# Patient Record
Sex: Female | Born: 1950 | Race: Black or African American | Hispanic: No | Smoking: Former smoker
Health system: Southern US, Community
[De-identification: ages and names within clinical notes are randomized; demographics above are authoritative.]

## PROBLEM LIST (undated history)

## (undated) DIAGNOSIS — D649 Anemia, unspecified: Secondary | ICD-10-CM

## (undated) DIAGNOSIS — E213 Hyperparathyroidism, unspecified: Secondary | ICD-10-CM

## (undated) DIAGNOSIS — E669 Obesity, unspecified: Secondary | ICD-10-CM

## (undated) DIAGNOSIS — F32A Depression, unspecified: Secondary | ICD-10-CM

## (undated) DIAGNOSIS — I739 Peripheral vascular disease, unspecified: Secondary | ICD-10-CM

## (undated) DIAGNOSIS — E785 Hyperlipidemia, unspecified: Secondary | ICD-10-CM

## (undated) DIAGNOSIS — F419 Anxiety disorder, unspecified: Secondary | ICD-10-CM

## (undated) DIAGNOSIS — I10 Essential (primary) hypertension: Secondary | ICD-10-CM

## (undated) DIAGNOSIS — I639 Cerebral infarction, unspecified: Secondary | ICD-10-CM

## (undated) DIAGNOSIS — R41841 Cognitive communication deficit: Secondary | ICD-10-CM

## (undated) DIAGNOSIS — M6281 Muscle weakness (generalized): Secondary | ICD-10-CM

## (undated) DIAGNOSIS — G8190 Hemiplegia, unspecified affecting unspecified side: Secondary | ICD-10-CM

## (undated) DIAGNOSIS — E119 Type 2 diabetes mellitus without complications: Secondary | ICD-10-CM

## (undated) DIAGNOSIS — I5032 Chronic diastolic (congestive) heart failure: Secondary | ICD-10-CM

## (undated) DIAGNOSIS — N189 Chronic kidney disease, unspecified: Secondary | ICD-10-CM

## (undated) HISTORY — PX: HERNIA REPAIR: SHX51

---

## 2000-06-06 ENCOUNTER — Ambulatory Visit: Admission: RE | Admit: 2000-06-06 | Discharge: 2000-06-06 | Payer: Self-pay | Admitting: Family Medicine

## 2007-12-05 ENCOUNTER — Ambulatory Visit: Payer: Self-pay | Admitting: Pulmonary Disease

## 2007-12-05 ENCOUNTER — Inpatient Hospital Stay (HOSPITAL_COMMUNITY): Admission: AD | Admit: 2007-12-05 | Discharge: 2007-12-13 | Payer: Self-pay | Admitting: Internal Medicine

## 2007-12-05 ENCOUNTER — Encounter: Payer: Self-pay | Admitting: Emergency Medicine

## 2007-12-09 ENCOUNTER — Encounter (INDEPENDENT_AMBULATORY_CARE_PROVIDER_SITE_OTHER): Payer: Self-pay | Admitting: *Deleted

## 2007-12-11 ENCOUNTER — Ambulatory Visit: Payer: Self-pay | Admitting: Physical Medicine & Rehabilitation

## 2007-12-13 ENCOUNTER — Inpatient Hospital Stay (HOSPITAL_COMMUNITY)
Admission: RE | Admit: 2007-12-13 | Discharge: 2007-12-26 | Payer: Self-pay | Admitting: Physical Medicine & Rehabilitation

## 2007-12-13 ENCOUNTER — Ambulatory Visit: Payer: Self-pay | Admitting: Physical Medicine & Rehabilitation

## 2008-01-18 ENCOUNTER — Encounter
Admission: RE | Admit: 2008-01-18 | Discharge: 2008-01-18 | Payer: Self-pay | Admitting: Physical Medicine & Rehabilitation

## 2011-05-10 NOTE — Discharge Summary (Signed)
Angela Hays, Angela Hays NO.:  [id number]   MEDICAL RECORD NO.:  [id number]          PATIENT TYPE:  INP   LOCATION:  3032                         FACILITY:  [facility]   PHYSICIAN:  Angela Noel, MD      DATE OF BIRTH:  1951-11-30   DATE OF ADMISSION:  12/05/2007  DATE OF DISCHARGE:                               DISCHARGE SUMMARY   DATE OF CRITICAL CARE SIGN OFF:  December 09, 2007.   DISCHARGE DIAGNOSIS:  1. All hypertensive emergency  2. Ischemic stroke.  3. Diabetes type 2.  4. Debility   CONSULTATIONS:  Neurosurgery with Angela Hays, M.D. and Angela Saas, MD with neurology   LABORATORY DATA:  Date of December 09, 2007 homocystine level 12.50,  hemoglobin at 14.4, hematocrit 44, white blood cell count 9.5, platelet  78, cholesterol 181, LDL 125, sodium 140, potassium 2.8, chloride 102,  CO2 28, glucose 217, BUN 31, creatinine 1.10.  Date of December 07, 2007, TSH 1.99.  Date of December 05, 2007, urine drug screen negative  for any nonprescription or prescription narcotics or benzodiazepines.   RADIOLOGY:  MRI/MRA of brain demonstrating extensive and widespread  chronic small vessel insults throughout the brain.  These were seen  within the pons, thalami, basal ganglia, and hemispheric white matter  diffusely.  There is old stroke in the deep insular cortex on the left  with calcification, volume loss, no evidence of hemorrhage.  MRA  demonstrates widespread intracranial arteriosclerotic irregularity of  the medium sized vessel, most notably the anterior cerebral circulation.   BRIEF HISTORY:  This is a 60 year old female who presented to [facility]  on December 05, 2007 with chief complaint of right greater than left leg  weakness, gait ataxia, and loss of balance.  She first noted these  symptoms on December 02, 2007.  She denied headache, nausea, slurred  speech, or upper extremity strength deficit at that time.  She was  evaluated in [place]  and found to be extremely hypertensive with a  blood pressure of 248/148.  She was initially sent to [facility] for evaluation where a head CT done there questioned possible  left subarachnoid hemorrhage.  She denied drug use and the UDS was  negative.  She was not taking her prescriptive antihypertensives  secondary to financial reasons.  She was transferred to [facility] for neurosurgical evaluation.   PAST MEDICAL HISTORY:  1. Poorly-controlled hypertension.  2. Smoker one pack per day.  3. Chronic right knee pain.  4. Diabetes with question of neuropathy.   HOSPITAL COURSE BY DISCHARGE DIAGNOSIS:  Problem #1:  HYPERTENSIVE URGENCY/EMERGENCY.  [name] was admitted to  the neuro critical care unit, given concerns for acute CVA.  Treatment  consisted initially of nicardipine drip.  Eventually she was  transitioned to oral management.  Upon time of critical care sign off  her antihypertensive regimen consists of Norvasc daily, Catapres b.i.d.,  hydrochlorothiazide daily, and Lopressor b.i.d.Marland Kitchen  Upon time of discharge  her blood pressure is relatively well controlled.  Problem #2:  ISCHEMIC CVA.  (see MRI report above.) This was initially  thought to be intracranial hemorrhage by noncontrasted CT scan.  Neurology and neurosurgery were both consulted.  Followup MRI  demonstrated what was thought to be hemorrhage by CT scan was more  likely calcification.  The stroke team was involved in evaluation  following initial consultation.  She is now on Plavix for significant  arteriosclerosis, changes in the cerebral arteriovascular system.  She  will require home OT/PT versus inpatient rehab.   Problem #2:  DIABETES/HYPERGLYCEMIA.  Plan for this is to continue  sliding scale insulin.   DISPOSITION:  [name] is now being transferred to the [facility]  Internal Medicine Service.   MEDICATIONS UPON TIME OF DISCHARGE CONSIST OF:  1. Norvasc 10 mg p.o. daily.  2.  Catapres 0.1 mg p.o. daily.  3. Plavix 75 mg p.o. daily.  4. Hydrochlorothiazide 25 mg p.o. daily.  5. Sliding scale insulin.  6. Lantus 15 units nightly, 10 units in the morning.  7. Glucophage 500 mg p.o. b.i.d.  8. Lopressor 100 mg p.o. b.i.d.  9. Protonix 40 mg p.o. daily.  10.Supplemental potassium 20 mEq p.o. daily.  11.Hydrochlorothiazide p.r.n.      Angela Dom, NP      Angela Noel, MD  Electronically Signed    PB/MEDQ  D:  12/12/2007  T:  12/12/2007  Job:  [id number]

## 2011-05-10 NOTE — Discharge Summary (Signed)
NAMEAZRA, PANTEL NO.:  [id number]   MEDICAL RECORD NO.:  [id number]          PATIENT TYPE:  IPS   LOCATION:  4036                         FACILITY:  [facility]   PHYSICIAN:  Lauraine Rinne, P.A.  DATE OF BIRTH:  06-Oct-1951   DATE OF ADMISSION:  12/13/2007  DATE OF DISCHARGE:  12/26/2007                               DISCHARGE SUMMARY   DISCHARGE DIAGNOSES:  1. Right cerebrovascular accident.  2. Hypertension.  3. Insulin-dependent diabetes mellitus.  4. Hyperlipidemia.  5. Tobacco abuse.  6. Subcutaneous Lovenox for deep vein thrombosis prophylaxis.   HISTORY:  This is a 60 year old African-American female with history of  hypertension, insulin-dependent diabetes mellitus and with questionable  medical compliance who was admitted on 12/10 with ataxic gait.  Blood  pressure 99991111, diastolic Q000111Q.  MRI showed acute infarction, posterior  limb of internal capsule, not felt to be hemorrhagic.  MRA with  atherosclerotic changes.  Carotid Dopplers negative for internal carotid  artery stenosis.  Echocardiogram with ejection fraction of 60% without  thrombi.  Neurology consulted Dr. Estella Husk and placed on Plavix therapy.  Blood pressure with increased variables, on multiple antihypertensive  medications.  Subcutaneous Lovenox was added for deep vein thrombosis  prophylaxis.  Monitoring of blood sugars with hemoglobin A1c of 10.9 on  admission.   PAST MEDICAL HISTORY:  See discharge diagnoses.   ALLERGIES:  NONE.   SOCIAL HISTORY:  Lives with son, works in housekeeping at [employer].  Son  works day shifts.  Daughter can assist when son is at work.  They live  in a 1-level home.   MEDICATIONS:  Prior to admission were:  1. Insulin.  2. Hydrochlorothiazide.  3. Lopressor.  4. Protonix.  5. Hydralazine.   Doses were not made available and questionable compliance prior to  hospital admission.   REHABILITATION HOSPITAL COURSE:  The patient was admitted to inpatient  rehab services with therapies initiated on a 3-hour daily basis  consisting of physical therapy, occupational therapy, speech therapy and  rehabilitation nursing.  The following issues were addressed during the  patient's rehabilitation stay:  Pertaining to the patient's right  cerebrovascular accident, she remained on Plavix therapy.  Subcutaneous  Lovenox had been initiated for deep vein thrombosis prophylaxis.  She  was minimal assist to supervision for car transfers, minimal assist for  ambulation with a rolling walker, minimal assist bathing and dressing.  She was continent of bowel and bladder.  Full family teaching was  completed.  She will be discharged to home with ongoing home therapies.  Blood pressures remained an ongoing concern while on the rehabilitation  unit with variables of systolic as high as [id number].  Hydrochlorothiazide,  Norvasc, clonidine, lisinopril and Toprol had been added to her regimen.  It was stressed the need to be compliant with these medications as her  home compliance was questionable prior to hospital admission.  She was  to follow up with Dr. Alyson Ingles for her medical management in regards to her  hypertension as well as insulin-dependent diabetes mellitus with that  appointment made by case management on December 31, 2007.  Blood sugars  again monitored.  She is now on Lantus insulin well as Glucophage.  Blood sugars are showing better control at 106, 92 and 127.  It was  noted she had a hemoglobin A1c of 10.9 on admission.  She was placed on  Zocor for hyperlipidemia, cholesterol of 181.  Latest labs showed a  hemoglobin of 13.5, hematocrit 39, platelet count of 206,000.  Chemistries unremarkable.  She was discharged to home.   DISCHARGE MEDICATIONS:  Included:  1. Protonix 40 mg p.o. daily.  2. Hydrochlorothiazide 25 mg p.o. daily.  3. Zocor 20 mg p.o. daily.  4. Norvasc 10 mg p.o. daily.  5. Plavix 75 mg p.o. daily.  6. Lantus insulin 15 units at  bedtime.  7. Potassium chloride 20 mEq daily.  8. Glucophage 850 mg twice daily.  9. Clonidine 0.3 mg patch changed every 7 days.  10.Lisinopril 10 mg daily.  11.Toprol XL 100 mg daily.   DIET:  Diabetic diet.   SPECIAL INSTRUCTIONS:  The patient should follow up Dr. Alger Simons  at the outpatient rehab center as advised and Dr. Alyson Ingles for medical  management at [phone], appointment on Monday, December 31, 2007, at 11:45  a.m.      Lauraine Rinne, P.A.     DA/MEDQ  D:  12/25/2007  T:  12/25/2007  Job:  [id number]   cc:   Meredith Staggers, M.D.  Hulan Saas, MD  Audree Camel. Alyson Ingles, M.D.

## 2011-05-10 NOTE — H&P (Signed)
Angela Hays, Angela Hays NO.:  [id number]   MEDICAL RECORD NO.:  [id number]          PATIENT TYPE:  IPS   LOCATION:  4036                         FACILITY:  [facility]   PHYSICIAN:  Jarvis Morgan, M.D.   DATE OF BIRTH:  [birth year]   DATE OF ADMISSION:  12/13/2007  DATE OF DISCHARGE:                              HISTORY & PHYSICAL   PRIMARY CARE PHYSICIAN:  None.   NEUROLOGIST:  Dr. Estella Husk   HISTORY OF THE PRESENT ILLNESS:  Angela Hays is a 60 year old African  American female with history of hypertension and insulin-dependent  diabetes mellitus.  She was admitted December 05, 2007, with ataxic gait  and blood pressure elevated at 205/150.  MRI study of the brain showed  an acute infarct in the posterior limb of the internal capsule with  severe extension to the corona radiata not felt to be hemorrhagic.  MRA  study showed atherosclerotic changes only.  Carotid Dopplers were  negative for internal carotid artery stenosis.  Echocardiogram showed  ejection fraction of 60% without thrombi.  Dr. Estella Husk from neurology  recommended Plavix daily.  Blood pressure has been monitored on Norvasc,  clonidine and hydrochlorothiazide.  Blood sugars have been up and down  with hemoglobin A1c of 10.9 measured.  She remains on Lantus insulin at  present.   The patient was evaluated by the rehabilitation physicians and felt to  be an appropriate candidate for inpatient rehabilitation.   REVIEW OF SYSTEMS:  Positive for reflux.   PAST MEDICAL HISTORY:  1. Hypertension.  2. Insulin-dependent diabetes mellitus.   FAMILY HISTORY:  Positive for diabetes mellitus.   SOCIAL HISTORY:  The patient lives with her son.  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.  Her son works days.  Her daughter can  assist when the son is at work.  They live in a one-level home with one  step to enter.  The patient reports a remote history of alcohol usage  and positive tobacco usage at  present.   FUNCTIONAL HISTORY PRIOR TO ADMISSION:  Independent.   ALLERGIES:  No known drug allergies.   MEDICATIONS PRIOR ADMISSION:  Doses are incomplete as is compliance but  it appears that she was on insulin, hydrochlorothiazide, Lopressor,  Protonix and hydralazine.   LABORATORY:  Recent hemoglobin was 14.2 with hematocrit of 42.3;  platelet count of 185,000; white count of 11.0.  Recent sodium was 141,  potassium 3.4, chloride 104, bicarbonate 29, BUN 17, and creatinine 0.7,  with total cholesterol 181.  Recent CBGs have been 259, 186 and 145.   PHYSICAL EXAMINATION:  Reasonably well-appearing elderly adult female  lying in bed with obvious left-sided weakness.  Blood pressure is 154/75  with a pulse of 51, respiratory rate 18 and temperature 98.6.  HEENT:  Normocephalic, nontraumatic.  CARDIOVASCULAR:  Regular rate and rhythm.  S1, S2 without murmurs.  ABDOMEN:  Soft, nontender, with positive bowel sounds.  LUNGS:  Clear to auscultation bilaterally.  NEUROLOGICAL:  Alert and oriented x3.  Cranial nerves II-XII are intact.  Right upper and right lower extremity  strength were 5/5.  Bulk and tone  were normal.  Reflexes 2+ and symmetrical.  Left upper extremity  strength was 3/5 to 3+/5 with slightly decreased sensation.  Left lower  extremity strength was 3/5 to 3+/5 with decreased sensation.  Bulk and  tone were normal in the left upper and left lower extremity.   IMPRESSION:  1. Status post right hemisphere stroke with left-sided weakness.  2. Insulin-dependent diabetes mellitus.  3. Hypertension.   At the present time the patient has deficits in ADLs, transfers and  ambulation related to the above-noted right hemisphere stroke with left  hemiparesis.   PLAN:  1. Admit to the rehabilitation area for daily therapies to include      physical therapy for range of motion, strengthening, bed mobility,      transfers, pre-gait training, gait training, and equipment       evaluation.  2. Occupational therapy for range of motion, strengthening, ADLs,      cognitive/perceptual training, splinting and equipment evaluation.  3. Rehab nursing for skin care, wound care, and bowel and bladder      training as necessary.  4. Speech therapy for oral motor exercise, communication aids, and      evaluation of swallow as necessary.  5. Case management to assess home environment, assist with discharge      planning and arrange for appropriate followup care.  6. Social work to assess family and social support and assist in      discharge planning.  7. Continue high-carbohydrate-modified diet.  8. Check admission labs including CBC and CMET in a.m., December 14, 2007.  9. Continue PT, OT and speech therapies.  10.Incentive spirometry q.2h. while awake.  11.CBG a.c. and h.s.  12.Protonix 40 mg p.o. daily.  13.Hydrochlorothiazide 25 mg p.o. daily.  14.Zocor 20 mg p.o. daily.  15.Lantus insulin 15 units subcu q.h.s.  16.Norvasc 10 mg daily.  17.Glucophage 500 mg p.o. b.i.d.  18.Plavix 75 mg p.o. daily.  19.Clonidine 0.1 mg p.o. b.i.d., holding for heart rate less than 55.  20.Potassium chloride 20 mEq p.o. daily.  21.Case management to assist finding primary care physician.  22.Old EKG to chart.  23.Routine turning to prevent skin breakdown.  24.Dulcolax suppository one per rectum daily p.r.n.  25.Toprol-XL 75 mg p.o. daily, holding for heart rate less than 55.   PROGNOSIS:  Good.   ESTIMATED LENGTH OF STAY:  8-15 days   GOALS:  Modified independent ADLs, transfers and ambulation.           ______________________________  Jarvis Morgan, M.D.     DC/MEDQ  D:  12/13/2007  T:  12/14/2007  Job:  [id number]

## 2011-05-10 NOTE — Consult Note (Signed)
NAMEJENAI, Hays NO.:  [id number]   MEDICAL RECORD NO.:  [id number]          PATIENT TYPE:  INP   LOCATION:  3032                         FACILITY:  [facility]   PHYSICIAN:  Shaune Pascal. Champey, M.D.DATE OF BIRTH:  24-Nov-1951   DATE OF CONSULTATION:  DATE OF DISCHARGE:                                 CONSULTATION   REQUESTING PHYSICIAN:  Kary Kos, M.D.   REASON FOR CONSULTATION:  Stroke.   HISTORY OF PRESENT ILLNESS:  Angela Hays is a 60 year old African American  female with a past medical history of hypertension, diabetes who  initially presented with difficulty walking and ataxia, was found to  have elevated blood pressure 205/150.  CT scan showed a hyper density  and subarachnoid hemorrhage in the left head region.  She was placed on  a Cardene drip yesterday.  When trying to move the patient they noticed  that her left side was more weak.  MRI confirmed a right hemispheric  stroke in the internal capsule area.  I was called to evaluate the  patient.  The patient was lying comfortably in bed, feeling well, stated  when she initially came in, she felt like her left leg was weak,  however, it progressed over the last 1-2 days to involve her left upper  extremity as well.  She denies any other symptoms of vision changes,  speech changes, headaches, dizziness, vertigo, or loss of consciousness.   PAST MEDICAL HISTORY:  Positive for:  1. Hypertension.  2. Diabetes.   CURRENT MEDICATIONS:  Insulin, hydrochlorothiazide, Lopressor, Protonix,  hydralazine p.r.n.   ALLERGIES:  No known drug allergies.   FAMILY HISTORY:  Positive for diabetes.   SOCIAL HISTORY:  The patient smokes.  Quit alcohol around 15 years ago.   REVIEW OF SYSTEMS:  Positive as per HPI.  Review of systems negative as  per HPI and greater than 6 other organ systems.   PHYSICAL EXAMINATION:  VITAL SIGNS:  Temperature is 98.1, blood pressure  is 150/47, pulse 69, respirations 16, O2 sat is  98%.  HEENT:  Normocephalic atraumatic.  Extraocular muscles are intact.  Pupils are equal, round, and reactive to light.  NECK:  Supple.  No carotid bruits.  HEART:  Regular.  LUNGS:  Clear.  ABDOMEN:  Soft.  EXTREMITIES:  Show good pulses.  NEUROLOGIC:  The patient is awake, alert, and oriented.  Language is  fluent.  Cranial nerves II-XII are grossly intact.  Motor examination:  The patient has zero to 1 out of 5 strength in the left upper and lower  extremities and good strength on the right.  The patient has decreased  tone on the left.  Sensory examination is within normal limits to light  touch. No neglect is noted.  Reflexes are trace throughout.  Toes are  neutral bilaterally.  Cerebellar function is difficult to assess on the  left secondary to dense hemiplegia.  Gait was not assessed secondary to  safety.   LABORATORY:  TSH is 1.99.  WBC is 13.4, hemoglobin 16.4, hematocrit is  47.8, platelets 201.  Sodium is 138, potassium  is 3.3, chloride is 106,  CO2 is 25, BUN 10, creatinine 0.58, glucose 278, calcium is 9.  MRI MRA  of the brain showed right internal capsule acute infarct, bilateral  small inferior basal ganglion infarcts, diffuse atherosclerosis  irregularity.   IMPRESSION:  A 60 year old with hypertension, diabetes, and multiple  acute infarcts.  The patient is not a candidate for intravenous t-PA  symptoms greater than 3 hours out and questionable subarachnoid  hemorrhage on CT scan.   Recommend getting a 2D echocardiogram to consider a TEE, check carotid  Dopplers, check fasting and homocystine levels, get PT OT consult,  continue current treatment.   PLAN:  We will follow the patient as consultants.      Shaune Pascal. Estella Husk, M.D.  Electronically Signed     DRC/MEDQ  D:  12/08/2007  T:  12/09/2007  Job:  [id number]

## 2011-05-10 NOTE — Discharge Summary (Signed)
Angela Hays, Angela Hays NO.:  [id number]   MEDICAL RECORD NO.:  [id number]          PATIENT TYPE:  INP   LOCATION:  3032                         FACILITY:  [facility]   PHYSICIAN:  Rexene Alberts, M.D.    DATE OF BIRTH:  03/09/1951   DATE OF ADMISSION:  12/05/2007  DATE OF DISCHARGE:  12/13/2007                               DISCHARGE SUMMARY   ADDENDUM:  PLEASE SEE THE DISCHARGE SUMMARY DICTATED BY DR. Elsworth Soho ( VIA  MR. BABCOCK).   This is an addendum covering hospital days from December 10, 2007,  through December 13, 2007.   CONSULTATIONS:  Dr. Theda Sers, rehabilitation physician.   HOSPITAL COURSE:  1. 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      The patient remains stable on Plavix 75 mg daily.  Both the      physical and the occupational therapists evaluated the patient and      felt that she was an appropriate candidate for rehabilitation at      [facility].  Therefore, Dr. Theda Sers was consulted.  He      agreed with the therapists' assessment.  As of today, she has been      approved for transfer to the rehabilitation unit.  2. MALIGNANT HYPERTENSION.  The patient's blood pressure is much      improved although it is not yet optimal.  Her systolic blood      pressures have been ranging in the 150s over the past several days.      At this point, she is being treated with Norvasc, clonidine,      hydrochlorothiazide, and metoprolol.  3. SINUS BRADYCARDIA.  Over the past 24 to 48 hours, the patient's      heart rate has been ranging from 48 beats per minute to 60 beats      per minute.  More than likely, the bradycardia is secondary to      clonidine and metoprolol.  Yesterday, the metoprolol was      discontinued and she was switched to Toprol XL hoping that the      change would decrease the risk of symptomatic bradycardia.  The      patient has no complaints of dizziness, chest pain or palpitations      at this time.  Her  heart rate will need to be monitored closely and      medication adjustments will need to be made accordingly.  4. TYPE 2 DIABETES MELLITUS.  The patient's capillary blood glucose      was initially uncontrolled.  Yesterday, Lantus was increased to      b.i.d. dosing.  She is also being maintained on Glucophage 500 mg      b.i.d. and sliding-scale NovoLog.  This morning, her capillary      blood glucose is 81.  The a.m. Lantus will be discontinued;      however, the q.h.s. Lantus will continue as well as b.i.d. dosing      of metformin.  I doubt that the patient will need sliding scale  NovoLog at home; however, if still applicable during her stay at      the rehab unit, it would be reasonable to continue sliding-scale      NovoLog for capillary blood glucose levels greater than 150.  5. HYPERLIPIDEMIA.  The patient was maintained on Zocor 20 mg daily      during the hospitalization.  6. HYPOKALEMIA.  The patient's serum potassium has been ranging from      3.3 to 3.4 during the hospitalization.  She has been receiving      potassium chloride daily; however, her serum potassium as of today      is still 3.4.  The dose of the potassium chloride will be increased      to 20 mEq t.i.d. for 1 day and then back to b.i.d. dosing.  Her      serum potassium will need to be followed closely while she is in      rehab.   DISCHARGE MEDICATIONS:  1. Norvasc 10 mg daily.  2. Clonidine 0.1 mg b.i.d.  3. Plavix 75 mg daily.  4. Hydrochlorothiazide 25 mg daily.  5. Potassium chloride 20 mEq t.i.d. for 1 day and then back down to 20      mEq b.i.d.  6. Lantus 20 units subcu q.h.s.  7. Sliding scale NovoLog q.a.c. and q.h.s. (while in rehab).  8. Glucophage 500 mg b.i.d.  9. Toprol XL 75 mg b.i.d.  10.Protonix 40 mg daily.  11.Zocor 20 mg daily.   DISCHARGE DISPOSITION:  The patient is in improved and stable condition.  She will be transferred/discharged to the rehabilitation unit at [facility] today.      Rexene Alberts, M.D.  Electronically Signed     DF/MEDQ  D:  12/13/2007  T:  12/13/2007  Job:  [id number]

## 2011-09-30 LAB — COMPREHENSIVE METABOLIC PANEL
Alkaline Phosphatase: 52
Calcium: 9.4
GFR calc Af Amer: >60
GFR calc non Af Amer: >60
Glucose, Bld: 201 — ABNORMAL HIGH
Sodium: 141

## 2011-09-30 LAB — BASIC METABOLIC PANEL
BUN: 16
BUN: 17
CO2: 29
Calcium: 9.7
Creatinine, Ser: 0.7
Creatinine, Ser: 0.72
GFR calc Af Amer: >60
GFR calc Af Amer: >60
GFR calc Af Amer: >60
GFR calc non Af Amer: >60
GFR calc non Af Amer: >60
Potassium: 2.7 — CL
Potassium: 3.4 — ABNORMAL LOW
Sodium: 138
Sodium: 142

## 2011-09-30 LAB — CBC
HCT: 39.9
HCT: 40.1
Hemoglobin: 13.5
Hemoglobin: 14.2
MCHC: 33.6
MCHC: 33.8
MCV: 89.4
MCV: 89.9
Platelets: 185
Platelets: 206
RBC: 4.68
WBC: 10.3
WBC: 10.5

## 2011-09-30 LAB — DIFFERENTIAL
Basophils Absolute: 0
Eosinophils Absolute: 0.2
Lymphs Abs: 2
Monocytes Absolute: 0.7
Neutrophils Relative %: 72

## 2011-10-03 LAB — URINALYSIS, ROUTINE W REFLEX MICROSCOPIC
Glucose, UA: >1000 — AB
Ketones, ur: 40 — AB
Nitrite: NEGATIVE
Protein, ur: 100 — AB
Specific Gravity, Urine: 1.029
Urobilinogen, UA: 1
pH: 6

## 2011-10-03 LAB — TROPONIN I: Troponin I: 0.03

## 2011-10-03 LAB — CBC
HCT: 44
HCT: 47 — ABNORMAL HIGH
Hemoglobin: 14.7
Hemoglobin: 15.9 — ABNORMAL HIGH
Hemoglobin: 16.4 — ABNORMAL HIGH
Hemoglobin: 16.4 — ABNORMAL HIGH
MCHC: 33.7
MCHC: 34.9
MCV: 88.7
MCV: 88.7
MCV: 90.3
Platelets: 198
Platelets: 216
Platelets: 216
RBC: 4.87
RBC: 5.3 — ABNORMAL HIGH
RBC: 5.3 — ABNORMAL HIGH
RBC: 5.39 — ABNORMAL HIGH
RDW: 13.4
RDW: 13.8
WBC: 12.5 — ABNORMAL HIGH
WBC: 12.7 — ABNORMAL HIGH
WBC: 13.4 — ABNORMAL HIGH
WBC: 9.5

## 2011-10-03 LAB — COMPREHENSIVE METABOLIC PANEL
ALT: 11
AST: 12
Albumin: 3.9
Alkaline Phosphatase: 70
BUN: 13
BUN: 15
CO2: 27
CO2: 27
Chloride: 104
Chloride: 107
Creatinine, Ser: 0.69
Glucose, Bld: 332 — ABNORMAL HIGH
Potassium: 3.4 — ABNORMAL LOW
Sodium: 146 — ABNORMAL HIGH
Total Bilirubin: 1.6 — ABNORMAL HIGH
Total Protein: 6.9

## 2011-10-03 LAB — BASIC METABOLIC PANEL
BUN: 10
BUN: 5 — ABNORMAL LOW
CO2: 25
CO2: 31
Calcium: 9.9
Chloride: 102
Creatinine, Ser: 0.58
Creatinine, Ser: 0.66
GFR calc Af Amer: >60
GFR calc Af Amer: >60
GFR calc Af Amer: >60
Glucose, Bld: 278 — ABNORMAL HIGH
Glucose, Bld: 279 — ABNORMAL HIGH
Potassium: 2.8 — ABNORMAL LOW
Potassium: 3.5
Sodium: 139

## 2011-10-03 LAB — APTT: aPTT: 25

## 2011-10-03 LAB — DIFFERENTIAL
Basophils Absolute: 0
Basophils Relative: 0
Eosinophils Absolute: 0 — ABNORMAL LOW
Eosinophils Relative: 0
Lymphocytes Relative: 9 — ABNORMAL LOW
Lymphs Abs: 1.1
Monocytes Absolute: 0.3
Monocytes Relative: 2 — ABNORMAL LOW
Neutro Abs: 11.1 — ABNORMAL HIGH
Neutrophils Relative %: 89 — ABNORMAL HIGH

## 2011-10-03 LAB — LIPID PANEL
HDL: 40
Total CHOL/HDL Ratio: 4.5
Triglycerides: 80
VLDL: 16

## 2011-10-03 LAB — URINE MICROSCOPIC-ADD ON

## 2011-10-03 LAB — COMPREHENSIVE METABOLIC PANEL WITH GFR
AST: 14
Albumin: 3.9
Alkaline Phosphatase: 76
Calcium: 10.1
GFR calc Af Amer: >60
GFR calc non Af Amer: >60
Total Bilirubin: 1.1
Total Protein: 6.8

## 2011-10-03 LAB — URINE DRUGS OF ABUSE SCREEN W ALC, ROUTINE (REF LAB)
Amphetamine Screen, Ur: NEGATIVE
Cocaine Metabolites: NEGATIVE
Propoxyphene: NEGATIVE

## 2011-10-03 LAB — SAMPLE TO BLOOD BANK

## 2011-10-03 LAB — PROTIME-INR
INR: 1
Prothrombin Time: 13

## 2011-10-03 LAB — TSH: TSH: 1.99

## 2016-01-05 ENCOUNTER — Emergency Department (HOSPITAL_COMMUNITY): Payer: Medicare Other

## 2016-01-05 ENCOUNTER — Inpatient Hospital Stay (HOSPITAL_COMMUNITY)
Admission: EM | Admit: 2016-01-05 | Discharge: 2016-01-07 | DRG: 872 | Disposition: A | Discharge status: Home Health | Payer: Medicare Other | Attending: Internal Medicine | Admitting: Internal Medicine

## 2016-01-05 ENCOUNTER — Encounter (HOSPITAL_COMMUNITY): Payer: Self-pay

## 2016-01-05 DIAGNOSIS — M7989 Other specified soft tissue disorders: Secondary | ICD-10-CM | POA: Diagnosis present

## 2016-01-05 DIAGNOSIS — L03115 Cellulitis of right lower limb: Secondary | ICD-10-CM | POA: Diagnosis present

## 2016-01-05 DIAGNOSIS — R600 Localized edema: Secondary | ICD-10-CM | POA: Diagnosis present

## 2016-01-05 DIAGNOSIS — N179 Acute kidney failure, unspecified: Secondary | ICD-10-CM | POA: Diagnosis present

## 2016-01-05 DIAGNOSIS — Z79899 Other long term (current) drug therapy: Secondary | ICD-10-CM | POA: Diagnosis not present

## 2016-01-05 DIAGNOSIS — E876 Hypokalemia: Secondary | ICD-10-CM | POA: Diagnosis present

## 2016-01-05 DIAGNOSIS — I8310 Varicose veins of unspecified lower extremity with inflammation: Secondary | ICD-10-CM

## 2016-01-05 DIAGNOSIS — L97821 Non-pressure chronic ulcer of other part of left lower leg limited to breakdown of skin: Secondary | ICD-10-CM | POA: Diagnosis present

## 2016-01-05 DIAGNOSIS — Z794 Long term (current) use of insulin: Secondary | ICD-10-CM | POA: Diagnosis not present

## 2016-01-05 DIAGNOSIS — A419 Sepsis, unspecified organism: Secondary | ICD-10-CM | POA: Diagnosis not present

## 2016-01-05 DIAGNOSIS — L03116 Cellulitis of left lower limb: Secondary | ICD-10-CM | POA: Diagnosis present

## 2016-01-05 DIAGNOSIS — A599 Trichomoniasis, unspecified: Secondary | ICD-10-CM | POA: Diagnosis present

## 2016-01-05 DIAGNOSIS — Z7902 Long term (current) use of antithrombotics/antiplatelets: Secondary | ICD-10-CM

## 2016-01-05 DIAGNOSIS — E785 Hyperlipidemia, unspecified: Secondary | ICD-10-CM | POA: Diagnosis present

## 2016-01-05 DIAGNOSIS — L089 Local infection of the skin and subcutaneous tissue, unspecified: Secondary | ICD-10-CM

## 2016-01-05 DIAGNOSIS — I872 Venous insufficiency (chronic) (peripheral): Secondary | ICD-10-CM | POA: Diagnosis present

## 2016-01-05 DIAGNOSIS — E1151 Type 2 diabetes mellitus with diabetic peripheral angiopathy without gangrene: Secondary | ICD-10-CM | POA: Diagnosis present

## 2016-01-05 DIAGNOSIS — I160 Hypertensive urgency: Secondary | ICD-10-CM | POA: Diagnosis present

## 2016-01-05 DIAGNOSIS — N390 Urinary tract infection, site not specified: Secondary | ICD-10-CM | POA: Diagnosis present

## 2016-01-05 DIAGNOSIS — Z8249 Family history of ischemic heart disease and other diseases of the circulatory system: Secondary | ICD-10-CM | POA: Diagnosis not present

## 2016-01-05 DIAGNOSIS — R7989 Other specified abnormal findings of blood chemistry: Secondary | ICD-10-CM

## 2016-01-05 DIAGNOSIS — E870 Hyperosmolality and hypernatremia: Secondary | ICD-10-CM | POA: Diagnosis present

## 2016-01-05 DIAGNOSIS — I10 Essential (primary) hypertension: Secondary | ICD-10-CM

## 2016-01-05 DIAGNOSIS — L97811 Non-pressure chronic ulcer of other part of right lower leg limited to breakdown of skin: Secondary | ICD-10-CM | POA: Diagnosis present

## 2016-01-05 DIAGNOSIS — Z8673 Personal history of transient ischemic attack (TIA), and cerebral infarction without residual deficits: Secondary | ICD-10-CM

## 2016-01-05 DIAGNOSIS — L8990 Pressure ulcer of unspecified site, unspecified stage: Secondary | ICD-10-CM

## 2016-01-05 DIAGNOSIS — L98499 Non-pressure chronic ulcer of skin of other sites with unspecified severity: Secondary | ICD-10-CM

## 2016-01-05 HISTORY — DX: Essential (primary) hypertension: I10

## 2016-01-05 HISTORY — DX: Cerebral infarction, unspecified: I63.9

## 2016-01-05 HISTORY — DX: Type 2 diabetes mellitus without complications: E11.9

## 2016-01-05 LAB — URINALYSIS, ROUTINE W REFLEX MICROSCOPIC
BILIRUBIN URINE: NEGATIVE
GLUCOSE, UA: NEGATIVE mg/dL
HGB URINE DIPSTICK: NEGATIVE
Ketones, ur: NEGATIVE mg/dL
Nitrite: NEGATIVE
PROTEIN: NEGATIVE mg/dL
SPECIFIC GRAVITY, URINE: 1.012 (ref 1.005–1.030)
pH: 6 (ref 5.0–8.0)

## 2016-01-05 LAB — COMPREHENSIVE METABOLIC PANEL
ALT: 24 U/L (ref 14–54)
AST: 30 U/L (ref 15–41)
Albumin: 3.8 g/dL (ref 3.5–5.0)
Alkaline Phosphatase: 51 U/L (ref 38–126)
Anion gap: 17 — ABNORMAL HIGH (ref 5–15)
BUN: 52 mg/dL — ABNORMAL HIGH (ref 6–20)
CHLORIDE: 101 mmol/L (ref 101–111)
CO2: 29 mmol/L (ref 22–32)
CREATININE: 1.43 mg/dL — AB (ref 0.44–1.00)
Calcium: 10.2 mg/dL (ref 8.9–10.3)
GFR, EST AFRICAN AMERICAN: 43 mL/min — AB (ref >60)
GFR, EST NON AFRICAN AMERICAN: 37 mL/min — AB (ref >60)
Glucose, Bld: 112 mg/dL — ABNORMAL HIGH (ref 65–99)
Potassium: 3.6 mmol/L (ref 3.5–5.1)
SODIUM: 147 mmol/L — AB (ref 135–145)
TOTAL PROTEIN: 7.6 g/dL (ref 6.5–8.1)
Total Bilirubin: 0.9 mg/dL (ref 0.3–1.2)

## 2016-01-05 LAB — URINE MICROSCOPIC-ADD ON

## 2016-01-05 LAB — I-STAT CG4 LACTIC ACID, ED
LACTIC ACID, VENOUS: 2.37 mmol/L — AB (ref 0.5–2.0)
Lactic Acid, Venous: 0.99 mmol/L (ref 0.5–2.0)

## 2016-01-05 LAB — GLUCOSE, CAPILLARY: GLUCOSE-CAPILLARY: 181 mg/dL — AB (ref 65–99)

## 2016-01-05 LAB — PROCALCITONIN: Procalcitonin: <0.1 ng/mL

## 2016-01-05 LAB — CBC
HCT: 42.9 % (ref 36.0–46.0)
Hemoglobin: 13.5 g/dL (ref 12.0–15.0)
MCH: 29.1 pg (ref 26.0–34.0)
MCHC: 31.5 g/dL (ref 30.0–36.0)
MCV: 92.5 fL (ref 78.0–100.0)
PLATELETS: 345 10*3/uL (ref 150–400)
RBC: 4.64 MIL/uL (ref 3.87–5.11)
RDW: 14.3 % (ref 11.5–15.5)
WBC: 10.8 10*3/uL — AB (ref 4.0–10.5)

## 2016-01-05 LAB — BRAIN NATRIURETIC PEPTIDE: B NATRIURETIC PEPTIDE 5: 53.7 pg/mL (ref 0.0–100.0)

## 2016-01-05 LAB — PROTIME-INR
INR: 1.15 (ref 0.00–1.49)
Prothrombin Time: 14.9 seconds (ref 11.6–15.2)

## 2016-01-05 LAB — APTT: aPTT: 29 seconds (ref 24–37)

## 2016-01-05 LAB — TSH: TSH: 1.098 u[IU]/mL (ref 0.350–4.500)

## 2016-01-05 LAB — LACTIC ACID, PLASMA
LACTIC ACID, VENOUS: 1 mmol/L (ref 0.5–2.0)
Lactic Acid, Venous: 1.4 mmol/L (ref 0.5–2.0)

## 2016-01-05 MED ORDER — FUROSEMIDE 40 MG PO TABS
80.0000 mg | ORAL_TABLET | Freq: Every day | ORAL | Status: DC
  Administered 2016-01-05 – 2016-01-07 (×2): 80 mg via ORAL
  Filled 2016-01-05 (×2): qty 2

## 2016-01-05 MED ORDER — ENOXAPARIN SODIUM 40 MG/0.4ML ~~LOC~~ SOLN
40.0000 mg | SUBCUTANEOUS | Status: DC
  Administered 2016-01-05 – 2016-01-06 (×2): 40 mg via SUBCUTANEOUS
  Filled 2016-01-05 (×2): qty 0.4

## 2016-01-05 MED ORDER — INSULIN GLARGINE 100 UNIT/ML ~~LOC~~ SOLN
15.0000 [IU] | Freq: Every evening | SUBCUTANEOUS | Status: DC
  Administered 2016-01-05 – 2016-01-06 (×2): 15 [IU] via SUBCUTANEOUS
  Filled 2016-01-05 (×2): qty 0.15

## 2016-01-05 MED ORDER — CLONIDINE HCL 0.1 MG PO TABS
0.2000 mg | ORAL_TABLET | Freq: Three times a day (TID) | ORAL | Status: DC
  Administered 2016-01-05 – 2016-01-07 (×5): 0.2 mg via ORAL
  Filled 2016-01-05 (×5): qty 2

## 2016-01-05 MED ORDER — HYDROCHLOROTHIAZIDE 25 MG PO TABS
25.0000 mg | ORAL_TABLET | Freq: Every day | ORAL | Status: DC
  Administered 2016-01-05 – 2016-01-07 (×2): 25 mg via ORAL
  Filled 2016-01-05 (×2): qty 1

## 2016-01-05 MED ORDER — HYDROCODONE-ACETAMINOPHEN 5-325 MG PO TABS: 1.0000 | ORAL_TABLET | ORAL | Status: DC | PRN

## 2016-01-05 MED ORDER — PIPERACILLIN-TAZOBACTAM 3.375 G IVPB 30 MIN
3.3750 g | Freq: Once | INTRAVENOUS | Status: DC
Start: 2016-01-05 — End: 2016-01-05

## 2016-01-05 MED ORDER — VANCOMYCIN HCL IN DEXTROSE 1-5 GM/200ML-% IV SOLN
1000.0000 mg | Freq: Once | INTRAVENOUS | Status: DC
Start: 2016-01-05 — End: 2016-01-05

## 2016-01-05 MED ORDER — VANCOMYCIN HCL IN DEXTROSE 750-5 MG/150ML-% IV SOLN
750.0000 mg | Freq: Two times a day (BID) | INTRAVENOUS | Status: DC
  Administered 2016-01-06 – 2016-01-07 (×3): 750 mg via INTRAVENOUS
  Filled 2016-01-05 (×4): qty 150

## 2016-01-05 MED ORDER — FUROSEMIDE 40 MG PO TABS: 80.0000 mg | ORAL_TABLET | Freq: Every day | ORAL | Status: DC

## 2016-01-05 MED ORDER — SODIUM CHLORIDE 0.9 % IJ SOLN
3.0000 mL | Freq: Two times a day (BID) | INTRAMUSCULAR | Status: DC
  Administered 2016-01-06: 3 mL via INTRAVENOUS

## 2016-01-05 MED ORDER — PIPERACILLIN-TAZOBACTAM 3.375 G IVPB
3.3750 g | Freq: Three times a day (TID) | INTRAVENOUS | Status: DC
  Administered 2016-01-05 – 2016-01-07 (×5): 3.375 g via INTRAVENOUS
  Filled 2016-01-05 (×5): qty 50

## 2016-01-05 MED ORDER — CLOPIDOGREL BISULFATE 75 MG PO TABS
75.0000 mg | ORAL_TABLET | Freq: Every day | ORAL | Status: DC
  Administered 2016-01-05 – 2016-01-07 (×3): 75 mg via ORAL
  Filled 2016-01-05 (×3): qty 1

## 2016-01-05 MED ORDER — HYDROCERIN EX CREA
TOPICAL_CREAM | Freq: Every day | CUTANEOUS | Status: DC
  Administered 2016-01-05 – 2016-01-06 (×2): via TOPICAL
  Filled 2016-01-05: qty 113

## 2016-01-05 MED ORDER — ONDANSETRON HCL 4 MG/2ML IJ SOLN: 4.0000 mg | Freq: Four times a day (QID) | INTRAMUSCULAR | Status: DC | PRN

## 2016-01-05 MED ORDER — SODIUM CHLORIDE 0.9 % IV BOLUS (SEPSIS)
1000.0000 mL | Freq: Once | INTRAVENOUS | Status: AC
  Administered 2016-01-05: 1000 mL via INTRAVENOUS

## 2016-01-05 MED ORDER — PIPERACILLIN-TAZOBACTAM 3.375 G IVPB 30 MIN
3.3750 g | Freq: Once | INTRAVENOUS | Status: AC
  Administered 2016-01-05: 3.375 g via INTRAVENOUS
  Filled 2016-01-05: qty 50

## 2016-01-05 MED ORDER — POTASSIUM CHLORIDE CRYS ER 20 MEQ PO TBCR
20.0000 meq | EXTENDED_RELEASE_TABLET | Freq: Every day | ORAL | Status: DC
  Administered 2016-01-05 – 2016-01-07 (×2): 20 meq via ORAL
  Filled 2016-01-05 (×2): qty 1

## 2016-01-05 MED ORDER — HYDRALAZINE HCL 10 MG PO TABS
10.0000 mg | ORAL_TABLET | Freq: Three times a day (TID) | ORAL | Status: DC
  Administered 2016-01-05 – 2016-01-07 (×5): 10 mg via ORAL
  Filled 2016-01-05 (×5): qty 1

## 2016-01-05 MED ORDER — SODIUM CHLORIDE 0.9 % IV SOLN
INTRAVENOUS | Status: DC
  Administered 2016-01-05: 19:00:00 via INTRAVENOUS

## 2016-01-05 MED ORDER — LABETALOL HCL 100 MG PO TABS
200.0000 mg | ORAL_TABLET | Freq: Two times a day (BID) | ORAL | Status: DC
  Administered 2016-01-05 – 2016-01-07 (×4): 200 mg via ORAL
  Filled 2016-01-05 (×4): qty 2

## 2016-01-05 MED ORDER — INSULIN ASPART 100 UNIT/ML ~~LOC~~ SOLN
0.0000 [IU] | Freq: Three times a day (TID) | SUBCUTANEOUS | Status: DC
  Administered 2016-01-06: 5 [IU] via SUBCUTANEOUS
  Administered 2016-01-06 (×2): 2 [IU] via SUBCUTANEOUS

## 2016-01-05 MED ORDER — SODIUM CHLORIDE 0.9 % IV SOLN
INTRAVENOUS | Status: DC
  Administered 2016-01-05: 10:00:00 via INTRAVENOUS

## 2016-01-05 MED ORDER — SIMVASTATIN 20 MG PO TABS
20.0000 mg | ORAL_TABLET | Freq: Every day | ORAL | Status: DC
  Administered 2016-01-05 – 2016-01-07 (×2): 20 mg via ORAL
  Filled 2016-01-05 (×2): qty 1

## 2016-01-05 MED ORDER — VANCOMYCIN HCL IN DEXTROSE 1-5 GM/200ML-% IV SOLN
1000.0000 mg | Freq: Once | INTRAVENOUS | Status: AC
  Administered 2016-01-05: 1000 mg via INTRAVENOUS
  Filled 2016-01-05: qty 200

## 2016-01-05 MED ORDER — ONDANSETRON HCL 4 MG PO TABS: 4.0000 mg | ORAL_TABLET | Freq: Four times a day (QID) | ORAL | Status: DC | PRN

## 2016-01-05 MED ORDER — DOXAZOSIN MESYLATE 4 MG PO TABS
8.0000 mg | ORAL_TABLET | Freq: Every day | ORAL | Status: DC
  Administered 2016-01-05 – 2016-01-06 (×2): 8 mg via ORAL
  Filled 2016-01-05 (×2): qty 2

## 2016-01-05 MED ORDER — NUTRA-SUPPORT BONE PO CAPS: 1.0000 | ORAL_CAPSULE | Freq: Every day | ORAL | Status: DC

## 2016-01-05 MED ORDER — HYDRALAZINE HCL 20 MG/ML IJ SOLN
5.0000 mg | INTRAMUSCULAR | Status: DC | PRN
  Administered 2016-01-05: 5 mg via INTRAVENOUS
  Filled 2016-01-05: qty 1

## 2016-01-05 NOTE — ED Notes (Signed)
Upon assessment, numerous ulcerations noted to BLE.  Pt reports that she can ambulate w/ assistance.  Sts she normally can ambulate w/o difficulty.  Denies n/v/d.  Pt reports that she manages her diabetes well.

## 2016-01-05 NOTE — Progress Notes (Signed)
PHARMACIST - PHYSICIAN ORDER COMMUNICATION  CONCERNING: P&T Medication Policy on Herbal Medications  DESCRIPTION:  This patient's order for:  Nutra-Support Bone Caps  has been noted.  This product(s) is classified as an "herbal" or natural product. Due to a lack of definitive safety studies or FDA approval, nonstandard manufacturing practices, plus the potential risk of unknown drug-drug interactions while on inpatient medications, the Pharmacy and Therapeutics Committee does not permit the use of "herbal" or natural products of this type within [facility]   ACTION TAKEN: The pharmacy department is unable to verify this order at this time and your patient has been informed of this safety policy. Please reevaluate patient's clinical condition at discharge and address if the herbal or natural product(s) should be resumed at that time.  Romeo Rabon, PharmD, pager [phone]. 01/05/2016,6:28 PM.

## 2016-01-05 NOTE — ED Notes (Signed)
All belongings (nightgowns x 2 and crossword book) taken to Inpatient room.

## 2016-01-05 NOTE — Consult Note (Signed)
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  Now with progressive swelling, erythema, warmth and weakness in the LEs. Wound type:Venous insufficiency with ulceration and suspected infection i.e., cellulitis. Suspected lymphedema on left LE. Pressure Ulcer POA: No Measurement:Left LE with two areas of involvement: Posterior measures 10cm x 11cm with 0.2cm depth in most places.  Thin layer of macerated grey tissue obscures depth in some areas. Anterior wound measures 10cm x 13cm x 0.2cm with similar presentation to posterior wound.  Surrounding tissue is red, warm.   Right LE with more chronic wounds: 3 areas of wounds: lateral measures 8cm x 2cm and is an elevated area of dried serum (scab). Anterior wound measures 10cm x 4cm x 0.2cm and is macerated with denuded areas.  Medial wound measures 8cm x 3cm and presents with 80% thin, grey macerated tissue and 20% pink, moist wound bed. No evidence of erythema, warmth or induration on the RLE.  Wound bed: some denuded moist tissues with pink base and other elevated, crusted areas with dried serum (scabs). Drainage (amount, consistency, odor) serous exudate with musty odor from open areas on LEs Periwound:dry, flaking tissue. Erythema surrounding left LE ulcers. Dressing procedure/placement/frequency: Daily cleansing with pH balanced, no rinse cleanser to be followed by gently patting tissue dry.  Eucerin moisturizing cream to be applied following cleansing, then open areas topped with xeroform gauze.  These will be covered with ABD (absorbant) dressings and secured with Kerlix gauze wraps from toes to knees, topped with 6-inch ACE bandages wrapped in the same manner. Legs are to be elevated and heel floated off the bed surface while in bed via the use of pressure redistribution heel boots.  I suggest a PT evaluation for reports of decreasing LE strength sufficient enough to make her a fall risk and adaptive equipment  assessment. [place] nursing team will not follow, but will remain available to this patient, the nursing and medical teams.  Please re-consult if needed. Thanks, Maudie Flakes, MSN, RN, [place], Arther Abbott  Pager# [phone]

## 2016-01-05 NOTE — Progress Notes (Signed)
Pt very pleasant states her pcp is robert reade EPIC updated

## 2016-01-05 NOTE — ED Notes (Signed)
Eucerin cream applied moderately to bilateral lower legs and wrapped w/ gauze.

## 2016-01-05 NOTE — ED Notes (Signed)
Bed: [id number] Expected date:  Expected time:  Means of arrival:  Comments: Ems- 65 yo F, leaking fluid from legs

## 2016-01-05 NOTE — ED Notes (Signed)
Per Lattie Haw NS, Pt's room is still not ready.  4th Flr Charge will call w/ update.

## 2016-01-05 NOTE — H&P (Addendum)
[facility] History and Physical  Angela Hays [id number] DOB: 1951/07/29 DOA: 01/05/2016  Referring physician: ED physician, Dr. Ashok Cordia  PCP: Pt does not know the name  Chief Complaint: LE swelling  HPI:  Pt is 65 yo female with HTN, DM, HLD, presented to [facility] ED with main concern of progressively worsening LE swelling and development of ulcers due to fluid. Please note that pt is rather poor historian and no family present at bedside. Pt says her son takes care of her and he would know how to answer the questions. Pt denies fevers, chills, no chest pain or shortness of breath, no abd or urinary concerns. Pt reports she thinks that she saw some brownish fluid coming from the wounds and she does not receive any routine wound care.   In ED, pt noted to be hemodynamically stable, VS notable for HR up to 120, BP up to 197/102. Blood work notable for WBC 10 and Na 147. Pt started on fluids and TRH asked to admit for further evaluation.   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 - Left LE with two areas Posterior 10cm x 11cm with 0.2c, anterior wound 10cm x 13cm x 0.2cm with similar presentation  - Right LE with 3 areas lateral  8cm x 2cm, ant. Wound 10cm x 4cm x 0.2cm, medial  8cm x 3cm a - Erythema surrounding these wounds  - WOC recommends daily cleansing with pH balanced, no rinse cleanser to be followed by gently patting tissue dry. Eucerin moisturizing cream to be applied following cleansing, then open areas topped with xeroform gauze.  - also legs are to be elevated and heel floated off the bed surface while in bed via the use of pressure redistribution heel boots - appreciate assistance   Active Problems:   Sepsis ([place]) due to cellulitis in LE's - broad spectrum ABX - lactic acid stabilized post IVF  - narrow down as clinically indicated      Hypertensive urgency - continue home medical regimen - added Hydralazine scheduled and as needed for better control     DM (diabetes mellitus), type 2 with peripheral vascular complications ([place]) - continue home medical regimen and add SSI     Acute kidney injury  - given fluids in ED but due to significant edema, will hold IVF for now - repeat BMP in AM    Hypernatremia - given IVF in ED, repeat BMP In AM  Lovenox SQ for DVT prophylaxis   Radiological Exams on Admission: Dg Chest Port 1 View 01/05/2016   No active disease.  Code Status: Full Family Communication: Pt at bedside Disposition Plan: Admit for further evaluation    [name] [name] [id number]  Review of Systems:  Constitutional:  Negative for diaphoresis.  HENT: Negative for hearing loss, ear pain, nosebleeds, congestion, sore throat, neck pain, tinnitus and ear discharge.   Eyes: Negative for blurred vision, double vision, photophobia, pain, discharge and redness.  Respiratory: Negative for cough, wheezing and stridor.   Cardiovascular: Negative for chest pain, palpitations, orthopnea Gastrointestinal: Negative for nausea, vomiting and abdominal pain.  Genitourinary: Negative for dysuria, urgency, frequency, hematuria and flank pain.  Musculoskeletal: Negative for myalgias, back pain, joint pain and falls.  Skin: Negative for itching and rash.  Neurological: Negative for dizziness and weakness. Negative for tingling, tremors, sensory change, speech change Endo/Heme/Allergies: Negative for environmental allergies and polydipsia.  Does not bruise/bleed easily.  Psychiatric/Behavioral: Negative for suicidal ideas. The patient is not nervous/anxious.      Past Medical History  Diagnosis Date  . Hypertension   . Stroke ([place])   . Diabetes mellitus without complication ([place])      Social History:  reports that she has never smoked. She does not have any smokeless tobacco history on file. She reports that she does  not drink alcohol or use illicit drugs.  Allergies no known allergies  Family history of HTN   Medication Sig  amLODipine (NORVASC) 10 MG tablet Take 5 mg by mouth daily with breakfast.  cloNIDine (CATAPRES) 0.2 MG tablet Take 0.2 mg by mouth 3 (three) times daily.  clopidogrel (PLAVIX) 75 MG tablet Take 75 mg by mouth daily.  doxazosin (CARDURA) 8 MG tablet Take 8 mg by mouth at bedtime.  furosemide (LASIX) 40 MG tablet Take 80 mg by mouth daily with breakfast.  hydrochlorothiazide (HYDRODIURIL) 25 MG tablet Take 25 mg by mouth daily with breakfast.  insulin glargine (LANTUS) 100 UNIT/ML injection Inject 15 Units into the skin every evening.  labetalol (NORMODYNE) 200 MG tablet Take 200 mg by mouth 2 (two) times daily.  metFORMIN (GLUCOPHAGE) 850 MG tablet Take 850 mg by mouth 2 (two) times daily with a meal.  Multiple Minerals-Vitamins (NUTRA-SUPPORT BONE) CAPS Take 1 tablet by mouth daily.  potassium chloride SA (K-DUR,KLOR-CON) 20 MEQ tablet Take 20 mEq by mouth daily with breakfast.  simvastatin (ZOCOR) 20 MG tablet Take 20 mg by mouth daily with breakfast.  valsartan (DIOVAN) 320 MG tablet Take 320 mg by mouth daily with breakfast.    Physical Exam: Filed Vitals:   01/05/16 0932 01/05/16 1011 01/05/16 1100 01/05/16 1200  BP: 197/102 181/93 171/80 197/87  Pulse: 108 105 105 112  Temp: 98.1 F (36.7 C)     TempSrc: Oral     Resp: 16 21 24 25   SpO2: 95% 99% 96% 99%    Physical Exam  Constitutional: Appears well-developed and well-nourished. No distress.  HENT: Normocephalic. External right and left ear normal. Oropharynx is clear and moist.  Eyes: Conjunctivae and EOM are normal. PERRLA, no scleral icterus.  Neck: Normal ROM. Neck supple. No JVD. No tracheal deviation. No thyromegaly.  CVS: RRR, S1/S2 +, no murmurs, no gallops, no carotid bruit.  Pulmonary: Effort and breath sounds normal, no stridor, rhonchi, wheezes, rales.  Abdominal: Soft. BS +,  no distension,  tenderness, rebound or guarding.  Musculoskeletal: Normal range of motion. No edema and no tenderness.  Lymphadenopathy: No lymphadenopathy noted, cervical, inguinal. Neuro: Alert. Normal reflexes, muscle tone coordination. No cranial nerve deficit. Skin: Skin is warm and dry. No rash noted. Not diaphoretic. No erythema. No pallor.  Psychiatric: Normal mood and affect. Behavior, judgment, thought content normal.   Labs on Admission:  Basic Metabolic Panel:  Recent Labs Lab 01/05/16 1029  NA 147*  K 3.6  CL 101  CO2 29  GLUCOSE 112*  BUN 52*  CREATININE 1.43*  CALCIUM 10.2   Liver Function Tests:  Recent Labs Lab 01/05/16 1029  AST 30  ALT 24  ALKPHOS 51  BILITOT 0.9  PROT 7.6  ALBUMIN 3.8  CBC:  Recent Labs Lab 01/05/16 1029  WBC 10.8*  HGB 13.5  HCT 42.9  MCV 92.5  PLT 345    EKG: pending   If 7PM-7AM, please contact night-coverage [email or web address] Password [facility] 01/05/2016, 12:31 PM

## 2016-01-05 NOTE — ED Notes (Signed)
Abnormal result given to Dr. Ashok Cordia

## 2016-01-05 NOTE — Progress Notes (Signed)
ANTIBIOTIC CONSULT NOTE - INITIAL  Pharmacy Consult for vancomycin and zosyn Indication: cellulitis  Allergies no known allergies  Patient Measurements: Height: 5\' 7"  (170.2 cm) Weight: 173 lb (78.472 kg) IBW/kg (Calculated) : 61.6   Vital Signs: Temp: 98.1 F (36.7 C) (01/10 0932) Temp Source: Oral (01/10 0932) BP: 197/87 mmHg (01/10 1200) Pulse Rate: 112 (01/10 1200) Intake/Output from previous day:   Intake/Output from this shift:    Labs:  Recent Labs  01/05/16 1029  WBC 10.8*  HGB 13.5  PLT 345  CREATININE 1.43*   Estimated Creatinine Clearance: 41.8 mL/min (by C-G formula based on Cr of 1.43). No results for input(s): VANCOTROUGH, VANCOPEAK, VANCORANDOM, GENTTROUGH, GENTPEAK, GENTRANDOM, TOBRATROUGH, TOBRAPEAK, TOBRARND, AMIKACINPEAK, AMIKACINTROU, AMIKACIN in the last 72 hours.   Microbiology: No results found for this or any previous visit (from the past 720 hour(s)).  Medical History: Past Medical History  Diagnosis Date  . Hypertension   . Stroke ([place])   . Diabetes mellitus without complication [facility])    Assessment: 65 y.o. female with hx htn, presents with worsening swelling of bilateral lower legs and feet, and worsening appearance of chronic leg wounds/ulcers. Patient is poor/limited historian. Pt unsure how long legs swollen and/or how long looking worse. Denies any specific wound care. States lives w son. Pt has been noted in past week to have brownish fluid weeping from legs, and has been non ambulatory due to swelling. No fever or chills. No orthopnea or pnd. No chest pain or discomfort.  Patient received zosyn 3.375mg  IV x 1 and vanc 1gm IV x 1 in the ED   Goal of Therapy:  Will use higher goal of Vancomycin trough level 15-20  Plan:  Zosyn 3.375g IV Q8H infused over 4hrs. Vancomycin 750mg  IV q12h Follow up renal function, cultures, clinical course vanc trough at steady state as needed  Dolly Rias RPh 01/05/2016, 1:59 PM Pager  [phone]

## 2016-01-05 NOTE — ED Notes (Addendum)
Pt's son: [name]- [phone] or [phone].

## 2016-01-05 NOTE — Progress Notes (Signed)
Spoke with ED unit secretary Lattie Haw, [place] care nurse paged and voice message left

## 2016-01-05 NOTE — ED Notes (Signed)
Per EMS, Pt, from home, c/o BLE swelling and weeping x 2 weeks.  Denies pain.  3+ pitting edema noted.  Brown fluid noted to weeping from legs.  EMS reports that skin is coming off also.  Pt is typically ambulatory.  A & Ox4.

## 2016-01-05 NOTE — ED Provider Notes (Signed)
CSN: [id number]     Arrival date & time 01/05/16  [id number] History   First MD Initiated Contact with Patient 01/05/16 [phone]     Chief Complaint  Patient presents with  . Leg Swelling  . Wound Check     (Consider location/radiation/quality/duration/timing/severity/associated sxs/prior Treatment) Patient is a 65 y.o. female presenting with wound check. The history is provided by the patient and the EMS personnel.  Wound Check Pertinent negatives include no chest pain, no abdominal pain, no headaches and no shortness of breath.  Patient w hx htn, presents w worsening swelling of bilateral lower legs and feet, and worsening appearance of chronic leg wounds/ulcers.  Patient is poor/limited historian.  Pt unsure how long legs swollen and/or how long looking worse. Denies any specific wound care. States lives w son.  Pt has been noted in past week to have brownish fluid weeping from legs, and has been non ambulatory due to swelling.  No fever or chills. No orthopnea or pnd. No chest pain or discomfort.      Past Medical History  Diagnosis Date  . Hypertension   . Stroke ([place])   . Diabetes mellitus without complication ([place])    History reviewed. No pertinent past surgical history. History reviewed. No pertinent family history. Social History  Substance Use Topics  . Smoking status: Never Smoker   . Smokeless tobacco: None  . Alcohol Use: No   OB History    No data available     Review of Systems  Constitutional: Negative for fever and chills.  HENT: Negative for sore throat.   Eyes: Negative for redness.  Respiratory: Negative for shortness of breath.   Cardiovascular: Positive for leg swelling. Negative for chest pain.  Gastrointestinal: Negative for vomiting, abdominal pain and diarrhea.  Genitourinary: Negative for flank pain.  Musculoskeletal: Negative for back pain and neck pain.  Skin: Negative for rash.  Neurological: Negative for headaches.  Hematological: Does not  bruise/bleed easily.  Psychiatric/Behavioral: Negative for confusion.      Allergies  Review of patient's allergies indicates no known allergies.  Home Medications   Prior to Admission medications   Not on File   BP 197/102 mmHg  Pulse 108  Temp(Src) 98.1 F (36.7 C) (Oral)  Resp 16  SpO2 95% Physical Exam  Constitutional: She appears well-developed and well-nourished. No distress.  HENT:  Mouth/Throat: Oropharynx is clear and moist.  Eyes: Conjunctivae are normal. No scleral icterus.  Neck: Neck supple. No tracheal deviation present.  Cardiovascular: Regular rhythm, normal heart sounds and intact distal pulses.   Mildly tachycardic.   Pulmonary/Chest: Effort normal and breath sounds normal. No respiratory distress.  Abdominal: Soft. Normal appearance. She exhibits no distension. There is no tenderness.  Genitourinary:  No cva tenderness  Musculoskeletal: She exhibits edema.  3+ bilateral foot/ankle/lowere leg edema to knees. Pt with superficial ulcerations to lower legs esp left. Foul smelling, yellowish/brown discharge from wounds. Surrounding cellulitis on left.   Neurological: She is alert.  Skin: Skin is warm and dry. No rash noted. She is not diaphoretic.  Psychiatric: She has a normal mood and affect.  Nursing note and vitals reviewed.   ED Course  Procedures (including critical care time) Labs Review  Results for orders placed or performed during the hospital encounter of 01/05/16  CBC  Result Value Ref Range   WBC 10.8 (H) 4.0 - 10.5 K/uL   RBC 4.64 3.87 - 5.11 MIL/uL   Hemoglobin 13.5 12.0 - 15.0 g/dL   HCT  42.9 36.0 - 46.0 %   MCV 92.5 78.0 - 100.0 fL   MCH 29.1 26.0 - 34.0 pg   MCHC 31.5 30.0 - 36.0 g/dL   RDW 14.3 11.5 - 15.5 %   Platelets 345 150 - 400 K/uL  Comprehensive metabolic panel  Result Value Ref Range   Sodium 147 (H) 135 - 145 mmol/L   Potassium 3.6 3.5 - 5.1 mmol/L   Chloride 101 101 - 111 mmol/L   CO2 29 22 - 32 mmol/L    Glucose, Bld 112 (H) 65 - 99 mg/dL   BUN 52 (H) 6 - 20 mg/dL   Creatinine, Ser 1.43 (H) 0.44 - 1.00 mg/dL   Calcium 10.2 8.9 - 10.3 mg/dL   Total Protein 7.6 6.5 - 8.1 g/dL   Albumin 3.8 3.5 - 5.0 g/dL   AST 30 15 - 41 U/L   ALT 24 14 - 54 U/L   Alkaline Phosphatase 51 38 - 126 U/L   Total Bilirubin 0.9 0.3 - 1.2 mg/dL   GFR calc non Af Amer 37 (L) >60 mL/min   GFR calc Af Amer 43 (L) >60 mL/min   Anion gap 17 (H) 5 - 15  Brain natriuretic peptide  Result Value Ref Range   B Natriuretic Peptide 53.7 0.0 - 100.0 pg/mL  I-Stat CG4 Lactic Acid, ED  Result Value Ref Range   Lactic Acid, Venous 2.37 (HH) 0.5 - 2.0 mmol/L   Comment NOTIFIED PHYSICIAN    Dg Chest Port 1 View  01/05/2016  CLINICAL DATA:  Bilateral lower extremity swelling, weeping. EXAM: PORTABLE CHEST 1 VIEW COMPARISON:  12/05/2007 FINDINGS: The heart size and mediastinal contours are within normal limits. Both lungs are clear. The visualized skeletal structures are unremarkable. IMPRESSION: No active disease. Electronically Signed   By: Rolm Baptise M.D.   On: 01/05/2016 10:09      I have personally reviewed and evaluated these images and lab results as part of my medical decision-making.   EKG Interpretation   Date/Time:  Tuesday January 05 2016 10:09:52 EST Ventricular Rate:  104 PR Interval:  172 QRS Duration: 83 QT Interval:  340 QTC Calculation: 447 R Axis:   -10 Text Interpretation:  Sinus tachycardia Borderline repolarization  abnormality No significant change since last tracing Confirmed by Keiasha Diep   MD, Lennette Bihari ([id number]) on 01/05/2016 10:18:30 AM      MDM   Iv ns.   Labs.  Reviewed nursing notes and prior charts for additional history.      Lactate mildly elevated.  Iv ns bolus.  ?AKI, creatinine elevated above prior, however prior was 5 yrs ago. ?due to dehydration/volume depletion, vs cri, vs infection/sepsis.   Cultures added to workup.  Iv abx.   Med service contacted for  admission.     Lajean Saver, MD 01/05/16 [phone]

## 2016-01-06 ENCOUNTER — Inpatient Hospital Stay (HOSPITAL_COMMUNITY): Payer: Medicare Other

## 2016-01-06 DIAGNOSIS — E1151 Type 2 diabetes mellitus with diabetic peripheral angiopathy without gangrene: Secondary | ICD-10-CM

## 2016-01-06 DIAGNOSIS — M7989 Other specified soft tissue disorders: Secondary | ICD-10-CM

## 2016-01-06 DIAGNOSIS — A419 Sepsis, unspecified organism: Principal | ICD-10-CM

## 2016-01-06 DIAGNOSIS — I160 Hypertensive urgency: Secondary | ICD-10-CM

## 2016-01-06 LAB — URINE CULTURE: Culture: 3000

## 2016-01-06 LAB — BASIC METABOLIC PANEL
ANION GAP: 12 (ref 5–15)
BUN: 43 mg/dL — AB (ref 6–20)
CHLORIDE: 100 mmol/L — AB (ref 101–111)
CO2: 32 mmol/L (ref 22–32)
Calcium: 9.4 mg/dL (ref 8.9–10.3)
Creatinine, Ser: 1.13 mg/dL — ABNORMAL HIGH (ref 0.44–1.00)
GFR calc Af Amer: 57 mL/min — ABNORMAL LOW (ref >60)
GFR, EST NON AFRICAN AMERICAN: 50 mL/min — AB (ref >60)
GLUCOSE: 111 mg/dL — AB (ref 65–99)
POTASSIUM: 2.6 mmol/L — AB (ref 3.5–5.1)
Sodium: 144 mmol/L (ref 135–145)

## 2016-01-06 LAB — GLUCOSE, CAPILLARY
GLUCOSE-CAPILLARY: 174 mg/dL — AB (ref 65–99)
GLUCOSE-CAPILLARY: 179 mg/dL — AB (ref 65–99)
GLUCOSE-CAPILLARY: 255 mg/dL — AB (ref 65–99)
Glucose-Capillary: 107 mg/dL — ABNORMAL HIGH (ref 65–99)

## 2016-01-06 LAB — CBC
HEMATOCRIT: 36.7 % (ref 36.0–46.0)
HEMOGLOBIN: 11.9 g/dL — AB (ref 12.0–15.0)
MCH: 29.7 pg (ref 26.0–34.0)
MCHC: 32.4 g/dL (ref 30.0–36.0)
MCV: 91.5 fL (ref 78.0–100.0)
Platelets: 324 10*3/uL (ref 150–400)
RBC: 4.01 MIL/uL (ref 3.87–5.11)
RDW: 14.2 % (ref 11.5–15.5)
WBC: 11.1 10*3/uL — ABNORMAL HIGH (ref 4.0–10.5)

## 2016-01-06 MED ORDER — POTASSIUM CHLORIDE CRYS ER 20 MEQ PO TBCR
40.0000 meq | EXTENDED_RELEASE_TABLET | Freq: Once | ORAL | Status: AC
  Administered 2016-01-06: 40 meq via ORAL
  Filled 2016-01-06: qty 2

## 2016-01-06 MED ORDER — POTASSIUM CHLORIDE 20 MEQ/15ML (10%) PO SOLN
40.0000 meq | Freq: Once | ORAL | Status: AC
  Administered 2016-01-06: 40 meq via ORAL
  Filled 2016-01-06: qty 30

## 2016-01-06 MED ORDER — METRONIDAZOLE 500 MG PO TABS
2000.0000 mg | ORAL_TABLET | Freq: Once | ORAL | Status: AC
  Administered 2016-01-06: 2000 mg via ORAL
  Filled 2016-01-06: qty 4

## 2016-01-06 NOTE — Progress Notes (Signed)
CRITICAL VALUE ALERT  Critical value received:  Potassium 2.6  Date of notification:  01/05/2015  Time of notification:  0640  Critical value read back:Yes.    Nurse who received alert:  L. Vergel de Larkin Ina RN  MD notified (1st page):  Lamar Blinks NP  Time of first page:  [phone]  MD notified (2nd page):  Time of second page:  Responding MD:  Lamar Blinks NP  Time MD responded:  [phone]

## 2016-01-06 NOTE — Progress Notes (Signed)
PROGRESS NOTE  Angela Hays [id number] DOB: 1951/10/13 DOA: 01/05/2016 PCP: Vena Austria, MD  HPI: 65 year old female with history of HTN, DM2, CVA, and HLD, who presented with chief complaint of worsening BLE edema and ulcer development.  Subjective / 24 H Interval events The patient is up in the chair, doing well, she notes mild pain at the ulcer sites but denies chest pain or dyspnea.   Assessment/Plan: Principal Problem:   Leg swelling Active Problems:   Sepsis (HCC)   Pressure ulcer   DM (diabetes mellitus), type 2 with peripheral vascular complications (HCC)   Hypertensive urgency   Bilateral Lower Extremity edema with venous insufficiency - L>R since CVA in 2008 with progressive swelling, erythema, warmth and weakness in the LEs. - Left LE with two areas Posterior 10cm x 11cm with 0.2c, anterior wound 10cm x 13cm x 0.2cm with similar presentation  - Right LE with 3 areas lateral 8cm x 2cm, ant. Wound 10cm x 4cm x 0.2cm, medial 8cm x 3cm a - very mild erythema surrounding these wounds but no significant drainage. - Continue daily cleansing with pH balanced, no rinse cleanser followed by gently patting tissue dry. Eucerin moisturizing cream after cleansing, then open areas topped with xeroform gauze per WOC recommendations. - Continue leg elevation up in chair and heel boots while in bed. - Continue PO Lasix and follow renal function.  Sepsis ([place]) due to cellulitis in LE's vs UTI - lactic acid stabilized post IVF, no leukocytosis or fevers - Blood cultures negative to date - Continue Zosyn and Vancomycin for now, consider transition to PO abx tomorrow  Trichomonas - Metronidazole 2g once  Hypokalemia - Received 176mEq total this morning, repeat labs tomorrow.  Hypertensive urgency  - Improved today, monitor - Continue Labetolol, Cardura, clonidine and scheduled + PRN hydralazine  - PO Lasix as above  DM (diabetes mellitus), type 2 with  peripheral vascular complications (HCC) - continue home medical regimen and add SSI   Acute kidney injury with Hypernatremia - Renal function improved and hypernatremia resolved after IVFs, due to significant edema no further fluids at this time - Monitor  History of CVA  - Continue Plavix and simvastatin   Diet: Regular Fluids: KVO DVT Prophylaxis: Prophylactic Lovenox  Code Status: Full Code Family Communication: None at bedside Disposition Plan: Home with Home Health PT Barriers to discharge: clinical improvement of wounds and edema, transition to PO abx, BP control   Consultants:  None  Procedures:  None   Antibiotics Vancomycin: 01/05/2016 >> Zosyn: 01/05/2016 >>   Studies  Dg Chest Port 1 View  01/05/2016  CLINICAL DATA:  Bilateral lower extremity swelling, weeping. EXAM: PORTABLE CHEST 1 VIEW COMPARISON:  12/05/2007 FINDINGS: The heart size and mediastinal contours are within normal limits. Both lungs are clear. The visualized skeletal structures are unremarkable. IMPRESSION: No active disease. Electronically Signed   By: Rolm Baptise M.D.   On: 01/05/2016 10:09    Objective  Filed Vitals:   01/05/16 2011 01/06/16 0500 01/06/16 0637 01/06/16 1255  BP: 175/66  153/53 130/62  Pulse: 111  97 74  Temp: 99.1 F (37.3 C)  99 F (37.2 C) 98.8 F (37.1 C)  TempSrc: Oral  Oral Oral  Resp: 18  18 25   Height:      Weight:  73.936 kg (163 lb) 72.576 kg (160 lb)   SpO2: 100%  96% 98%    Intake/Output Summary (Last 24 hours) at 01/06/16 1339 Last data filed at  01/06/16 1256  Gross per 24 hour  Intake 1278.25 ml  Output    625 ml  Net 653.25 ml   Filed Weights   01/05/16 1830 01/06/16 0500 01/06/16 0637  Weight: 73.483 kg (162 lb) 73.936 kg (163 lb) 72.576 kg (160 lb)   Exam:  GENERAL: NAD  HEENT: no scleral icterus  NECK: supple, 5cm soft painless mass over medial aspect of right clavicle  LUNGS: CTA biL, no crackles or wheezing  HEART: RRR without  MRG  ABDOMEN: soft, non tender, nondistended  EXTREMITIES: No clubbing / cyanosis. BLE edema L>R  NEUROLOGIC: A&Ox3  PSYCHIATRIC: normal mood and affect, cooperative  SKIN: RLE with lateral and medial wounds, LLE with anterior and posterior wounds, all sites with mild surrounding erythema and edema but no significant drainage.  Data Reviewed: Basic Metabolic Panel:  Recent Labs Lab 01/05/16 1029 01/06/16 0537  NA 147* 144  K 3.6 2.6*  CL 101 100*  CO2 29 32  GLUCOSE 112* 111*  BUN 52* 43*  CREATININE 1.43* 1.13*  CALCIUM 10.2 9.4   Liver Function Tests:  Recent Labs Lab 01/05/16 1029  AST 30  ALT 24  ALKPHOS 51  BILITOT 0.9  PROT 7.6  ALBUMIN 3.8   CBC:  Recent Labs Lab 01/05/16 1029 01/06/16 0537  WBC 10.8* 11.1*  HGB 13.5 11.9*  HCT 42.9 36.7  MCV 92.5 91.5  PLT 345 324   BNP (last 3 results)  Recent Labs  01/05/16 1031  BNP 53.7   CBG:  Recent Labs Lab 01/05/16 2016 01/06/16 0752 01/06/16 1145  GLUCAP 181* 174* 179*    Recent Results (from the past 240 hour(s))  Blood culture (routine x 2)     Status: None (Preliminary result)   Collection Time: 01/05/16 11:20 AM  Result Value Ref Range Status   Specimen Description BLOOD RIGHT HAND  Final   Special Requests BOTTLES DRAWN AEROBIC AND ANAEROBIC 5ML  Final   Culture   Final    NO GROWTH < 24 HOURS Performed at [facility]    Report Status PENDING  Incomplete  Urine culture     Status: None (Preliminary result)   Collection Time: 01/05/16  6:35 PM  Result Value Ref Range Status   Specimen Description URINE, CLEAN CATCH  Final   Special Requests NONE  Final   Culture   Final    TOO YOUNG TO READ Performed at [facility]    Report Status PENDING  Incomplete  Blood culture (routine x 2)     Status: None (Preliminary result)   Collection Time: 01/05/16  7:08 PM  Result Value Ref Range Status   Specimen Description BLOOD RIGHT ARM  Final   Special Requests  BOTTLES DRAWN AEROBIC AND ANAEROBIC 10 CC  Final   Culture   Final    NO GROWTH < 24 HOURS Performed at [facility]    Report Status PENDING  Incomplete     Scheduled Meds: . cloNIDine  0.2 mg Oral TID  . clopidogrel  75 mg Oral Daily  . doxazosin  8 mg Oral QHS  . enoxaparin (LOVENOX) injection  40 mg Subcutaneous Q24H  . furosemide  80 mg Oral Q breakfast  . hydrALAZINE  10 mg Oral 3 times per day  . hydrocerin   Topical Daily  . hydrochlorothiazide  25 mg Oral Q breakfast  . insulin aspart  0-9 Units Subcutaneous TID WC  . insulin glargine  15 Units Subcutaneous QPM  .  labetalol  200 mg Oral BID  . piperacillin-tazobactam (ZOSYN)  IV  3.375 g Intravenous Q8H  . potassium chloride SA  20 mEq Oral Q breakfast  . simvastatin  20 mg Oral Q breakfast  . sodium chloride  3 mL Intravenous Q12H  . vancomycin  750 mg Intravenous Q12H   Continuous Infusions: . sodium chloride 10 mL/hr at 01/06/16 0200   Marzetta Board, MD [facility] Pager [phone]. If 7 PM - 7 AM, please contact night-coverage at [email or web address], password [facility] 01/06/2016, 1:39 PM  LOS: 1 day

## 2016-01-06 NOTE — Evaluation (Signed)
Physical Therapy Evaluation Patient Details Name: Angela Hays MRN: [id number] DOB: Feb 17, 1951 Today's Date: 01/06/2016   History of Present Illness  65 yo female admitted with bil leg swelling, wounds, cellulitis. hx of CVA, DM, HTN, venous insufficiency.   Clinical Impression  On eval, pt required Min assist for mobility. Pt performed squat pivot from bed to recliner. Some difficulty due to weakness and bil LE pain. Per pt report, at baseline, she is modified independent with ADLs and transfers. Discussed d/c plan-pt states she will return home with son providing care. Pt is agreeable to HHPT.     Follow Up Recommendations Home health PT;Supervision/Assistance - 24 hour    Equipment Recommendations  None recommended by PT    Recommendations for Other Services       Precautions / Restrictions Precautions Precautions: Fall Restrictions Weight Bearing Restrictions: No      Mobility  Bed Mobility Overal bed mobility: Needs Assistance Bed Mobility: Supine to Sit     Supine to sit: Min assist     General bed mobility comments: Assist for LEs. Increased time.   Transfers Overall transfer level: Needs assistance   Transfers: Squat Pivot Transfers     Squat pivot transfers: Min assist     General transfer comment: Modified squat pivot-pt like chair to positoned in front of her. She uses both armrest to pull up on and shifts bottom into chair. Pt was unable to stand on today.  Ambulation/Gait                Stairs            Wheelchair Mobility    Modified Rankin (Stroke Patients Only)       Balance Overall balance assessment: Needs assistance Sitting-balance support: Bilateral upper extremity supported;Feet supported Sitting balance-Leahy Scale: Good                                       Pertinent Vitals/Pain Pain Assessment: 0-10 Pain Score: 10-Worst pain ever Pain Location: bil LEs Pain Descriptors / Indicators: Sore     Home Living Family/patient expects to be discharged to:: Private residence Living Arrangements: Children   Type of Home: House Home Access: Level entry     Home Layout: One level Home Equipment: Environmental consultant - 2 wheels;Cane - single point;Bedside commode;Wheelchair - manual      Prior Function     Gait / Transfers Assistance Needed: non ambulatory per pt. pt states she transfers into wheelchair from bed. uses sink to pull up on in bathroom. able to stand.           Hand Dominance        Extremity/Trunk Assessment   Upper Extremity Assessment: Generalized weakness           Lower Extremity Assessment: RLE deficits/detail;LLE deficits/detail RLE Deficits / Details: Pt unable to lift LE off bed.  LLE Deficits / Details: spasticity noted. Pt unable to lift LE off bed.   Cervical / Trunk Assessment: Kyphotic  Communication   Communication: No difficulties  Cognition Arousal/Alertness: Awake/alert Behavior During Therapy: WFL for tasks assessed/performed Overall Cognitive Status: Within Functional Limits for tasks assessed                      General Comments      Exercises        Assessment/Plan    PT Assessment Patient  needs continued PT services  PT Diagnosis Generalized weakness;Acute pain   PT Problem List Decreased strength;Decreased activity tolerance;Pain;Decreased mobility;Decreased skin integrity  PT Treatment Interventions DME instruction;Functional mobility training;Therapeutic activities;Patient/family education;Therapeutic exercise   PT Goals (Current goals can be found in the Care Plan section) Acute Rehab PT Goals Patient Stated Goal: home soon PT Goal Formulation: With patient Time For Goal Achievement: 01/20/16 Potential to Achieve Goals: Fair    Frequency Min 3X/week   Barriers to discharge        Co-evaluation               End of Session Equipment Utilized During Treatment: Gait belt Activity Tolerance: Patient  limited by pain Patient left: in chair;with call bell/phone within reach;with chair alarm set           Time: [id number] PT Time Calculation (min) (ACUTE ONLY): 16 min   Charges:   PT Evaluation $PT Eval Moderate Complexity: 1 Procedure     PT G Codes:        Weston Anna, MPT Pager: [phone]

## 2016-01-07 ENCOUNTER — Inpatient Hospital Stay (HOSPITAL_COMMUNITY): Payer: Medicare Other

## 2016-01-07 LAB — BASIC METABOLIC PANEL
Anion gap: 10 (ref 5–15)
BUN: 32 mg/dL — AB (ref 6–20)
CO2: 30 mmol/L (ref 22–32)
CREATININE: 1.21 mg/dL — AB (ref 0.44–1.00)
Calcium: 9 mg/dL (ref 8.9–10.3)
Chloride: 105 mmol/L (ref 101–111)
GFR calc Af Amer: 53 mL/min — ABNORMAL LOW (ref >60)
GFR, EST NON AFRICAN AMERICAN: 46 mL/min — AB (ref >60)
GLUCOSE: 119 mg/dL — AB (ref 65–99)
POTASSIUM: 3.3 mmol/L — AB (ref 3.5–5.1)
SODIUM: 145 mmol/L (ref 135–145)

## 2016-01-07 LAB — CBC
HEMATOCRIT: 36 % (ref 36.0–46.0)
Hemoglobin: 11.5 g/dL — ABNORMAL LOW (ref 12.0–15.0)
MCH: 29.5 pg (ref 26.0–34.0)
MCHC: 31.9 g/dL (ref 30.0–36.0)
MCV: 92.3 fL (ref 78.0–100.0)
PLATELETS: 283 10*3/uL (ref 150–400)
RBC: 3.9 MIL/uL (ref 3.87–5.11)
RDW: 14.6 % (ref 11.5–15.5)
WBC: 9.5 10*3/uL (ref 4.0–10.5)

## 2016-01-07 LAB — HEMOGLOBIN A1C
Hgb A1c MFr Bld: 6.8 % — ABNORMAL HIGH (ref 4.8–5.6)
Mean Plasma Glucose: 148 mg/dL

## 2016-01-07 LAB — GLUCOSE, CAPILLARY: Glucose-Capillary: 96 mg/dL (ref 65–99)

## 2016-01-07 MED ORDER — DOXYCYCLINE HYCLATE 100 MG PO TABS: 100.0000 mg | ORAL_TABLET | Freq: Two times a day (BID) | ORAL | Status: DC

## 2016-01-07 MED ORDER — POTASSIUM CHLORIDE CRYS ER 20 MEQ PO TBCR
40.0000 meq | EXTENDED_RELEASE_TABLET | Freq: Once | ORAL | Status: AC
  Administered 2016-01-07: 40 meq via ORAL
  Filled 2016-01-07: qty 2

## 2016-01-07 NOTE — Progress Notes (Signed)
Pt had no preference for [facility] agency.  Referral given to in house rep with [facility].

## 2016-01-07 NOTE — Discharge Instructions (Signed)
Follow with Vena Austria, MD in 5-7 days  Please get a complete blood count and chemistry panel checked by your Primary MD at your next visit, and again as instructed by your Primary MD. Please get your medications reviewed and adjusted by your Primary MD.  Please request your Primary MD to go over all Hospital Tests and Procedure/Radiological results at the follow up, please get all Hospital records sent to your Prim MD by signing hospital release before you go home.  If you had Pneumonia of Lung problems at the Hospital: Please get a 2 view Chest X ray done in 6-8 weeks after hospital discharge or sooner if instructed by your Primary MD.  If you have Congestive Heart Failure: Please call your Cardiologist or Primary MD anytime you have any of the following symptoms:  1) 3 pound weight gain in 24 hours or 5 pounds in 1 week  2) shortness of breath, with or without a dry hacking cough  3) swelling in the hands, feet or stomach  4) if you have to sleep on extra pillows at night in order to breathe  Follow cardiac low salt diet and 1.5 lit/day fluid restriction.  If you have diabetes Accuchecks 4 times/day, Once in AM empty stomach and then before each meal. Log in all results and show them to your primary doctor at your next visit. If any glucose reading is under 80 or above 300 call your primary MD immediately.  If you have Seizure/Convulsions/Epilepsy: Please do not drive, operate heavy machinery, participate in activities at heights or participate in high speed sports until you have seen by Primary MD or a Neurologist and advised to do so again.  If you had Gastrointestinal Bleeding: Please ask your Primary MD to check a complete blood count within one week of discharge or at your next visit. Your endoscopic/colonoscopic biopsies that are pending at the time of discharge, will also need to followed by your Primary MD.  Get Medicines reviewed and adjusted. Please take all  your medications with you for your next visit with your Primary MD  Please request your Primary MD to go over all hospital tests and procedure/radiological results at the follow up, please ask your Primary MD to get all Hospital records sent to his/her office.  If you experience worsening of your admission symptoms, develop shortness of breath, life threatening emergency, suicidal or homicidal thoughts you must seek medical attention immediately by calling 911 or calling your MD immediately  if symptoms less severe.  You must read complete instructions/literature along with all the possible adverse reactions/side effects for all the Medicines you take and that have been prescribed to you. Take any new Medicines after you have completely understood and accpet all the possible adverse reactions/side effects.   Do not drive or operate heavy machinery when taking Pain medications.   Do not take more than prescribed Pain, Sleep and Anxiety Medications  Special Instructions: If you have smoked or chewed Tobacco  in the last 2 yrs please stop smoking, stop any regular Alcohol  and or any Recreational drug use.  Wear Seat belts while driving.  Please note You were cared for by a hospitalist during your hospital stay. If you have any questions about your discharge medications or the care you received while you were in the hospital after you are discharged, you can call the unit and asked to speak with the hospitalist on call if the hospitalist that took care of you is not available. Once  you are discharged, your primary care physician will handle any further medical issues. Please note that NO REFILLS for any discharge medications will be authorized once you are discharged, as it is imperative that you return to your primary care physician (or establish a relationship with a primary care physician if you do not have one) for your aftercare needs so that they can reassess your need for medications and monitor  your lab values.  You can reach the hospitalist office at phone [phone] or fax [phone]   If you do not have a primary care physician, you can call [phone] for a physician referral.  Activity: As tolerated with Full fall precautions use walker/cane & assistance as needed  Diet: regular  Disposition Home

## 2016-01-07 NOTE — Discharge Summary (Signed)
Physician Discharge Summary  Angela Hays [id number] DOB: [birth year] DOA: 01/05/2016  PCP: Vena Austria, MD  Admit date: 01/05/2016 Discharge date: 01/07/2016  Time spent: > 30 minutes  Recommendations for Outpatient Follow-up:  1. Follow up with PCP in 2-3 weeks 2. Continue Doxycycline for 7 days  3. Home health PT/RN for wound care   Discharge Diagnoses:  Principal Problem:   Leg swelling Active Problems:   Sepsis ([place])   Pressure ulcer   DM (diabetes mellitus), type 2 with peripheral vascular complications [facility])   Hypertensive urgency  Discharge Condition: stable  Diet recommendation: heart healthy  Filed Weights   01/06/16 0500 01/06/16 0637 01/07/16 0517  Weight: 73.936 kg (163 lb) 72.576 kg (160 lb) 71.079 kg (156 lb 11.2 oz)    History of present illness:  See H&P, Labs, Consult and Test reports for all details in brief, patient is a 65 year old female with history of HTN, DM2, CVA, and HLD, who presented with chief complaint of worsening BLE edema and ulcer development.  Hospital Course:  Bilateral Lower Extremity edema with venous insufficiency - Left LE with two areas Posterior 10cm x 11cm with 0.2c, anterior wound 10cm x 13cm x 0.2cm with similar presentation, Right LE with 3 areas lateral 8cm x 2cm, ant. Wound 10cm x 4cm x 0.2cm, medial 8cm x 3cm. Patient with very mild erythema surrounding these wounds but no significant drainage, improved on antibiotics. She was maintained on broad spectrum antibiotics for 2 days, cultures have remained negative and her cellulitis improved. Her antibiotics were narrowed to Doxycycline and she is to complete 7 additional days. Continue daily cleansing with pH balanced, no rinse cleanser followed by gently patting tissue dry. Eucerin moisturizing cream after cleansing, then open areas topped with xeroform gauze per WOC recommendations.HHRN ordered on discharge Sepsis ([place]) due to cellulitis in LE's vs UTI - lactic  acid stabilized post IVF, no leukocytosis or fevers, Blood cultures negative to date Trichomonas - Metronidazole 2g once Hypokalemia - improved, resume home K Hypertensive urgency - Improved DM (diabetes mellitus), type 2 with peripheral vascular complications ([facility]) - continue home medical regimen Acute kidney injury with Hypernatremia - improved History of CVA - Continue Plavix and simvastatin  Procedures:  None    Consultations:  None   Discharge Exam: Filed Vitals:   01/06/16 0637 01/06/16 1255 01/06/16 2059 01/07/16 0517  BP: 153/53 130/62 117/67 131/57  Pulse: 97 74 83 81  Temp: 99 F (37.2 C) 98.8 F (37.1 C) 99.3 F (37.4 C) 98.3 F (36.8 C)  TempSrc: Oral Oral Oral Oral  Resp: 18 25 18 20   Height:      Weight: 72.576 kg (160 lb)   71.079 kg (156 lb 11.2 oz)  SpO2: 96% 98% 98% 94%    General: NAD Cardiovascular: RRR Respiratory: CTA biL  Discharge Instructions Activity:  As tolerated   Get Medicines reviewed and adjusted: Please take all your medications with you for your next visit with your Primary MD  Please request your Primary MD to go over all hospital tests and procedure/radiological results at the follow up, please ask your Primary MD to get all Hospital records sent to his/her office.  If you experience worsening of your admission symptoms, develop shortness of breath, life threatening emergency, suicidal or homicidal thoughts you must seek medical attention immediately by calling 911 or calling your MD immediately if symptoms less severe.  You must read complete instructions/literature along with all the possible adverse reactions/side effects  for all the Medicines you take and that have been prescribed to you. Take any new Medicines after you have completely understood and accpet all the possible adverse reactions/side effects.   Do not drive when taking Pain medications.   Do not take more than prescribed Pain, Sleep and Anxiety  Medications  Special Instructions: If you have smoked or chewed Tobacco in the last 2 yrs please stop smoking, stop any regular Alcohol and or any Recreational drug use.  Wear Seat belts while driving.  Please note  You were cared for by a hospitalist during your hospital stay. Once you are discharged, your primary care physician will handle any further medical issues. Please note that NO REFILLS for any discharge medications will be authorized once you are discharged, as it is imperative that you return to your primary care physician (or establish a relationship with a primary care physician if you do not have one) for your aftercare needs so that they can reassess your need for medications and monitor your lab values.    Medication List    TAKE these medications        amLODipine 10 MG tablet  Commonly known as:  NORVASC  Take 5 mg by mouth daily with breakfast.     cloNIDine 0.2 MG tablet  Commonly known as:  CATAPRES  Take 0.2 mg by mouth 3 (three) times daily.     clopidogrel 75 MG tablet  Commonly known as:  PLAVIX  Take 75 mg by mouth daily.     doxazosin 8 MG tablet  Commonly known as:  CARDURA  Take 8 mg by mouth at bedtime.     doxycycline 100 MG tablet  Commonly known as:  VIBRA-TABS  Take 1 tablet (100 mg total) by mouth 2 (two) times daily.     furosemide 40 MG tablet  Commonly known as:  LASIX  Take 80 mg by mouth daily with breakfast.     hydrochlorothiazide 25 MG tablet  Commonly known as:  HYDRODIURIL  Take 25 mg by mouth daily with breakfast.     insulin glargine 100 UNIT/ML injection  Commonly known as:  LANTUS  Inject 15 Units into the skin every evening.     labetalol 200 MG tablet  Commonly known as:  NORMODYNE  Take 200 mg by mouth 2 (two) times daily.     metFORMIN 850 MG tablet  Commonly known as:  GLUCOPHAGE  Take 850 mg by mouth 2 (two) times daily with a meal.     NUTRA-SUPPORT BONE Caps  Take 1 tablet by mouth daily.      potassium chloride SA 20 MEQ tablet  Commonly known as:  K-DUR,KLOR-CON  Take 20 mEq by mouth daily with breakfast.     simvastatin 20 MG tablet  Commonly known as:  ZOCOR  Take 20 mg by mouth daily with breakfast.     valsartan 320 MG tablet  Commonly known as:  DIOVAN  Take 320 mg by mouth daily with breakfast.           Follow-up Information    Follow up with Vena Austria, MD. Schedule an appointment as soon as possible for a visit in 1 week.   Specialty:  Family Medicine   Contact information:   [address] [place] [address] [phone]       The results of significant diagnostics from this hospitalization (including imaging, microbiology, ancillary and laboratory) are listed below for reference.    Significant  Diagnostic Studies: Dg Chest Port 1 View  01/05/2016  CLINICAL DATA:  Bilateral lower extremity swelling, weeping. EXAM: PORTABLE CHEST 1 VIEW COMPARISON:  12/05/2007 FINDINGS: The heart size and mediastinal contours are within normal limits. Both lungs are clear. The visualized skeletal structures are unremarkable. IMPRESSION: No active disease. Electronically Signed   By: Rolm Baptise M.D.   On: 01/05/2016 10:09    Microbiology: Recent Results (from the past 240 hour(s))  Blood culture (routine x 2)     Status: None (Preliminary result)   Collection Time: 01/05/16 11:20 AM  Result Value Ref Range Status   Specimen Description BLOOD RIGHT HAND  Final   Special Requests BOTTLES DRAWN AEROBIC AND ANAEROBIC 5ML  Final   Culture   Final    NO GROWTH 2 DAYS Performed at [facility]    Report Status PENDING  Incomplete  Urine culture     Status: None   Collection Time: 01/05/16  6:35 PM  Result Value Ref Range Status   Specimen Description URINE, CLEAN CATCH  Final   Special Requests NONE  Final   Culture   Final    3,000 COLONIES/mL INSIGNIFICANT GROWTH Performed at [facility]    Report Status 01/06/2016  FINAL  Final  Blood culture (routine x 2)     Status: None (Preliminary result)   Collection Time: 01/05/16  7:08 PM  Result Value Ref Range Status   Specimen Description BLOOD RIGHT ARM  Final   Special Requests BOTTLES DRAWN AEROBIC AND ANAEROBIC 10 CC  Final   Culture   Final    NO GROWTH 2 DAYS Performed at [facility]    Report Status PENDING  Incomplete     Labs: Basic Metabolic Panel:  Recent Labs Lab 01/05/16 1029 01/06/16 0537 01/07/16 0453  NA 147* 144 145  K 3.6 2.6* 3.3*  CL 101 100* 105  CO2 29 32 30  GLUCOSE 112* 111* 119*  BUN 52* 43* 32*  CREATININE 1.43* 1.13* 1.21*  CALCIUM 10.2 9.4 9.0   Liver Function Tests:  Recent Labs Lab 01/05/16 1029  AST 30  ALT 24  ALKPHOS 51  BILITOT 0.9  PROT 7.6  ALBUMIN 3.8   CBC:  Recent Labs Lab 01/05/16 1029 01/06/16 0537 01/07/16 0453  WBC 10.8* 11.1* 9.5  HGB 13.5 11.9* 11.5*  HCT 42.9 36.7 36.0  MCV 92.5 91.5 92.3  PLT 345 324 283   BNP: BNP (last 3 results)  Recent Labs  01/05/16 1031  BNP 53.7    CBG:  Recent Labs Lab 01/06/16 0752 01/06/16 1145 01/06/16 1621 01/06/16 2058 01/07/16 0731  GLUCAP 174* 179* 255* 107* 96       Signed:  Mendel Binsfeld  [facility] 01/07/2016, 2:22 PM

## 2016-01-07 NOTE — Clinical Documentation Improvement (Signed)
Internal Medicine  A cause and effect relationship may not be assumed and must be documented by a provider.  Please clarify the relationship, if any, between patient's Diabetes and Venous insufficiency ulcers.  Are the conditions:   Due to or associated with each other  Unrelated to each other  Other  Clinically Undetermined  Please exercise your independent, professional judgment when responding. A specific answer is not anticipated or expected.  Thank You,  Ezekiel Ina RN [place] [phone]

## 2016-01-10 LAB — CULTURE, BLOOD (ROUTINE X 2)
Culture: NO GROWTH
Culture: NO GROWTH

## 2016-05-06 ENCOUNTER — Inpatient Hospital Stay (HOSPITAL_COMMUNITY): Payer: Medicare Other

## 2016-05-06 ENCOUNTER — Encounter (HOSPITAL_COMMUNITY): Payer: Self-pay | Admitting: *Deleted

## 2016-05-06 ENCOUNTER — Emergency Department (HOSPITAL_COMMUNITY): Payer: Medicare Other

## 2016-05-06 ENCOUNTER — Inpatient Hospital Stay (HOSPITAL_COMMUNITY)
Admission: EM | Admit: 2016-05-06 | Discharge: 2016-05-15 | DRG: 871 | Disposition: A | Discharge status: Home Health | Payer: Medicare Other | Attending: Internal Medicine | Admitting: Internal Medicine

## 2016-05-06 DIAGNOSIS — B9689 Other specified bacterial agents as the cause of diseases classified elsewhere: Secondary | ICD-10-CM | POA: Diagnosis not present

## 2016-05-06 DIAGNOSIS — G9340 Encephalopathy, unspecified: Secondary | ICD-10-CM | POA: Diagnosis not present

## 2016-05-06 DIAGNOSIS — Z794 Long term (current) use of insulin: Secondary | ICD-10-CM

## 2016-05-06 DIAGNOSIS — E870 Hyperosmolality and hypernatremia: Secondary | ICD-10-CM | POA: Diagnosis present

## 2016-05-06 DIAGNOSIS — I10 Essential (primary) hypertension: Secondary | ICD-10-CM | POA: Diagnosis present

## 2016-05-06 DIAGNOSIS — Z95828 Presence of other vascular implants and grafts: Secondary | ICD-10-CM

## 2016-05-06 DIAGNOSIS — A419 Sepsis, unspecified organism: Secondary | ICD-10-CM

## 2016-05-06 DIAGNOSIS — L97919 Non-pressure chronic ulcer of unspecified part of right lower leg with unspecified severity: Secondary | ICD-10-CM | POA: Diagnosis present

## 2016-05-06 DIAGNOSIS — E1151 Type 2 diabetes mellitus with diabetic peripheral angiopathy without gangrene: Secondary | ICD-10-CM

## 2016-05-06 DIAGNOSIS — N179 Acute kidney failure, unspecified: Secondary | ICD-10-CM

## 2016-05-06 DIAGNOSIS — L97909 Non-pressure chronic ulcer of unspecified part of unspecified lower leg with unspecified severity: Secondary | ICD-10-CM | POA: Diagnosis present

## 2016-05-06 DIAGNOSIS — Z7902 Long term (current) use of antithrombotics/antiplatelets: Secondary | ICD-10-CM | POA: Diagnosis not present

## 2016-05-06 DIAGNOSIS — L03119 Cellulitis of unspecified part of limb: Secondary | ICD-10-CM

## 2016-05-06 DIAGNOSIS — D649 Anemia, unspecified: Secondary | ICD-10-CM | POA: Diagnosis not present

## 2016-05-06 DIAGNOSIS — E872 Acidosis: Secondary | ICD-10-CM | POA: Diagnosis present

## 2016-05-06 DIAGNOSIS — R410 Disorientation, unspecified: Secondary | ICD-10-CM | POA: Diagnosis not present

## 2016-05-06 DIAGNOSIS — A4150 Gram-negative sepsis, unspecified: Secondary | ICD-10-CM | POA: Diagnosis present

## 2016-05-06 DIAGNOSIS — R197 Diarrhea, unspecified: Secondary | ICD-10-CM | POA: Diagnosis not present

## 2016-05-06 DIAGNOSIS — R7881 Bacteremia: Secondary | ICD-10-CM | POA: Diagnosis not present

## 2016-05-06 DIAGNOSIS — R680 Hypothermia, not associated with low environmental temperature: Secondary | ICD-10-CM | POA: Diagnosis not present

## 2016-05-06 DIAGNOSIS — L97929 Non-pressure chronic ulcer of unspecified part of left lower leg with unspecified severity: Secondary | ICD-10-CM | POA: Diagnosis present

## 2016-05-06 DIAGNOSIS — E785 Hyperlipidemia, unspecified: Secondary | ICD-10-CM | POA: Diagnosis present

## 2016-05-06 DIAGNOSIS — Z7984 Long term (current) use of oral hypoglycemic drugs: Secondary | ICD-10-CM

## 2016-05-06 DIAGNOSIS — Z23 Encounter for immunization: Secondary | ICD-10-CM | POA: Diagnosis not present

## 2016-05-06 DIAGNOSIS — R6521 Severe sepsis with septic shock: Secondary | ICD-10-CM | POA: Diagnosis present

## 2016-05-06 DIAGNOSIS — E860 Dehydration: Secondary | ICD-10-CM | POA: Diagnosis present

## 2016-05-06 DIAGNOSIS — L02419 Cutaneous abscess of limb, unspecified: Secondary | ICD-10-CM | POA: Diagnosis present

## 2016-05-06 DIAGNOSIS — L8990 Pressure ulcer of unspecified site, unspecified stage: Secondary | ICD-10-CM

## 2016-05-06 DIAGNOSIS — E875 Hyperkalemia: Secondary | ICD-10-CM

## 2016-05-06 DIAGNOSIS — R5381 Other malaise: Secondary | ICD-10-CM | POA: Diagnosis present

## 2016-05-06 DIAGNOSIS — L03115 Cellulitis of right lower limb: Secondary | ICD-10-CM | POA: Diagnosis present

## 2016-05-06 DIAGNOSIS — E11622 Type 2 diabetes mellitus with other skin ulcer: Secondary | ICD-10-CM | POA: Diagnosis present

## 2016-05-06 DIAGNOSIS — Z8673 Personal history of transient ischemic attack (TIA), and cerebral infarction without residual deficits: Secondary | ICD-10-CM

## 2016-05-06 DIAGNOSIS — M7989 Other specified soft tissue disorders: Secondary | ICD-10-CM

## 2016-05-06 DIAGNOSIS — D72829 Elevated white blood cell count, unspecified: Secondary | ICD-10-CM

## 2016-05-06 DIAGNOSIS — E876 Hypokalemia: Secondary | ICD-10-CM | POA: Diagnosis not present

## 2016-05-06 DIAGNOSIS — L0390 Cellulitis, unspecified: Secondary | ICD-10-CM

## 2016-05-06 DIAGNOSIS — L03116 Cellulitis of left lower limb: Secondary | ICD-10-CM | POA: Diagnosis present

## 2016-05-06 DIAGNOSIS — I609 Nontraumatic subarachnoid hemorrhage, unspecified: Secondary | ICD-10-CM

## 2016-05-06 DIAGNOSIS — Z79899 Other long term (current) drug therapy: Secondary | ICD-10-CM | POA: Diagnosis not present

## 2016-05-06 DIAGNOSIS — J9600 Acute respiratory failure, unspecified whether with hypoxia or hypercapnia: Secondary | ICD-10-CM

## 2016-05-06 LAB — URINALYSIS, ROUTINE W REFLEX MICROSCOPIC
GLUCOSE, UA: NEGATIVE mg/dL
Hgb urine dipstick: NEGATIVE
KETONES UR: 15 mg/dL — AB
LEUKOCYTES UA: NEGATIVE
NITRITE: NEGATIVE
PROTEIN: NEGATIVE mg/dL
Specific Gravity, Urine: 1.027 (ref 1.005–1.030)
pH: 5 (ref 5.0–8.0)

## 2016-05-06 LAB — CBC WITH DIFFERENTIAL/PLATELET
BASOS ABS: 0 10*3/uL (ref 0.0–0.1)
BASOS PCT: 0 %
Basophils Absolute: 0 10*3/uL (ref 0.0–0.1)
Basophils Relative: 0 %
EOS ABS: 0 10*3/uL (ref 0.0–0.7)
EOS PCT: 0 %
Eosinophils Absolute: 0 10*3/uL (ref 0.0–0.7)
Eosinophils Relative: 0 %
HCT: 33.9 % — ABNORMAL LOW (ref 36.0–46.0)
HEMATOCRIT: 33.6 % — AB (ref 36.0–46.0)
HEMOGLOBIN: 10.8 g/dL — AB (ref 12.0–15.0)
Hemoglobin: 10.8 g/dL — ABNORMAL LOW (ref 12.0–15.0)
LYMPHS ABS: 0.6 10*3/uL — AB (ref 0.7–4.0)
LYMPHS PCT: 6 %
Lymphocytes Relative: 4 %
Lymphs Abs: 0.7 10*3/uL (ref 0.7–4.0)
MCH: 27.1 pg (ref 26.0–34.0)
MCH: 27.3 pg (ref 26.0–34.0)
MCHC: 31.9 g/dL (ref 30.0–36.0)
MCHC: 32.1 g/dL (ref 30.0–36.0)
MCV: 85 fL (ref 78.0–100.0)
MCV: 85.1 fL (ref 78.0–100.0)
MONOS PCT: 4 %
Monocytes Absolute: 0.5 10*3/uL (ref 0.1–1.0)
Monocytes Absolute: 0.5 10*3/uL (ref 0.1–1.0)
Monocytes Relative: 4 %
NEUTROS ABS: 10.8 10*3/uL — AB (ref 1.7–7.7)
NEUTROS ABS: 12.7 10*3/uL — AB (ref 1.7–7.7)
NEUTROS PCT: 92 %
Neutrophils Relative %: 90 %
PLATELETS: 283 10*3/uL (ref 150–400)
Platelets: 296 10*3/uL (ref 150–400)
RBC: 3.99 MIL/uL (ref 3.87–5.11)
RBC: 3.95 MIL/uL (ref 3.87–5.11)
RDW: 16.2 % — AB (ref 11.5–15.5)
RDW: 16.1 % — ABNORMAL HIGH (ref 11.5–15.5)
WBC: 11.9 10*3/uL — AB (ref 4.0–10.5)
WBC: 13.8 10*3/uL — AB (ref 4.0–10.5)

## 2016-05-06 LAB — COMPREHENSIVE METABOLIC PANEL
ALBUMIN: 2.2 g/dL — AB (ref 3.5–5.0)
ALK PHOS: 49 U/L (ref 38–126)
ALK PHOS: 49 U/L (ref 38–126)
ALT: 35 U/L (ref 14–54)
ALT: 36 U/L (ref 14–54)
ANION GAP: 20 — AB (ref 5–15)
AST: 36 U/L (ref 15–41)
AST: 41 U/L (ref 15–41)
Albumin: 2.5 g/dL — ABNORMAL LOW (ref 3.5–5.0)
Anion gap: 17 — ABNORMAL HIGH (ref 5–15)
BILIRUBIN TOTAL: 0.6 mg/dL (ref 0.3–1.2)
BILIRUBIN TOTAL: 0.9 mg/dL (ref 0.3–1.2)
BUN: 185 mg/dL — AB (ref 6–20)
BUN: 165 mg/dL — AB (ref 6–20)
CALCIUM: 8.1 mg/dL — AB (ref 8.9–10.3)
CALCIUM: 7.4 mg/dL — AB (ref 8.9–10.3)
CO2: 7 mmol/L — ABNORMAL LOW (ref 22–32)
CO2: 7 mmol/L — AB (ref 22–32)
CREATININE: 3.28 mg/dL — AB (ref 0.44–1.00)
Chloride: 113 mmol/L — ABNORMAL HIGH (ref 101–111)
Chloride: 116 mmol/L — ABNORMAL HIGH (ref 101–111)
Creatinine, Ser: 3.83 mg/dL — ABNORMAL HIGH (ref 0.44–1.00)
GFR calc Af Amer: 13 mL/min — ABNORMAL LOW (ref >60)
GFR calc Af Amer: 16 mL/min — ABNORMAL LOW (ref >60)
GFR calc non Af Amer: 14 mL/min — ABNORMAL LOW (ref >60)
GFR, EST NON AFRICAN AMERICAN: 11 mL/min — AB (ref >60)
Glucose, Bld: 55 mg/dL — ABNORMAL LOW (ref 65–99)
Glucose, Bld: 178 mg/dL — ABNORMAL HIGH (ref 65–99)
POTASSIUM: 6 mmol/L — AB (ref 3.5–5.1)
Potassium: 5.5 mmol/L — ABNORMAL HIGH (ref 3.5–5.1)
SODIUM: 140 mmol/L (ref 135–145)
Sodium: 140 mmol/L (ref 135–145)
TOTAL PROTEIN: 5.4 g/dL — AB (ref 6.5–8.1)
TOTAL PROTEIN: 4.9 g/dL — AB (ref 6.5–8.1)

## 2016-05-06 LAB — APTT: aPTT: 39 seconds — ABNORMAL HIGH (ref 24–37)

## 2016-05-06 LAB — LACTIC ACID, PLASMA: Lactic Acid, Venous: 0.8 mmol/L (ref 0.5–2.0)

## 2016-05-06 LAB — PROTIME-INR
INR: 1.42 (ref 0.00–1.49)
PROTHROMBIN TIME: 17.4 s — AB (ref 11.6–15.2)

## 2016-05-06 LAB — CK: CK TOTAL: 998 U/L — AB (ref 38–234)

## 2016-05-06 LAB — CBG MONITORING, ED
GLUCOSE-CAPILLARY: 135 mg/dL — AB (ref 65–99)
Glucose-Capillary: 37 mg/dL — CL (ref 65–99)
Glucose-Capillary: 135 mg/dL — ABNORMAL HIGH (ref 65–99)
Glucose-Capillary: 161 mg/dL — ABNORMAL HIGH (ref 65–99)

## 2016-05-06 LAB — TROPONIN I: TROPONIN I: 0.03 ng/mL (ref <0.031)

## 2016-05-06 LAB — I-STAT CG4 LACTIC ACID, ED
LACTIC ACID, VENOUS: 0.59 mmol/L (ref 0.5–2.0)
Lactic Acid, Venous: 0.77 mmol/L (ref 0.5–2.0)

## 2016-05-06 LAB — PHOSPHORUS: Phosphorus: 8.4 mg/dL — ABNORMAL HIGH (ref 2.5–4.6)

## 2016-05-06 LAB — MAGNESIUM: Magnesium: 2 mg/dL (ref 1.7–2.4)

## 2016-05-06 MED ORDER — HEPARIN SODIUM (PORCINE) 5000 UNIT/ML IJ SOLN
5000.0000 [IU] | Freq: Three times a day (TID) | INTRAMUSCULAR | Status: DC
  Administered 2016-05-06 – 2016-05-07 (×2): 5000 [IU] via SUBCUTANEOUS
  Filled 2016-05-06 (×3): qty 1

## 2016-05-06 MED ORDER — SODIUM CHLORIDE 0.9 % IV SOLN
1.0000 g | Freq: Once | INTRAVENOUS | Status: AC
  Administered 2016-05-06: 1 g via INTRAVENOUS
  Filled 2016-05-06: qty 10

## 2016-05-06 MED ORDER — PIPERACILLIN-TAZOBACTAM IN DEX 2-0.25 GM/50ML IV SOLN
2.2500 g | Freq: Three times a day (TID) | INTRAVENOUS | Status: DC
  Administered 2016-05-07 – 2016-05-08 (×4): 2.25 g via INTRAVENOUS
  Filled 2016-05-06 (×7): qty 50

## 2016-05-06 MED ORDER — SODIUM CHLORIDE 0.9 % IV BOLUS (SEPSIS)
1000.0000 mL | Freq: Once | INTRAVENOUS | Status: AC
  Administered 2016-05-06: 1000 mL via INTRAVENOUS

## 2016-05-06 MED ORDER — SODIUM CHLORIDE 0.9 % IV SOLN
250.0000 mL | INTRAVENOUS | Status: DC | PRN
  Administered 2016-05-08: 250 mL via INTRAVENOUS

## 2016-05-06 MED ORDER — NOREPINEPHRINE BITARTRATE 1 MG/ML IV SOLN
0.0000 ug/min | Freq: Once | INTRAVENOUS | Status: AC
  Administered 2016-05-06: 10 ug/min via INTRAVENOUS
  Filled 2016-05-06: qty 4

## 2016-05-06 MED ORDER — VANCOMYCIN HCL 10 G IV SOLR
1500.0000 mg | Freq: Once | INTRAVENOUS | Status: AC
  Administered 2016-05-06: 1500 mg via INTRAVENOUS
  Filled 2016-05-06: qty 1500

## 2016-05-06 MED ORDER — VANCOMYCIN HCL IN DEXTROSE 1-5 GM/200ML-% IV SOLN
1000.0000 mg | INTRAVENOUS | Status: DC
  Filled 2016-05-06: qty 200

## 2016-05-06 MED ORDER — PIPERACILLIN-TAZOBACTAM 3.375 G IVPB 30 MIN
3.3750 g | Freq: Once | INTRAVENOUS | Status: AC
  Administered 2016-05-06: 3.375 g via INTRAVENOUS
  Filled 2016-05-06: qty 50

## 2016-05-06 MED ORDER — INSULIN ASPART 100 UNIT/ML ~~LOC~~ SOLN
0.0000 [IU] | SUBCUTANEOUS | Status: DC
  Administered 2016-05-06: 3 [IU] via SUBCUTANEOUS
  Administered 2016-05-08 – 2016-05-09 (×4): 2 [IU] via SUBCUTANEOUS
  Filled 2016-05-06: qty 1

## 2016-05-06 MED ORDER — SODIUM CHLORIDE 0.9 % IV BOLUS (SEPSIS)
1200.0000 mL | Freq: Once | INTRAVENOUS | Status: AC
  Administered 2016-05-06: 1200 mL via INTRAVENOUS

## 2016-05-06 MED ORDER — DEXTROSE 50 % IV SOLN
1.0000 | Freq: Once | INTRAVENOUS | Status: AC
  Administered 2016-05-06: 50 mL via INTRAVENOUS
  Filled 2016-05-06: qty 50

## 2016-05-06 MED ORDER — SODIUM CHLORIDE 0.9 % IV SOLN
INTRAVENOUS | Status: DC
Start: 2016-05-06 — End: 2016-05-08
  Administered 2016-05-06: 23:00:00 via INTRAVENOUS
  Administered 2016-05-07: 100 mL/h via INTRAVENOUS

## 2016-05-06 MED ORDER — CLINDAMYCIN PHOSPHATE 900 MG/50ML IV SOLN
900.0000 mg | Freq: Three times a day (TID) | INTRAVENOUS | Status: DC
  Administered 2016-05-06 – 2016-05-09 (×8): 900 mg via INTRAVENOUS
  Filled 2016-05-06 (×10): qty 50

## 2016-05-06 NOTE — ED Notes (Signed)
Pt's temperature is 89.5. Nurse and MD notified.

## 2016-05-06 NOTE — H&P (Signed)
PULMONARY / CRITICAL CARE MEDICINE   Name: Angela Hays MRN: [id number] DOB: May 09, 1951    ADMISSION DATE:  05/06/2016 CONSULTATION DATE: 05/06/16  REFERRING MD: ED  CHIEF COMPLAINT:  Malaise, decreased appetite. Worsening pain and edema in LEs   HISTORY OF PRESENT ILLNESS:   Patient is a poor historian. History of present illness obtained from chart. No family at bedside.  65 year old with history of hypertension, diabetes, hyperlipidemia, rt CVA. Brought to the ED with malaise, decreased appetite, pain, edema in lower extremities.  Patient has chronic lower extremity venous ulcers and an admission in January 2017 for sepsis from lower extremity cellulitis.  In ED she was found to be tachycardic (110s), hypothermic (T 90.3). Hypotensive inspite of 30cc/kg fluid bolus. Also noted to be in acute kidney injury with K of 6, LA is normal. Noted to have a CT of the head with hyperdense focus over left frontal region ? SAH. She was given calcium, started on peripheral norepi and PCCM called for admission.   PAST MEDICAL HISTORY :  She  has a past medical history of Hypertension; Stroke ([place]); and Diabetes mellitus without complication ([place]).  PAST SURGICAL HISTORY: She  has no past surgical history on file.  No Known Allergies  No current facility-administered medications on file prior to encounter.   Current Outpatient Prescriptions on File Prior to Encounter  Medication Sig  . amLODipine (NORVASC) 10 MG tablet Take 5 mg by mouth daily with breakfast.  . cloNIDine (CATAPRES) 0.2 MG tablet Take 0.2 mg by mouth 3 (three) times daily.  . clopidogrel (PLAVIX) 75 MG tablet Take 75 mg by mouth daily.  Marland Kitchen doxazosin (CARDURA) 8 MG tablet Take 8 mg by mouth at bedtime.  Marland Kitchen doxycycline (VIBRA-TABS) 100 MG tablet Take 1 tablet (100 mg total) by mouth 2 (two) times daily.  . furosemide (LASIX) 40 MG tablet Take 80 mg by mouth daily with breakfast.  . hydrochlorothiazide (HYDRODIURIL) 25 MG tablet  Take 25 mg by mouth daily with breakfast.  . insulin glargine (LANTUS) 100 UNIT/ML injection Inject 15 Units into the skin every evening.  . labetalol (NORMODYNE) 200 MG tablet Take 200 mg by mouth 2 (two) times daily.  . metFORMIN (GLUCOPHAGE) 850 MG tablet Take 850 mg by mouth 2 (two) times daily with a meal.  . Multiple Minerals-Vitamins (NUTRA-SUPPORT BONE) CAPS Take 1 tablet by mouth daily.  . potassium chloride SA (K-DUR,KLOR-CON) 20 MEQ tablet Take 20 mEq by mouth daily with breakfast.  . simvastatin (ZOCOR) 20 MG tablet Take 20 mg by mouth daily with breakfast.  . valsartan (DIOVAN) 320 MG tablet Take 320 mg by mouth daily with breakfast.    FAMILY HISTORY:  Her has no family status information on file.   SOCIAL HISTORY: She  reports that she has never smoked. She does not have any smokeless tobacco history on file. She reports that she does not drink alcohol or use illicit drugs.  REVIEW OF SYSTEMS:   Unable to obtain as the patient is confused.  SUBJECTIVE:   VITAL SIGNS: BP 105/46 mmHg  Pulse 69  Temp(Src)   Resp 28  Wt 156 lb 8.4 oz (71 kg)  SpO2 100%  HEMODYNAMICS:    VENTILATOR SETTINGS:    INTAKE / OUTPUT: I/O last 3 completed shifts: In: -  Out: 250 [Urine:250]  PHYSICAL EXAMINATION: General:  Awake, confused. Neuro:  Contracture over arms. Moves all four extremities.  HEENT:  Swelling over the rt clavicle. ? Lipoma. Rt eye with  red conjunctivwe Cardiovascular:  Tachycardia, RRR, No MRG Lungs:  Clear, No wheeze or crackles Abdomen:  Soft, + BS Musculoskeletal:  Swelling, 2+ edema in bilateral lower extremities. Multiple large open wounds with purulent drainage and drainage, erythema. Skin: Warm and dry.   LABS:  BMET  Recent Labs Lab 05/06/16 1744  NA 140  K 6.0*  CL 113*  CO2 7*  BUN 185*  CREATININE 3.83*  GLUCOSE 55*    Electrolytes  Recent Labs Lab 05/06/16 1744  CALCIUM 8.1*    CBC  Recent Labs Lab 05/06/16 1744  WBC  11.9*  HGB 10.8*  HCT 33.9*  PLT 283    Coag's No results for input(s): APTT, INR in the last 168 hours.  Sepsis Markers  Recent Labs Lab 05/06/16 1743 05/06/16 2043  LATICACIDVEN 0.77 0.59    ABG No results for input(s): PHART, PCO2ART, PO2ART in the last 168 hours.  Liver Enzymes  Recent Labs Lab 05/06/16 1744  AST 36  ALT 35  ALKPHOS 49  BILITOT 0.6  ALBUMIN 2.5*    Cardiac Enzymes No results for input(s): TROPONINI, PROBNP in the last 168 hours.  Glucose  Recent Labs Lab 05/06/16 1859 05/06/16 1924 05/06/16 2038  GLUCAP 37* 135* 135*    Imaging Ct Head Wo Contrast  05/06/2016  CLINICAL DATA:  Increased slurred speech. Not eating. Stroke 2008 with bilateral deficits. EXAM: CT HEAD WITHOUT CONTRAST TECHNIQUE: Contiguous axial images were obtained from the base of the skull through the vertex without intravenous contrast. COMPARISON:  12/05/2007 FINDINGS: Ventricles and cisterns are within normal. There is mild chronic ischemic microvascular disease. Small old central periventricular and basal gangliar infarct. 1.5 cm hypodensity over the right cerebral hemisphere without mass effect and sub cm hypodensity over the left cerebral hemisphere likely old infarcts. No evidence of mass effect or shift of midline structures. No definite acute infarct. Calcification along the anterior interhemispheric fissure. There is an adjacent 3-4 mm hyperdense focus just left of midline over the left frontal region likely early calcification although cannot exclude a small focus of acute subarachnoid hemorrhage. Remaining bones and soft tissues are within normal. IMPRESSION: 3-4 mm hyperdense focus just left of midline over the frontal region which may be due to early calcification along the fissure, although cannot exclude a small focus of acute subarachnoid hemorrhage. Chronic ischemic microvascular disease and old small infarcts as described. These results were called by telephone at  the time of interpretation on 05/06/2016 at 6:32 pm to Dr. Tyrone Nine, who verbally acknowledged these results. Electronically Signed   By: Marin Olp M.D.   On: 05/06/2016 18:33   Dg Chest Portable 1 View  05/06/2016  CLINICAL DATA:  Sepsis, hypothermia, low blood pressure EXAM: PORTABLE CHEST 1 VIEW COMPARISON:  01/05/2016 FINDINGS: Cardiomediastinal silhouette is stable. The patient is rotated to the right. No infiltrate or pleural effusion. No pulmonary edema. Mild degenerative changes thoracic spine. IMPRESSION: No active disease. Electronically Signed   By: Lahoma Crocker M.D.   On: 05/06/2016 17:48     STUDIES:  CXR 5/12 > no active disease. CT head 5/12 >  3-4 mm hyperdense focus just left of midline over the frontal region which may be due to early calcification along the fissure, although cannot exclude a small focus of acute subarachnoid hemorrhage  CULTURES: Bcx 5/12 > Ucx 5/12 >  ANTIBIOTICS: Vanco 5/12 > Zosyn 5/12 > Clindamycin 5/12 >  SIGNIFICANT EVENTS:   LINES/TUBES:   DISCUSSION: 65 year old with past CVA, diabetes  mellitus. Admitted with septic shock, presumably from lower extremity cellulitis. Also noted to be in AKI with elevated potassium. Lactic acid is normal.  ASSESSMENT / PLAN:  PULMONARY A: Stable, able to protect the airway P:   Supplemental O2. Repeat CXR in AM  CARDIOVASCULAR A:  Tachycardia > from sepsis Septic shock on norepi Normal LA P:  Continue fluid resuscitation Trend lactic acid, troponins. Check BNP. CVC placement.  RENAL A:   AKI. Pre renal vs ATN Hyperkalemia. Given Ca. EKG with no hyperacute changes. AG metabolic acidosis.  P:   Monitor urine output and Cr with hydration. Recheck K  GASTROINTESTINAL A:   Stable P:   Keep NPO  HEMATOLOGIC A:   Leukocytosis from sepsis P:  Monitor  INFECTIOUS A:   Sepsis from LE wounds, cellulitis. Concern for nec fas P:   Continue vanco, zosyn Add clinidamycin Check  CK CT of LE  ENDOCRINE A:   DM  P:   Hold insulin Cover with SSI  NEUROLOGIC A:   Rt CVA ? SAH on CT P:   Continue to monitor neuro status. Consider repeat CT of head or MRI when more stable  FAMILY  - Updates: No family at bedside - Inter-disciplinary family meet or Palliative Care meeting due by:  5/19.  Critical care time- 35 mins.  Marshell Garfinkel MD [facility] Pager [phone] If no answer or after 3pm call: [phone] 05/06/2016, 8:58 PM

## 2016-05-06 NOTE — ED Notes (Signed)
Pt CBG, 37. Nurse was notified.

## 2016-05-06 NOTE — ED Notes (Signed)
Central line cart at bedside 

## 2016-05-06 NOTE — ED Provider Notes (Signed)
CSN: [id number]     Arrival date & time 05/06/16  1641 History   First MD Initiated Contact with Patient 05/06/16 1641     Chief Complaint  Patient presents with  . Aphasia     (Consider location/radiation/quality/duration/timing/severity/associated sxs/prior Treatment) Patient is a 65 y.o. female presenting with general illness. The history is provided by the patient, a relative and medical records.  Illness Location:  Altered mental status. Severity:  Moderate Onset quality:  Gradual Duration:  2 days Timing:  Constant Progression:  Worsening Chronicity:  New Context:  2 days of feeling bad. Decreased appetite. Slurred speech. Worsening edema and pain in bilateral lower extremities. Endorses chills. No other localizing infectious symptoms. No chest pain or shortness of breath. Associated symptoms: fatigue and rash   Associated symptoms: no abdominal pain, no chest pain, no cough, no diarrhea, no fever, no headaches, no nausea, no shortness of breath and no vomiting     Past Medical History  Diagnosis Date  . Hypertension   . Stroke ([place])   . Diabetes mellitus without complication ([place])    History reviewed. No pertinent past surgical history. History reviewed. No pertinent family history. Social History  Substance Use Topics  . Smoking status: Never Smoker   . Smokeless tobacco: None  . Alcohol Use: No   OB History    No data available     Review of Systems  Constitutional: Positive for activity change, appetite change and fatigue. Negative for fever and chills.  Respiratory: Negative for cough and shortness of breath.   Cardiovascular: Positive for leg swelling. Negative for chest pain.  Gastrointestinal: Negative for nausea, vomiting, abdominal pain and diarrhea.  Genitourinary: Negative for dysuria.  Musculoskeletal: Negative for back pain.  Skin: Positive for rash.  Neurological: Positive for speech difficulty. Negative for light-headedness and headaches.  All  other systems reviewed and are negative.     Allergies  Review of patient's allergies indicates no known allergies.  Home Medications   Prior to Admission medications   Medication Sig Start Date End Date Taking? Authorizing Provider  amLODipine (NORVASC) 10 MG tablet Take 5 mg by mouth daily with breakfast.    Historical Provider, MD  cloNIDine (CATAPRES) 0.2 MG tablet Take 0.2 mg by mouth 3 (three) times daily.    Historical Provider, MD  clopidogrel (PLAVIX) 75 MG tablet Take 75 mg by mouth daily.    Historical Provider, MD  doxazosin (CARDURA) 8 MG tablet Take 8 mg by mouth at bedtime.    Historical Provider, MD  doxycycline (VIBRA-TABS) 100 MG tablet Take 1 tablet (100 mg total) by mouth 2 (two) times daily. 01/07/16   Costin Karlyne Greenspan, MD  furosemide (LASIX) 40 MG tablet Take 80 mg by mouth daily with breakfast.    Historical Provider, MD  hydrochlorothiazide (HYDRODIURIL) 25 MG tablet Take 25 mg by mouth daily with breakfast.    Historical Provider, MD  insulin glargine (LANTUS) 100 UNIT/ML injection Inject 15 Units into the skin every evening.    Historical Provider, MD  labetalol (NORMODYNE) 200 MG tablet Take 200 mg by mouth 2 (two) times daily.    Historical Provider, MD  metFORMIN (GLUCOPHAGE) 850 MG tablet Take 850 mg by mouth 2 (two) times daily with a meal.    Historical Provider, MD  Multiple Minerals-Vitamins (NUTRA-SUPPORT BONE) CAPS Take 1 tablet by mouth daily.    Historical Provider, MD  potassium chloride SA (K-DUR,KLOR-CON) 20 MEQ tablet Take 20 mEq by mouth daily with breakfast.  Historical Provider, MD  simvastatin (ZOCOR) 20 MG tablet Take 20 mg by mouth daily with breakfast.    Historical Provider, MD  valsartan (DIOVAN) 320 MG tablet Take 320 mg by mouth daily with breakfast.    Historical Provider, MD   BP 103/45 mmHg  Pulse 113  Temp(Src)   Resp 15  SpO2 100% Physical Exam  Constitutional: She is oriented to person, place, and time. She appears  well-developed and well-nourished. No distress.  HENT:  Head: Normocephalic and atraumatic.  Mouth/Throat: Mucous membranes are dry.  White lesions on patient's tongue; able to scrape off  Eyes: EOM are normal. Pupils are equal, round, and reactive to light.  Neck: Normal range of motion.  Cardiovascular: Regular rhythm.  Tachycardia present.   Pulmonary/Chest: No tachypnea. No respiratory distress. She has no wheezes. She has no rhonchi.  Abdominal: Soft. Normal appearance. There is no tenderness. There is no rebound and no guarding.  Musculoskeletal:  Marked swelling and edema in bilateral lower extremities. Left greater than the right. Multiple large open sores with what appears to be purulent drainage from bilateral legs. Surrounding erythema. No induration. No fluctuance. Multiple large vesicles.  Neurological: She is alert and oriented to person, place, and time. GCS eye subscore is 4. GCS verbal subscore is 5. GCS motor subscore is 6.  Skin: Skin is warm and dry.    ED Course  .Central Line Date/Time: 05/06/2016 11:20 PM Performed by: Maryan Puls Authorized by: Maryan Puls Consent: Verbal consent obtained. Consent given by: patient Patient identity confirmed: verbally with patient and arm band Indications: vascular access Patient sedated: no Preparation: skin prepped with 2% chlorhexidine Skin prep agent dried: skin prep agent completely dried prior to procedure Sterile barriers: all five maximum sterile barriers used - cap, mask, sterile gown, sterile gloves, and large sterile sheet Hand hygiene: hand hygiene performed prior to central venous catheter insertion Location details: left internal jugular Patient position: Trendelenburg Catheter type: triple lumen Pre-procedure: landmarks identified Ultrasound guidance: yes Sterile ultrasound techniques: sterile gel and sterile probe covers were used Number of attempts: 1 Successful placement: yes Post-procedure: line  sutured and dressing applied Assessment: blood return through all ports,  no pneumothorax on x-ray,  placement verified by x-ray and free fluid flow Patient tolerance: Patient tolerated the procedure well with no immediate complications   (including critical care time) Labs Review Labs Reviewed  COMPREHENSIVE METABOLIC PANEL - Abnormal; Notable for the following:    Potassium 6.0 (*)    Chloride 113 (*)    CO2 7 (*)    Glucose, Bld 55 (*)    BUN 185 (*)    Creatinine, Ser 3.83 (*)    Calcium 8.1 (*)    Total Protein 5.4 (*)    Albumin 2.5 (*)    GFR calc non Af Amer 11 (*)    GFR calc Af Amer 13 (*)    Anion gap 20 (*)    All other components within normal limits  CBC WITH DIFFERENTIAL/PLATELET - Abnormal; Notable for the following:    WBC 11.9 (*)    Hemoglobin 10.8 (*)    HCT 33.9 (*)    RDW 16.2 (*)    Neutro Abs 10.8 (*)    All other components within normal limits  URINALYSIS, ROUTINE W REFLEX MICROSCOPIC (NOT AT [facility]) - Abnormal; Notable for the following:    APPearance HAZY (*)    Bilirubin Urine SMALL (*)    Ketones, ur 15 (*)    All  other components within normal limits  CBC WITH DIFFERENTIAL/PLATELET - Abnormal; Notable for the following:    WBC 13.8 (*)    Hemoglobin 10.8 (*)    HCT 33.6 (*)    RDW 16.1 (*)    Neutro Abs 12.7 (*)    Lymphs Abs 0.6 (*)    All other components within normal limits  PROTIME-INR - Abnormal; Notable for the following:    Prothrombin Time 17.4 (*)    All other components within normal limits  APTT - Abnormal; Notable for the following:    aPTT 39 (*)    All other components within normal limits  CBG MONITORING, ED - Abnormal; Notable for the following:    Glucose-Capillary 37 (*)    All other components within normal limits  CBG MONITORING, ED - Abnormal; Notable for the following:    Glucose-Capillary 135 (*)    All other components within normal limits  CBG MONITORING, ED - Abnormal; Notable for the following:     Glucose-Capillary 135 (*)    All other components within normal limits  CBG MONITORING, ED - Abnormal; Notable for the following:    Glucose-Capillary 161 (*)    All other components within normal limits  CULTURE, BLOOD (ROUTINE X 2)  CULTURE, BLOOD (ROUTINE X 2)  URINE CULTURE  CULTURE, RESPIRATORY (NON-EXPECTORATED)  CBC  COMPREHENSIVE METABOLIC PANEL  MAGNESIUM  PHOSPHORUS  TROPONIN I  TROPONIN I  TROPONIN I  LACTIC ACID, PLASMA  PROCALCITONIN  CORTISOL  CK  CBC  BASIC METABOLIC PANEL  MAGNESIUM  PHOSPHORUS  I-STAT CG4 LACTIC ACID, ED  I-STAT CG4 LACTIC ACID, ED  CBG MONITORING, ED    Imaging Review Ct Head Wo Contrast  05/06/2016  CLINICAL DATA:  Increased slurred speech. Not eating. Stroke 2008 with bilateral deficits. EXAM: CT HEAD WITHOUT CONTRAST TECHNIQUE: Contiguous axial images were obtained from the base of the skull through the vertex without intravenous contrast. COMPARISON:  12/05/2007 FINDINGS: Ventricles and cisterns are within normal. There is mild chronic ischemic microvascular disease. Small old central periventricular and basal gangliar infarct. 1.5 cm hypodensity over the right cerebral hemisphere without mass effect and sub cm hypodensity over the left cerebral hemisphere likely old infarcts. No evidence of mass effect or shift of midline structures. No definite acute infarct. Calcification along the anterior interhemispheric fissure. There is an adjacent 3-4 mm hyperdense focus just left of midline over the left frontal region likely early calcification although cannot exclude a small focus of acute subarachnoid hemorrhage. Remaining bones and soft tissues are within normal. IMPRESSION: 3-4 mm hyperdense focus just left of midline over the frontal region which may be due to early calcification along the fissure, although cannot exclude a small focus of acute subarachnoid hemorrhage. Chronic ischemic microvascular disease and old small infarcts as described.  These results were called by telephone at the time of interpretation on 05/06/2016 at 6:32 pm to Dr. Tyrone Nine, who verbally acknowledged these results. Electronically Signed   By: Marin Olp M.D.   On: 05/06/2016 18:33   Dg Chest Port 1 View  05/06/2016  CLINICAL DATA:  Central line placement EXAM: PORTABLE CHEST 1 VIEW COMPARISON:  05/06/2016 at 17:34 FINDINGS: There is a new left jugular central line with tip extending to the expected location of the cavoatrial junction. There is no pneumothorax. No other interval change. Visible lungs are clear. IMPRESSION: New left jugular central line.  No pneumothorax. Electronically Signed   By: Andreas Newport M.D.   On: 05/06/2016  23:38   Dg Chest Portable 1 View  05/06/2016  CLINICAL DATA:  Sepsis, hypothermia, low blood pressure EXAM: PORTABLE CHEST 1 VIEW COMPARISON:  01/05/2016 FINDINGS: Cardiomediastinal silhouette is stable. The patient is rotated to the right. No infiltrate or pleural effusion. No pulmonary edema. Mild degenerative changes thoracic spine. IMPRESSION: No active disease. Electronically Signed   By: Lahoma Crocker M.D.   On: 05/06/2016 17:48   I have personally reviewed and evaluated these images and lab results as part of my medical decision-making.   EKG Interpretation   Date/Time:  Friday May 06 2016 17:17:14 EDT Ventricular Rate:  58 PR Interval:  148 QRS Duration: 124 QT Interval:  502 QTC Calculation: 493 R Axis:   37 Text Interpretation:  Sinus rhythm Ventricular premature complex  Nonspecific intraventricular conduction delay Borderline ST elevation,  anterior leads Otherwise no significant change Confirmed by FLOYD MD,  Quillian Quince [id number]) on 05/06/2016 5:26:36 PM      MDM   Final diagnoses:  Status post PICC central line placement  Septic shock (HCC)  Cellulitis and abscess of leg  Pressure ulcer  Hyperkalemia  Leg swelling  Leukocytosis  AKI (acute kidney injury) [facility])    Patient is a 65 year old female  presenting with sepsis. Tachycardic and hypothermic to 42F. Most likely source is cutaneous. Starting the patient on vancomycin and Zosyn, same antibiotics that were given to the patient with a similar episode in 12/2015. Hypotensive here into the systolic [id number]. Given the patient 30 mL/kg IV fluids. May require pressors. Sending off sepsis labs including lactate and blood cultures. Getting a chest x-ray and CT head.  Potassium 6.0. No EKG changes. We'll give calcium but will not temporize. Significant elevation in creatinine from baseline. Leukocytosis. Anemia. Lactate normal. CT head shows question of subarachnoid hemorrhage; however the patient is not having any headache at this time. Patient too unstable for further intervention and evaluation at this time. Likely will require further evaluation as an inpatient. Chest x-ray with no evidence of pneumothorax or pneumonia.  19:02 Patient is now normotensive. On IVC evaluation there is minimal variation between inhalation and exhalation. Will likely not benefit from further volume. Will monitor for now, and will start pressors as needed should hypotension return.   20:30 Patient hypotensive again. Starting her on pressors. We'll admit to ICU.  22:30 At the request of ICU, I placed a central line as above. Follow-up chest x-ray showed correct placement.  Patient admitted in stable condition.  Maryan Puls, MD 05/06/16 [place], DO 05/06/16 2351

## 2016-05-06 NOTE — ED Notes (Signed)
Pt arrives from home via [facility]. Pt had a stroke in 2008 and has been wheelchair bound since rt left sided deficits. Son reported to [facility] the pt has been having increased slurred speech, anorexia, hasn't been taking her medications only her insulin and hasn't been eating. EMS noted bilateral leg wounds with purulent drainage and state the pt temperature read low.

## 2016-05-06 NOTE — Progress Notes (Signed)
Pharmacy Antibiotic Note  Angela Hays is a 65 y.o. female admitted on 05/06/2016 with cellulitis with concern for necrotizing fasciitis.  Pharmacy has been consulted for Vancomycin, Zosyn, and Clindamycin dosing.   WBC 11.9, hypothermic, LA 0.77, SCr 3.83 (BL ~ 1.1). CrCl ~ 15 mL/min   Plan: -Vancomycin 1500 mg IV once followed by Vancomycin 1 gm IV Q 48 hours -Zosyn 2.25 gm IV Q 8 hours -Clindamycin 900 mg IV Q 8 hours -Monitor CBC, renal fx, cultures, and clinical progress -VT at SS   Weight: 156 lb 8.4 oz (71 kg)  Temp (24hrs), Avg:90.3 F (32.4 C), Min:90.3 F (32.4 C), Max:90.3 F (32.4 C)   Recent Labs Lab 05/06/16 1743 05/06/16 1744 05/06/16 2043  WBC  --  11.9*  --   CREATININE  --  3.83*  --   LATICACIDVEN 0.77  --  0.59    Estimated Creatinine Clearance: 13.9 mL/min (by C-G formula based on Cr of 3.83).    No Known Allergies  Antimicrobials this admission: Vanc 5/12 >> Zosyn 5/12 >  Clinda 5/12 >>   Dose adjustments this admission: None   Microbiology results: 5/12 BCx2>> 5/12 UCx>>   Thank you for allowing pharmacy to be a part of this patient's care.  Albertina Parr, PharmD., BCPS Clinical Pharmacist Pager [phone]

## 2016-05-06 NOTE — ED Notes (Signed)
Central line being inserted at this time by Dr. Tyrone Nine and resident.

## 2016-05-06 NOTE — ED Notes (Signed)
Temp is 88.16, flowsheets will not allow to document rt temp being so low.

## 2016-05-07 ENCOUNTER — Inpatient Hospital Stay (HOSPITAL_COMMUNITY): Payer: Medicare Other

## 2016-05-07 DIAGNOSIS — N179 Acute kidney failure, unspecified: Secondary | ICD-10-CM

## 2016-05-07 DIAGNOSIS — R6521 Severe sepsis with septic shock: Secondary | ICD-10-CM

## 2016-05-07 DIAGNOSIS — L02419 Cutaneous abscess of limb, unspecified: Secondary | ICD-10-CM

## 2016-05-07 LAB — PHOSPHORUS: PHOSPHORUS: 7.1 mg/dL — AB (ref 2.5–4.6)

## 2016-05-07 LAB — CBC
HCT: 30.9 % — ABNORMAL LOW (ref 36.0–46.0)
HEMOGLOBIN: 10.4 g/dL — AB (ref 12.0–15.0)
MCH: 28.5 pg (ref 26.0–34.0)
MCHC: 33.7 g/dL (ref 30.0–36.0)
MCV: 84.7 fL (ref 78.0–100.0)
PLATELETS: 256 10*3/uL (ref 150–400)
RBC: 3.65 MIL/uL — ABNORMAL LOW (ref 3.87–5.11)
RDW: 15.9 % — ABNORMAL HIGH (ref 11.5–15.5)
WBC: 12.7 10*3/uL — ABNORMAL HIGH (ref 4.0–10.5)

## 2016-05-07 LAB — BASIC METABOLIC PANEL
Anion gap: 13 (ref 5–15)
BUN: 154 mg/dL — AB (ref 6–20)
CALCIUM: 6.9 mg/dL — AB (ref 8.9–10.3)
CHLORIDE: 121 mmol/L — AB (ref 101–111)
CO2: 8 mmol/L — ABNORMAL LOW (ref 22–32)
Creatinine, Ser: 2.82 mg/dL — ABNORMAL HIGH (ref 0.44–1.00)
GFR, EST AFRICAN AMERICAN: 19 mL/min — AB (ref >60)
GFR, EST NON AFRICAN AMERICAN: 16 mL/min — AB (ref >60)
Glucose, Bld: 92 mg/dL (ref 65–99)
Potassium: 4.9 mmol/L (ref 3.5–5.1)
Sodium: 142 mmol/L (ref 135–145)

## 2016-05-07 LAB — GLUCOSE, CAPILLARY
GLUCOSE-CAPILLARY: 106 mg/dL — AB (ref 65–99)
GLUCOSE-CAPILLARY: 98 mg/dL (ref 65–99)
Glucose-Capillary: 92 mg/dL (ref 65–99)
Glucose-Capillary: 67 mg/dL (ref 65–99)
Glucose-Capillary: 64 mg/dL — ABNORMAL LOW (ref 65–99)

## 2016-05-07 LAB — PROCALCITONIN: PROCALCITONIN: 0.16 ng/mL

## 2016-05-07 LAB — MAGNESIUM: MAGNESIUM: 1.7 mg/dL (ref 1.7–2.4)

## 2016-05-07 LAB — MRSA PCR SCREENING: MRSA by PCR: POSITIVE — AB

## 2016-05-07 LAB — TROPONIN I: TROPONIN I: 0.05 ng/mL — AB (ref <0.031)

## 2016-05-07 LAB — CORTISOL: Cortisol, Plasma: 25.1 ug/dL

## 2016-05-07 MED ORDER — NOREPINEPHRINE BITARTRATE 1 MG/ML IV SOLN
0.0000 ug/min | INTRAVENOUS | Status: DC
  Administered 2016-05-07: 15 ug/min via INTRAVENOUS
  Administered 2016-05-07: 30 ug/min via INTRAVENOUS
  Filled 2016-05-07 (×3): qty 16

## 2016-05-07 MED ORDER — SODIUM ACETATE 2 MEQ/ML IV SOLN
INTRAVENOUS | Status: DC
  Administered 2016-05-07 – 2016-05-08 (×4): via INTRAVENOUS
  Filled 2016-05-07 (×5): qty 1000

## 2016-05-07 MED ORDER — SODIUM CHLORIDE 0.9 % IV SOLN
0.0300 [IU]/min | INTRAVENOUS | Status: DC
  Administered 2016-05-07: 0.03 [IU]/min via INTRAVENOUS
  Filled 2016-05-07: qty 2

## 2016-05-07 MED ORDER — MUPIROCIN 2 % EX OINT
1.0000 "application " | TOPICAL_OINTMENT | Freq: Two times a day (BID) | CUTANEOUS | Status: AC
  Administered 2016-05-07 – 2016-05-11 (×10): 1 via NASAL
  Filled 2016-05-07 (×2): qty 22

## 2016-05-07 MED ORDER — SODIUM CHLORIDE 0.9% FLUSH
10.0000 mL | Freq: Two times a day (BID) | INTRAVENOUS | Status: DC
  Administered 2016-05-07 – 2016-05-08 (×3): 10 mL

## 2016-05-07 MED ORDER — SODIUM CHLORIDE 0.9% FLUSH
10.0000 mL | INTRAVENOUS | Status: DC | PRN
  Administered 2016-05-12: 10 mL
  Filled 2016-05-07: qty 40

## 2016-05-07 MED ORDER — CHLORHEXIDINE GLUCONATE CLOTH 2 % EX PADS
6.0000 | MEDICATED_PAD | Freq: Every day | CUTANEOUS | Status: AC
  Administered 2016-05-08 – 2016-05-11 (×4): 6 via TOPICAL

## 2016-05-07 NOTE — Progress Notes (Signed)
PULMONARY / CRITICAL CARE MEDICINE   Name: Angela Hays MRN: [id number] DOB: 1951-03-13    ADMISSION DATE:  05/06/2016 CONSULTATION DATE: 05/06/16  REFERRING MD: ED  CHIEF COMPLAINT:  Malaise, decreased appetite. Worsening pain and edema in LEs   HISTORY OF PRESENT ILLNESS:   Patient is a poor historian. History of present illness obtained from chart. No family at bedside.  65 year old with history of hypertension, diabetes, hyperlipidemia, rt CVA. Brought to the ED with malaise, decreased appetite, pain, edema in lower extremities.  Patient has chronic lower extremity venous ulcers and an admission in January 2017 for sepsis from lower extremity cellulitis.  In ED she was found to be tachycardic (110s), hypothermic (T 90.3). Hypotensive inspite of 30cc/kg fluid bolus. Also noted to be in acute kidney injury with K of 6, LA is normal. Noted to have a CT of the head with hyperdense focus over left frontal region ? SAH. She was given calcium, started on peripheral norepi and PCCM called for admission.    SUBJECTIVE:  Afebrile Critically ill on levo gtt Good UO  VITAL SIGNS: BP 122/60 mmHg  Pulse 99  Temp(Src) 98.8 F (37.1 C)  Resp 23  Ht 5\' 3"  (1.6 m)  Wt 158 lb 8.2 oz (71.9 kg)  BMI 28.09 kg/m2  SpO2 100%  HEMODYNAMICS:    VENTILATOR SETTINGS:    INTAKE / OUTPUT: I/O last 3 completed shifts: In: 3534.5 [I.V.:3384.5; IV Piggyback:150] Out: 970 [Urine:970]  PHYSICAL EXAMINATION: General:  Awake, confused. Neuro:  Contracture over arms. Moves all four extremities.  HEENT:  Swelling over the rt clavicle. ? Lipoma. Rt eye with red conjunctivwe Cardiovascular:  Tachycardia, RRR, No MRG Lungs:  Clear, No wheeze or crackles Abdomen:  Soft, + BS Musculoskeletal:  Swelling, 2+ edema in bilateral lower extremities. Multiple large open wounds with purulent drainage and drainage, erythema. Skin: Warm and dry.   LABS:  BMET  Recent Labs Lab 05/06/16 1744  05/06/16 2300 05/07/16 0310  NA 140 140 142  K 6.0* 5.5* 4.9  CL 113* 116* 121*  CO2 7* 7* 8*  BUN 185* 165* 154*  CREATININE 3.83* 3.28* 2.82*  GLUCOSE 55* 178* 92    Electrolytes  Recent Labs Lab 05/06/16 1744 05/06/16 2300 05/07/16 0310  CALCIUM 8.1* 7.4* 6.9*  MG  --  2.0 1.7  PHOS  --  8.4* 7.1*    CBC  Recent Labs Lab 05/06/16 1744 05/06/16 2300 05/07/16 0310  WBC 11.9* 13.8* 12.7*  HGB 10.8* 10.8* 10.4*  HCT 33.9* 33.6* 30.9*  PLT 283 296 256    Coag's  Recent Labs Lab 05/06/16 2300  APTT 39*  INR 1.42    Sepsis Markers  Recent Labs Lab 05/06/16 1743 05/06/16 2043 05/06/16 2300  LATICACIDVEN 0.77 0.59 0.8  PROCALCITON  --   --  0.16    ABG No results for input(s): PHART, PCO2ART, PO2ART in the last 168 hours.  Liver Enzymes  Recent Labs Lab 05/06/16 1744 05/06/16 2300  AST 36 41  ALT 35 36  ALKPHOS 49 49  BILITOT 0.6 0.9  ALBUMIN 2.5* 2.2*    Cardiac Enzymes  Recent Labs Lab 05/06/16 2300 05/07/16 0310  TROPONINI 0.03 0.05*    Glucose  Recent Labs Lab 05/06/16 1859 05/06/16 1924 05/06/16 2038 05/06/16 2302 05/07/16 0310 05/07/16 0754  GLUCAP 37* 135* 135* 161* 92 67    Imaging Ct Head Wo Contrast  05/06/2016  CLINICAL DATA:  Increased slurred speech. Not eating. Stroke 2008 with  bilateral deficits. EXAM: CT HEAD WITHOUT CONTRAST TECHNIQUE: Contiguous axial images were obtained from the base of the skull through the vertex without intravenous contrast. COMPARISON:  12/05/2007 FINDINGS: Ventricles and cisterns are within normal. There is mild chronic ischemic microvascular disease. Small old central periventricular and basal gangliar infarct. 1.5 cm hypodensity over the right cerebral hemisphere without mass effect and sub cm hypodensity over the left cerebral hemisphere likely old infarcts. No evidence of mass effect or shift of midline structures. No definite acute infarct. Calcification along the anterior  interhemispheric fissure. There is an adjacent 3-4 mm hyperdense focus just left of midline over the left frontal region likely early calcification although cannot exclude a small focus of acute subarachnoid hemorrhage. Remaining bones and soft tissues are within normal. IMPRESSION: 3-4 mm hyperdense focus just left of midline over the frontal region which may be due to early calcification along the fissure, although cannot exclude a small focus of acute subarachnoid hemorrhage. Chronic ischemic microvascular disease and old small infarcts as described. These results were called by telephone at the time of interpretation on 05/06/2016 at 6:32 pm to Dr. Tyrone Nine, who verbally acknowledged these results. Electronically Signed   By: Marin Olp M.D.   On: 05/06/2016 18:33   Dg Chest Port 1 View  05/06/2016  CLINICAL DATA:  Central line placement EXAM: PORTABLE CHEST 1 VIEW COMPARISON:  05/06/2016 at 17:34 FINDINGS: There is a new left jugular central line with tip extending to the expected location of the cavoatrial junction. There is no pneumothorax. No other interval change. Visible lungs are clear. IMPRESSION: New left jugular central line.  No pneumothorax. Electronically Signed   By: Andreas Newport M.D.   On: 05/06/2016 23:38   Dg Chest Portable 1 View  05/06/2016  CLINICAL DATA:  Sepsis, hypothermia, low blood pressure EXAM: PORTABLE CHEST 1 VIEW COMPARISON:  01/05/2016 FINDINGS: Cardiomediastinal silhouette is stable. The patient is rotated to the right. No infiltrate or pleural effusion. No pulmonary edema. Mild degenerative changes thoracic spine. IMPRESSION: No active disease. Electronically Signed   By: Lahoma Crocker M.D.   On: 05/06/2016 17:48   Ct Extrem Lower Wo Cm Bil  05/07/2016  CLINICAL DATA:  65 year old female with bilateral lower extremity cellulitis. EXAM: CT OF THE LOWER BILATERAL EXTREMITY WITHOUT CONTRAST TECHNIQUE: Multidetector CT imaging of the bilateral lower extremities below the  knee was performed according to the standard protocol. COMPARISON:  None. FINDINGS: Evaluation of this exam is limited in the absence of intravenous contrast. There is no acute fracture or dislocation. There is mild osteopenia. No joint effusion. Extensive bilateral skin thickening and diffuse stranding of the subcutaneous soft tissues involving the calves and feet, left greater right compatible with known cellulitis. No drainable fluid collection or abscess identified, however evaluation is limited in the absence of intravenous contrast. No soft tissue gas noted. IMPRESSION: Extensive cellulitis of the thumb bilateral lower extremities, left greater right. No soft tissue gas or fluid collection. Electronically Signed   By: Anner Crete M.D.   On: 05/07/2016 00:30     STUDIES:  CXR 5/12 > no active disease. CT head 5/12 >  3-4 mm hyperdense focus just left of midline over the frontal region which may be due to early calcification along the fissure, although cannot exclude a small focus of acute subarachnoid hemorrhage  CULTURES: Bcx 5/12 > Ucx 5/12 >  ANTIBIOTICS: Vanco 5/12 > Zosyn 5/12 > Clindamycin 5/12 >  SIGNIFICANT EVENTS:   LINES/TUBES: [facility] 5/12 >>  DISCUSSION: 65 year old with past CVA, diabetes mellitus. Admitted with septic shock, presumably from lower extremity cellulitis. Also noted to be in AKI with elevated potassium. Lactic acid is normal.  ASSESSMENT / PLAN:  PULMONARY A: Stable, able to protect the airway P:   Supplemental O2.  CARDIOVASCULAR A:  Tachycardia > from sepsis Septic shock on norepi Normal LA P:  Taper levo gtt  add vaso gtt   RENAL A:   AKI. Pre renal vs ATN Hyperkalemia. Given Ca. EKG with no hyperacute changes -improved AG metabolic acidosis.  P:   Monitor urine output and Cr with hydration.   GASTROINTESTINAL A:   Stable P:   Advance clear liquids  HEMATOLOGIC A:   Leukocytosis from sepsis P:  SCDs No heparin  since possible SAH  INFECTIOUS A:   Sepsis from LE wounds, cellulitis. CT neg nec fas P:   Continue vanco, zosyn Add clinidamycin    ENDOCRINE A:   DM  P:   Hold insulin Cover with SSI  NEUROLOGIC A:   Rt CVA ? SAH on CT P:    monitor neuro status -stable Consider repeat CT of head or MRI when more stable  FAMILY  - Updates: No family at bedside - Inter-disciplinary family meet or Palliative Care meeting due by:  5/19.  Critical care time- 30 mins.  Kara Mead MD. Shade Flood. [facility] Pager [phone] If no response call [phone]   05/07/2016    05/07/2016, 10:35 AM

## 2016-05-08 DIAGNOSIS — G9340 Encephalopathy, unspecified: Secondary | ICD-10-CM

## 2016-05-08 LAB — BASIC METABOLIC PANEL
ANION GAP: 12 (ref 5–15)
Anion gap: 14 (ref 5–15)
BUN: 88 mg/dL — AB (ref 6–20)
BUN: 57 mg/dL — AB (ref 6–20)
CALCIUM: 7.6 mg/dL — AB (ref 8.9–10.3)
CHLORIDE: 121 mmol/L — AB (ref 101–111)
CO2: 21 mmol/L — ABNORMAL LOW (ref 22–32)
CO2: 26 mmol/L (ref 22–32)
Calcium: 7.7 mg/dL — ABNORMAL LOW (ref 8.9–10.3)
Chloride: 120 mmol/L — ABNORMAL HIGH (ref 101–111)
Creatinine, Ser: 1.61 mg/dL — ABNORMAL HIGH (ref 0.44–1.00)
Creatinine, Ser: 1.35 mg/dL — ABNORMAL HIGH (ref 0.44–1.00)
GFR calc Af Amer: 37 mL/min — ABNORMAL LOW (ref >60)
GFR calc Af Amer: 46 mL/min — ABNORMAL LOW (ref >60)
GFR calc non Af Amer: 32 mL/min — ABNORMAL LOW (ref >60)
GFR, EST NON AFRICAN AMERICAN: 40 mL/min — AB (ref >60)
GLUCOSE: 104 mg/dL — AB (ref 65–99)
GLUCOSE: 111 mg/dL — AB (ref 65–99)
POTASSIUM: 3.4 mmol/L — AB (ref 3.5–5.1)
Potassium: 3.3 mmol/L — ABNORMAL LOW (ref 3.5–5.1)
Sodium: 156 mmol/L — ABNORMAL HIGH (ref 135–145)
Sodium: 158 mmol/L — ABNORMAL HIGH (ref 135–145)

## 2016-05-08 LAB — URINE CULTURE: Culture: <10000 — AB

## 2016-05-08 LAB — PHOSPHORUS: Phosphorus: 3.4 mg/dL (ref 2.5–4.6)

## 2016-05-08 LAB — GLUCOSE, CAPILLARY
GLUCOSE-CAPILLARY: 136 mg/dL — AB (ref 65–99)
Glucose-Capillary: 91 mg/dL (ref 65–99)
Glucose-Capillary: 142 mg/dL — ABNORMAL HIGH (ref 65–99)
Glucose-Capillary: 102 mg/dL — ABNORMAL HIGH (ref 65–99)
Glucose-Capillary: 130 mg/dL — ABNORMAL HIGH (ref 65–99)

## 2016-05-08 LAB — MAGNESIUM: MAGNESIUM: 1.6 mg/dL — AB (ref 1.7–2.4)

## 2016-05-08 MED ORDER — POTASSIUM CHLORIDE 10 MEQ/50ML IV SOLN
10.0000 meq | INTRAVENOUS | Status: DC
  Administered 2016-05-08 (×3): 10 meq via INTRAVENOUS
  Filled 2016-05-08 (×3): qty 50

## 2016-05-08 MED ORDER — DEXTROSE 5 % IV SOLN
INTRAVENOUS | Status: DC
  Administered 2016-05-08: 18:00:00 via INTRAVENOUS

## 2016-05-08 MED ORDER — SODIUM ACETATE 2 MEQ/ML IV SOLN
INTRAVENOUS | Status: DC
  Administered 2016-05-08: 13:00:00 via INTRAVENOUS
  Filled 2016-05-08 (×2): qty 1000

## 2016-05-08 MED ORDER — HEPARIN SODIUM (PORCINE) 5000 UNIT/ML IJ SOLN
5000.0000 [IU] | Freq: Three times a day (TID) | INTRAMUSCULAR | Status: DC
  Administered 2016-05-08 – 2016-05-09 (×4): 5000 [IU] via SUBCUTANEOUS
  Filled 2016-05-08 (×3): qty 1

## 2016-05-08 MED ORDER — VANCOMYCIN HCL IN DEXTROSE 1-5 GM/200ML-% IV SOLN
1000.0000 mg | INTRAVENOUS | Status: DC
  Administered 2016-05-08 – 2016-05-10 (×3): 1000 mg via INTRAVENOUS
  Filled 2016-05-08 (×4): qty 200

## 2016-05-08 MED ORDER — MAGNESIUM SULFATE 2 GM/50ML IV SOLN
2.0000 g | Freq: Once | INTRAVENOUS | Status: AC
  Administered 2016-05-08: 2 g via INTRAVENOUS
  Filled 2016-05-08: qty 50

## 2016-05-08 MED ORDER — PIPERACILLIN-TAZOBACTAM 3.375 G IVPB
3.3750 g | Freq: Three times a day (TID) | INTRAVENOUS | Status: DC
  Administered 2016-05-08 – 2016-05-10 (×7): 3.375 g via INTRAVENOUS
  Filled 2016-05-08 (×10): qty 50

## 2016-05-08 MED ORDER — ALBUTEROL SULFATE (2.5 MG/3ML) 0.083% IN NEBU
INHALATION_SOLUTION | RESPIRATORY_TRACT | Status: AC
  Administered 2016-05-08: 07:00:00
  Filled 2016-05-08: qty 3

## 2016-05-08 NOTE — Progress Notes (Signed)
eLink Physician-Brief Progress Note Patient Name: Angela Hays DOB: 12/13/1951 MRN: [id number]   Date of Service  05/08/2016  HPI/Events of Note  Needs DVT prophylaxis.  eICU Interventions  SQ heparin ordered.     Intervention Category Major Interventions: Other:  Uvaldo Rybacki 05/08/2016, 4:07 PM

## 2016-05-08 NOTE — Progress Notes (Signed)
CRITICAL VALUE ALERT  Critical value received:  Positive Blood cultures pediatric tube gram negative rods   Date of notification:  05/08/16  Time of notification:  2345  Critical value read back:yes  Nurse who received alert:  Deboraha Sprang, RN   MD notified (1st page):  Dr. Jimmy Footman  Time of first page:  2350

## 2016-05-08 NOTE — Progress Notes (Signed)
Pharmacy Antibiotic Note  Angela Hays is a 65 y.o. female admitted on 05/06/2016 with severe cellulitis.  Pharmacy has been consulted for vancomycin, Zosyn and clindamycin dosing.  CT negative for soft tissue gas or fluid collection.  Patient's renal function is improving.  Plan: - Change vanc to 1gm IV Q24H - Continue Clinda 900mg  IV Q8H - Change Zosyn 3.375gm IV Q8H, 4 hr infusion - Monitor renal fxn, micro data, de-escalate abx - F/U adjust IVF d/t hypernatremia  Height: 5\' 3"  (160 cm) Weight: 148 lb 13 oz (67.5 kg) IBW/kg (Calculated) : 52.4  Temp (24hrs), Avg:99.5 F (37.5 C), Min:98.8 F (37.1 C), Max:100.2 F (37.9 C)   Recent Labs Lab 05/06/16 1743 05/06/16 1744 05/06/16 2043 05/06/16 2300 05/07/16 0310 05/08/16 0426  WBC  --  11.9*  --  13.8* 12.7*  --   CREATININE  --  3.83*  --  3.28* 2.82* 1.61*  LATICACIDVEN 0.77  --  0.59 0.8  --   --     Estimated Creatinine Clearance: 31.3 mL/min (by C-G formula based on Cr of 1.61).    No Known Allergies  Antimicrobials this admission: Vanc 5/12 >> Zosyn 5/12 >> Clinda 5/12 >> Doxy PTA  Dose adjustments this admission: N/A  Microbiology results: 5/12 BCx x2 - GNR 5/12 UCx - negative 5/13 MRSA PCR - positive   Angela Hays, PharmD, BCPS Pager:  [phone] 05/08/2016, 9:52 AM

## 2016-05-08 NOTE — Progress Notes (Signed)
PULMONARY / CRITICAL CARE MEDICINE   Name: Angela Hays MRN: [id number] DOB: Nov 27, 1951    ADMISSION DATE:  05/06/2016 CONSULTATION DATE: 05/06/16  REFERRING MD: ED  CHIEF COMPLAINT:  Malaise, decreased appetite. Worsening pain and edema in LEs   HISTORY OF PRESENT ILLNESS:    65 year old with history of hypertension, diabetes, hyperlipidemia, rt CVA. Brought to the ED with malaise, decreased appetite, pain, edema in lower extremities.  Patient has chronic lower extremity venous ulcers and an admission in January 2017 for sepsis from lower extremity cellulitis.  In ED she was found to be tachycardic (110s), hypothermic (T 90.3). Hypotensive inspite of 30cc/kg fluid bolus. Also noted to be in acute kidney injury with K of 6, LA is normal. Noted to have a CT of the head with hyperdense focus over left frontal region ? SAH. She was given calcium, started on peripheral norepi and PCCM called for admission.    SUBJECTIVE:  Low gr febrile Off levo gtt Good UO Per RN , keeps taking clothes off & does not remember  VITAL SIGNS: BP 177/142 mmHg  Pulse 95  Temp(Src) 100.2 F (37.9 C)  Resp 25  Ht 5\' 3"  (1.6 m)  Wt 148 lb 13 oz (67.5 kg)  BMI 26.37 kg/m2  SpO2 96%  HEMODYNAMICS:    VENTILATOR SETTINGS:    INTAKE / OUTPUT: I/O last 3 completed shifts: In: 7581.8 [I.V.:7131.8; IV Piggyback:450] Out: [id number]  PHYSICAL EXAMINATION: General:  Awake, confused. Neuro:  Contracture over arms. Moves all four extremities. Oriented to place, person & time HEENT:  Lipoma. over the rt clavicle. Rt eye with red conjunctivwe Cardiovascular:  Tachycardia, RRR, No MRG Lungs:  Clear, No wheeze or crackles Abdomen:  Soft, + BS Musculoskeletal:  Swelling, 2+ edema in bilateral lower extremities. Multiple large open wounds with purulent drainage and drainage, erythema. Skin: Warm and dry.   LABS:  BMET  Recent Labs Lab 05/06/16 2300 05/07/16 0310 05/08/16 0426  NA 140 142  156*  K 5.5* 4.9 3.4*  CL 116* 121* 121*  CO2 7* 8* 21*  BUN 165* 154* 88*  CREATININE 3.28* 2.82* 1.61*  GLUCOSE 178* 92 104*    Electrolytes  Recent Labs Lab 05/06/16 2300 05/07/16 0310 05/08/16 0426  CALCIUM 7.4* 6.9* 7.7*  MG 2.0 1.7 1.6*  PHOS 8.4* 7.1* 3.4    CBC  Recent Labs Lab 05/06/16 1744 05/06/16 2300 05/07/16 0310  WBC 11.9* 13.8* 12.7*  HGB 10.8* 10.8* 10.4*  HCT 33.9* 33.6* 30.9*  PLT 283 296 256    Coag's  Recent Labs Lab 05/06/16 2300  APTT 39*  INR 1.42    Sepsis Markers  Recent Labs Lab 05/06/16 1743 05/06/16 2043 05/06/16 2300  LATICACIDVEN 0.77 0.59 0.8  PROCALCITON  --   --  0.16    ABG No results for input(s): PHART, PCO2ART, PO2ART in the last 168 hours.  Liver Enzymes  Recent Labs Lab 05/06/16 1744 05/06/16 2300  AST 36 41  ALT 35 36  ALKPHOS 49 49  BILITOT 0.6 0.9  ALBUMIN 2.5* 2.2*    Cardiac Enzymes  Recent Labs Lab 05/06/16 2300 05/07/16 0310  TROPONINI 0.03 0.05*    Glucose  Recent Labs Lab 05/07/16 1130 05/07/16 1613 05/07/16 1933 05/08/16 0008 05/08/16 0401 05/08/16 0809  GLUCAP 64* 106* 98 136* 91 142*    Imaging No results found.   STUDIES:  CXR 5/12 > no active disease. CT head 5/12 >  3-4 mm hyperdense focus just left  of midline over the frontal region which may be due to early calcification along the fissure, although cannot exclude a small focus of acute subarachnoid hemorrhage  CULTURES: Bcx 5/12 > GNR >> Ucx 5/12 >  ANTIBIOTICS: Vanco 5/12 > Zosyn 5/12 > Clindamycin 5/12 > 5/14  SIGNIFICANT EVENTS:   LINES/TUBES: LIJ 5/12 >>  DISCUSSION: 64 year old with past CVA, diabetes mellitus. Admitted with septic shock, presumably from lower extremity cellulitis. Also noted to be in AKI with elevated potassium. Lactic acid is normal.  ASSESSMENT / PLAN:  PULMONARY A: Stable, able to protect the airway P:   Supplemental O2.  CARDIOVASCULAR A:  Tachycardia >  from sepsis Septic shock  Normal LA P:  Off pressors   RENAL A:   AKI. Pre renal vs ATN Hyperkalemia. Given Ca. EKG with no hyperacute changes -improved AG metabolic acidosis.  Hypernatremia P:   Monitor urine output and Cr with hydration -polyuric phase repelte lytes   GASTROINTESTINAL A:   Stable P:   Advance PO  HEMATOLOGIC A:   Leukocytosis from sepsis P:  SCDs No heparin since possible SAH  INFECTIOUS A:   Sepsis from LE wounds, cellulitis. CT neg nec fas P:   Continue vanco, zosyn dc clinidamycin Await ID of GNR     ENDOCRINE A:   DM  P:   Cover with SSI  NEUROLOGIC A:   Rt CVA ? SAH on CT Acute delerium - related to sepsis/ metabolic issues P:    monitor neuro status  Consider repeat CT of head if remains confused after metabolic issues resolved  Transfer to SDU & triad 5/15  FAMILY  - Updates: son [name] 5/14  at bedside - Inter-disciplinary family meet or Palliative Care meeting due by:  5/19.  Critical care time- 30 mins.  Kara Mead MD. [place] Flood. [facility] Pager [phone] If no response call [phone]   05/08/2016    05/08/2016, 11:29 AM

## 2016-05-09 DIAGNOSIS — E876 Hypokalemia: Secondary | ICD-10-CM

## 2016-05-09 DIAGNOSIS — R410 Disorientation, unspecified: Secondary | ICD-10-CM

## 2016-05-09 LAB — CBC
HCT: 27.5 % — ABNORMAL LOW (ref 36.0–46.0)
Hemoglobin: 9 g/dL — ABNORMAL LOW (ref 12.0–15.0)
MCH: 27.4 pg (ref 26.0–34.0)
MCHC: 32.7 g/dL (ref 30.0–36.0)
MCV: 83.8 fL (ref 78.0–100.0)
PLATELETS: 197 10*3/uL (ref 150–400)
RBC: 3.28 MIL/uL — ABNORMAL LOW (ref 3.87–5.11)
RDW: 16.3 % — AB (ref 11.5–15.5)
WBC: 8.3 10*3/uL (ref 4.0–10.5)

## 2016-05-09 LAB — GLUCOSE, CAPILLARY
GLUCOSE-CAPILLARY: 125 mg/dL — AB (ref 65–99)
GLUCOSE-CAPILLARY: 140 mg/dL — AB (ref 65–99)
GLUCOSE-CAPILLARY: 235 mg/dL — AB (ref 65–99)
GLUCOSE-CAPILLARY: 169 mg/dL — AB (ref 65–99)
Glucose-Capillary: 100 mg/dL — ABNORMAL HIGH (ref 65–99)
Glucose-Capillary: 107 mg/dL — ABNORMAL HIGH (ref 65–99)
Glucose-Capillary: 129 mg/dL — ABNORMAL HIGH (ref 65–99)

## 2016-05-09 LAB — BASIC METABOLIC PANEL
Anion gap: 5 (ref 5–15)
BUN: 45 mg/dL — AB (ref 6–20)
CALCIUM: 7.5 mg/dL — AB (ref 8.9–10.3)
CO2: 28 mmol/L (ref 22–32)
CREATININE: 1.27 mg/dL — AB (ref 0.44–1.00)
Chloride: 121 mmol/L — ABNORMAL HIGH (ref 101–111)
GFR calc Af Amer: 49 mL/min — ABNORMAL LOW (ref >60)
GFR, EST NON AFRICAN AMERICAN: 43 mL/min — AB (ref >60)
GLUCOSE: 128 mg/dL — AB (ref 65–99)
Potassium: 3.1 mmol/L — ABNORMAL LOW (ref 3.5–5.1)
Sodium: 154 mmol/L — ABNORMAL HIGH (ref 135–145)

## 2016-05-09 LAB — PHOSPHORUS: Phosphorus: 2.6 mg/dL (ref 2.5–4.6)

## 2016-05-09 LAB — MAGNESIUM: Magnesium: 2.1 mg/dL (ref 1.7–2.4)

## 2016-05-09 MED ORDER — POTASSIUM CHLORIDE CRYS ER 20 MEQ PO TBCR
40.0000 meq | EXTENDED_RELEASE_TABLET | Freq: Once | ORAL | Status: AC
  Administered 2016-05-09: 40 meq via ORAL
  Filled 2016-05-09: qty 2

## 2016-05-09 MED ORDER — INSULIN ASPART 100 UNIT/ML ~~LOC~~ SOLN
0.0000 [IU] | Freq: Every day | SUBCUTANEOUS | Status: DC
  Administered 2016-05-11: 2 [IU] via SUBCUTANEOUS

## 2016-05-09 MED ORDER — PNEUMOCOCCAL VAC POLYVALENT 25 MCG/0.5ML IJ INJ
0.5000 mL | INJECTION | INTRAMUSCULAR | Status: AC
  Administered 2016-05-10: 0.5 mL via INTRAMUSCULAR
  Filled 2016-05-09: qty 0.5

## 2016-05-09 MED ORDER — ALBUTEROL SULFATE (2.5 MG/3ML) 0.083% IN NEBU
2.5000 mg | INHALATION_SOLUTION | RESPIRATORY_TRACT | Status: DC | PRN
  Administered 2016-05-09: 2.5 mg via RESPIRATORY_TRACT
  Filled 2016-05-09: qty 3

## 2016-05-09 MED ORDER — CLOPIDOGREL BISULFATE 75 MG PO TABS
75.0000 mg | ORAL_TABLET | Freq: Every day | ORAL | Status: DC
  Administered 2016-05-09: 75 mg via ORAL
  Filled 2016-05-09: qty 1

## 2016-05-09 MED ORDER — OXYCODONE-ACETAMINOPHEN 5-325 MG PO TABS
1.0000 | ORAL_TABLET | Freq: Four times a day (QID) | ORAL | Status: DC | PRN
  Administered 2016-05-09: 1 via ORAL
  Administered 2016-05-10 – 2016-05-15 (×2): 2 via ORAL
  Filled 2016-05-09: qty 1
  Filled 2016-05-09 (×2): qty 2

## 2016-05-09 MED ORDER — INSULIN ASPART 100 UNIT/ML ~~LOC~~ SOLN
0.0000 [IU] | Freq: Three times a day (TID) | SUBCUTANEOUS | Status: DC
  Administered 2016-05-09: 5 [IU] via SUBCUTANEOUS
  Administered 2016-05-10: 3 [IU] via SUBCUTANEOUS
  Administered 2016-05-10: 5 [IU] via SUBCUTANEOUS
  Administered 2016-05-11: 2 [IU] via SUBCUTANEOUS
  Administered 2016-05-11 – 2016-05-13 (×6): 3 [IU] via SUBCUTANEOUS
  Administered 2016-05-13 – 2016-05-14 (×2): 2 [IU] via SUBCUTANEOUS
  Administered 2016-05-14: 3 [IU] via SUBCUTANEOUS
  Administered 2016-05-15: 2 [IU] via SUBCUTANEOUS

## 2016-05-09 NOTE — Progress Notes (Addendum)
[facility] TEAM 1 - Stepdown/ICU TEAM PROGRESS NOTE  Angela Hays [id number] DOB: [birth year] DOA: 05/06/2016 PCP: Vena Austria, MD  Admit HPI / Brief Narrative: 65yo with history of hypertension, diabetes, hyperlipidemia, and CVA who was brought to the ED with malaise, decreased appetite, pain, and edema in both lower extremities. Patient has chronic lower extremity venous ulcers and an admission in January 2017 for sepsis from lower extremity cellulitis.  In ED she was found to be tachycardic (110s), hypothermic (T 90.3) and hypotensive despite a 30cc/kg fluid bolus with acute kidney injury with K of 6. CT of the head noted a hyperdense focus over left frontal region ? SAH.   Significant Events: 5/12 admit to ICU by PCCM  HPI/Subjective: The patient is alert and conversant.  She is very hungry and is tolerating a regular diet at present without any difficulty.  She denies chest pain shortness of breath abdominal pain nausea or vomiting.  Assessment/Plan:  Septic shock due to B LE cellulitis - Multiple full thickness wounds spread across anterior calves + L foot wound CT neg nec fas - narrow abx once culture data available   Gram- rod bacteremia 1 of 2 blood cultures - suspect cellulitis as source - await sensitivities   Acute delirium - ? SAH on CT Likely related to sepsis/ metabolic issues - repeat CT head in AM to f/u   AKI Pre renal vs ATN - crt steadily improving   Normocytic anemia  Hgb decline most c/w dilution from volume resuscitation - no evidence of acute blood loss - follow  Hyperkalemia - resolved  EKG with no hyperacute changes   Hypokalemia Replace gently and follow   AG metabolic acidosis Resolved  Hypernatremia Improving - due to true [facility]   DM  CBG currently controlled   Hx Rt CVA  MRSA screen +  Code Status: FULL Family Communication: no family present at time of exam Disposition Plan:  SDU  Consultants: PCCM  Antibiotics: Vanco 5/12 > Zosyn 5/12 > Clindamycin 5/12 > 5/14  DVT prophylaxis: SQ heparin (hold until f/u CT head accomplished)  Objective: Blood pressure 128/60, pulse 91, temperature 98.9 F (37.2 C), temperature source Oral, resp. rate 26, height 5\' 3"  (1.6 m), weight 67.586 kg (149 lb), SpO2 95 %.  Intake/Output Summary (Last 24 hours) at 05/09/16 1559 Last data filed at 05/09/16 1512  Gross per 24 hour  Intake 1809.83 ml  Output   1400 ml  Net 409.83 ml   Exam: General: No acute respiratory distress Lungs: Clear to auscultation bilaterally without wheezes or crackles Cardiovascular: Regular rate and rhythm without murmur gallop or rub normal S1 and S2 Abdomen: Nontender, nondistended, soft, bowel sounds positive, no rebound, no ascites, no appreciable mass Extremities: B LE dressed and dry at present - both legs appear contracted in a flexed position   Data Reviewed: Basic Metabolic Panel:  Recent Labs Lab 05/06/16 2300 05/07/16 0310 05/08/16 0426 05/08/16 1744 05/09/16 0343  NA 140 142 156* 158* 154*  K 5.5* 4.9 3.4* 3.3* 3.1*  CL 116* 121* 121* 120* 121*  CO2 7* 8* 21* 26 28  GLUCOSE 178* 92 104* 111* 128*  BUN 165* 154* 88* 57* 45*  CREATININE 3.28* 2.82* 1.61* 1.35* 1.27*  CALCIUM 7.4* 6.9* 7.7* 7.6* 7.5*  MG 2.0 1.7 1.6*  --  2.1  PHOS 8.4* 7.1* 3.4  --  2.6    CBC:  Recent Labs Lab 05/06/16 1744 05/06/16 2300 05/07/16 0310 05/09/16 0343  WBC  11.9* 13.8* 12.7* 8.3  NEUTROABS 10.8* 12.7*  --   --   HGB 10.8* 10.8* 10.4* 9.0*  HCT 33.9* 33.6* 30.9* 27.5*  MCV 85.0 85.1 84.7 83.8  PLT 283 296 256 197    Liver Function Tests:  Recent Labs Lab 05/06/16 1744 05/06/16 2300  AST 36 41  ALT 35 36  ALKPHOS 49 49  BILITOT 0.6 0.9  PROT 5.4* 4.9*  ALBUMIN 2.5* 2.2*    Coags:  Recent Labs Lab 05/06/16 2300  INR 1.42    Recent Labs Lab 05/06/16 2300  APTT 39*    Cardiac Enzymes:  Recent Labs Lab  05/06/16 2300 05/07/16 0310  CKTOTAL 998*  --   TROPONINI 0.03 0.05*    CBG:  Recent Labs Lab 05/08/16 1940 05/08/16 2348 05/09/16 0346 05/09/16 0801 05/09/16 1218  GLUCAP 130* 100* 129* 107* 140*    Recent Results (from the past 240 hour(s))  Blood Culture (routine x 2)     Status: None (Preliminary result)   Collection Time: 05/06/16  5:25 PM  Result Value Ref Range Status   Specimen Description BLOOD RIGHT HAND  Final   Special Requests IN PEDIATRIC BOTTLE 2CC  Final   Culture NO GROWTH 1 DAY  Final   Report Status PENDING  Incomplete  Blood Culture (routine x 2)     Status: None (Preliminary result)   Collection Time: 05/06/16  5:30 PM  Result Value Ref Range Status   Specimen Description BLOOD RIGHT ARM  Final   Special Requests IN PEDIATRIC BOTTLE 2CC  Final   Culture  Setup Time   Final    PED GRAM NEGATIVE RODS CRITICAL RESULT CALLED TO, READ BACK BY AND VERIFIED WITH: TO SFLINT(RN) BY TCLEVELAND 05/11/2016 AT 11:39PM Organism ID to follow    Culture   Final    GRAM NEGATIVE RODS IDENTIFICATION AND SUSCEPTIBILITIES TO FOLLOW    Report Status PENDING  Incomplete  Urine culture     Status: Abnormal   Collection Time: 05/06/16  6:45 PM  Result Value Ref Range Status   Specimen Description URINE, CATHETERIZED  Final   Special Requests NONE  Final   Culture <10,000 COLONIES/mL INSIGNIFICANT GROWTH (A)  Final   Report Status 05/08/2016 FINAL  Final  MRSA PCR Screening     Status: Abnormal   Collection Time: 05/07/16 12:25 AM  Result Value Ref Range Status   MRSA by PCR POSITIVE (A) NEGATIVE Final    Comment:        The GeneXpert MRSA Assay (FDA approved for NASAL specimens only), is one component of a comprehensive MRSA colonization surveillance program. It is not intended to diagnose MRSA infection nor to guide or monitor treatment for MRSA infections. RESULT CALLED TO, READ BACK BY AND VERIFIED WITH:  TO BWILSON(RN) BY [facility] 05/07/2016 AT  4:11AM      Studies:   Recent x-ray studies have been reviewed in detail by the Attending Physician  Scheduled Meds:  Scheduled Meds: . Chlorhexidine Gluconate Cloth  6 each Topical Q0600  . clopidogrel  75 mg Oral Daily  . heparin subcutaneous  5,000 Units Subcutaneous Q8H  . insulin aspart  0-15 Units Subcutaneous Q4H  . mupirocin ointment  1 application Nasal BID  . piperacillin-tazobactam (ZOSYN)  IV  3.375 g Intravenous Q8H  . sodium chloride flush  10-40 mL Intracatheter Q12H  . vancomycin  1,000 mg Intravenous Q24H    Time spent on care of this patient: 35 mins   MCCLUNG,JEFFREY  T , MD   [facility] Office  [phone] Pager - Text Page per Amion as per below:  On-Call/Text Page:      [email or web address]      password [email or web address]  If 7PM-7AM, please contact night-coverage [email or web address] Password [email or web address] 05/09/2016, 3:59 PM   LOS: 3 days

## 2016-05-09 NOTE — Progress Notes (Signed)
On rounds- pt had some audible wheezes placed on 02 @ 2 L n/c. Text page to MD PRN resp Tx ordered for Albuterol. Unable to do any ROM on lower extremities. Pt's lower extremities very contracted screams in pain if you try to move her legs. Active ROM upper extremities only. Pt has bilat prevelon boots on over kerlex dressings for leg wounds  - repositioning side to side only does not tolerate her back due to leg contractures

## 2016-05-09 NOTE — Progress Notes (Signed)
Patient pending transfer from [facility]. Receiving RN is in CT with another one of her assigned patient's. [facility] sending RN Hoover Browns in patient placement and Crystal AC all  made aware of the reason for delay in transfer.

## 2016-05-09 NOTE — Consult Note (Addendum)
WOC wound consult note Reason for Consult: Consult requested for bilat legs.  Pt states her son takes care of her legs at home and they were improving but this week she developed cellulitis whichthis has evolved into full thickness wounds. This is very painful and pt is crying during dressing change. Wound type: Multiple full thickness wounds spread across anterior calves; affected areas approx 28X12, involving legs below knees to above ankles, also left foot 5X4X.1cm. Wound bed: All wounds are red and moist with loose peeling skin surrounding.  Mod amt yellow drainage, no odor.  Dressing procedure/placement/frequency: Xeroform to promote moist healing and decrease discomfort and adherence with dressing changes.  Discussed plan of care with patient and she verbalized understanding.  Prevalon heel lift boots to reduce pressure.  No family members present to discuss plan of care. Please re-consult if further assistance is needed.  Thank-you,  Julien Girt MSN, [place], [place], [place], [place]

## 2016-05-10 ENCOUNTER — Inpatient Hospital Stay (HOSPITAL_COMMUNITY): Payer: Medicare Other

## 2016-05-10 DIAGNOSIS — R7881 Bacteremia: Secondary | ICD-10-CM

## 2016-05-10 DIAGNOSIS — A4150 Gram-negative sepsis, unspecified: Secondary | ICD-10-CM | POA: Diagnosis not present

## 2016-05-10 LAB — GLUCOSE, CAPILLARY
GLUCOSE-CAPILLARY: 148 mg/dL — AB (ref 65–99)
Glucose-Capillary: 226 mg/dL — ABNORMAL HIGH (ref 65–99)
Glucose-Capillary: 163 mg/dL — ABNORMAL HIGH (ref 65–99)
Glucose-Capillary: 111 mg/dL — ABNORMAL HIGH (ref 65–99)

## 2016-05-10 LAB — CBC
HEMATOCRIT: 29.3 % — AB (ref 36.0–46.0)
HEMOGLOBIN: 9.2 g/dL — AB (ref 12.0–15.0)
MCH: 27.4 pg (ref 26.0–34.0)
MCHC: 31.4 g/dL (ref 30.0–36.0)
MCV: 87.2 fL (ref 78.0–100.0)
Platelets: 209 10*3/uL (ref 150–400)
RBC: 3.36 MIL/uL — AB (ref 3.87–5.11)
RDW: 16.7 % — ABNORMAL HIGH (ref 11.5–15.5)
WBC: 11.6 10*3/uL — ABNORMAL HIGH (ref 4.0–10.5)

## 2016-05-10 LAB — CULTURE, BLOOD (ROUTINE X 2)

## 2016-05-10 LAB — COMPREHENSIVE METABOLIC PANEL
ALBUMIN: 1.7 g/dL — AB (ref 3.5–5.0)
ALK PHOS: 44 U/L (ref 38–126)
ALT: 29 U/L (ref 14–54)
ANION GAP: 11 (ref 5–15)
AST: 20 U/L (ref 15–41)
BUN: 30 mg/dL — ABNORMAL HIGH (ref 6–20)
CALCIUM: 8 mg/dL — AB (ref 8.9–10.3)
CHLORIDE: 115 mmol/L — AB (ref 101–111)
CO2: 27 mmol/L (ref 22–32)
Creatinine, Ser: 1.26 mg/dL — ABNORMAL HIGH (ref 0.44–1.00)
GFR calc non Af Amer: 43 mL/min — ABNORMAL LOW (ref >60)
GFR, EST AFRICAN AMERICAN: 50 mL/min — AB (ref >60)
GLUCOSE: 284 mg/dL — AB (ref 65–99)
POTASSIUM: 3.7 mmol/L (ref 3.5–5.1)
SODIUM: 153 mmol/L — AB (ref 135–145)
Total Bilirubin: 0.6 mg/dL (ref 0.3–1.2)
Total Protein: 4.7 g/dL — ABNORMAL LOW (ref 6.5–8.1)

## 2016-05-10 MED ORDER — HEPARIN SODIUM (PORCINE) 5000 UNIT/ML IJ SOLN
5000.0000 [IU] | Freq: Three times a day (TID) | INTRAMUSCULAR | Status: DC
  Administered 2016-05-10 – 2016-05-15 (×14): 5000 [IU] via SUBCUTANEOUS
  Filled 2016-05-10 (×14): qty 1

## 2016-05-10 MED ORDER — CIPROFLOXACIN IN D5W 400 MG/200ML IV SOLN
400.0000 mg | Freq: Two times a day (BID) | INTRAVENOUS | Status: DC
  Administered 2016-05-10 – 2016-05-11 (×2): 400 mg via INTRAVENOUS
  Filled 2016-05-10 (×2): qty 200

## 2016-05-10 NOTE — Progress Notes (Signed)
Patient arrived to [facility] from [facility]. Alert and oriented. Report received from [place], [place].

## 2016-05-10 NOTE — Progress Notes (Signed)
[facility] TEAM 1 - Stepdown/ICU TEAM PROGRESS NOTE  Angela Hays [id number] DOB: October 19, 1951 DOA: 05/06/2016 PCP: Vena Austria, MD  Admit HPI / Brief Narrative: 65yo with history of hypertension, diabetes, hyperlipidemia, and CVA who was brought to the ED with malaise, decreased appetite, pain, and edema in both lower extremities. Patient has chronic lower extremity venous ulcers and an admission in January 2017 for sepsis from lower extremity cellulitis.  In ED she was found to be tachycardic (110s), hypothermic (T 90.3) and hypotensive despite a 30cc/kg fluid bolus with acute kidney injury with K of 6. CT of the head noted a hyperdense focus over left frontal region ? SAH.   Significant Events: 5/12 admit to ICU by PCCM  HPI/Subjective: The patient has no new complaints today.  She is resting comfortably in bed.    Assessment/Plan:  Septic shock due to B LE cellulitis - Multiple full thickness wounds spread across anterior calves + L foot wound CT neg nec fas - culture + for Alcaligenes species which is sensitive to cipro - narrow abx today and follow    Gram- rod bacteremia - Alcaligenes species  1 of 2 blood cultures - suspect cellulitis as source - narrow to cipro and follow   Acute delirium SAH ruled out on f/u CT head - likely related to sepsis/ metabolic issues - mental status improving   AKI Pre renal vs ATN - crt steadily improving   Recent Labs Lab 05/06/16 2300 05/07/16 0310 05/08/16 0426 05/08/16 1744 05/09/16 0343 05/10/16 0644  CREATININE 3.28* 2.82* 1.61* 1.35* 1.27* 1.26*    Normocytic anemia  Hgb decline most c/w dilution from volume resuscitation - no evidence of acute blood loss - Hgb now stabilizing   Recent Labs Lab 05/06/16 1744 05/06/16 2300 05/07/16 0310 05/09/16 0343 05/10/16 0644  HGB 10.8* 10.8* 10.4* 9.0* 9.2*    Hyperkalemia - resolved  EKG with no hyperacute changes   Hypokalemia Replaced gently  AG metabolic  acidosis Resolved  Hypernatremia Improving - due to true [facility]   DM  CBG reasonably controlled   Hx Rt CVA  MRSA screen +  Code Status: FULL Family Communication: no family present at time of exam Disposition Plan: transfer to med bed   Consultants: PCCM  Antibiotics: Cipro 5/16 > Vanco 5/12 > 5/16 Zosyn 5/12 > 5/16 Clindamycin 5/12 > 5/14  DVT prophylaxis: SQ heparin   Objective: Blood pressure 133/59, pulse 94, temperature 99.3 F (37.4 C), temperature source Oral, resp. rate 26, height 5\' 3"  (1.6 m), weight 68.4 kg (150 lb 12.7 oz), SpO2 97 %.  Intake/Output Summary (Last 24 hours) at 05/10/16 1631 Last data filed at 05/10/16 0558  Gross per 24 hour  Intake    100 ml  Output      0 ml  Net    100 ml   Exam: General: No acute respiratory distress  Lungs: Clear to auscultation bilaterally - no wheezes or crackles Cardiovascular: Regular rate and rhythm without murmur gallop or rub Abdomen: Nondistended, soft, bowel sounds positive, no rebound, no ascites, no appreciable mass Extremities: B LE dressed and dry at present - both legs contracted in a flexed position   Data Reviewed: Basic Metabolic Panel:  Recent Labs Lab 05/06/16 2300 05/07/16 0310 05/08/16 0426 05/08/16 1744 05/09/16 0343 05/10/16 0644  NA 140 142 156* 158* 154* 153*  K 5.5* 4.9 3.4* 3.3* 3.1* 3.7  CL 116* 121* 121* 120* 121* 115*  CO2 7* 8* 21* 26 28 27  GLUCOSE 178* 92 104* 111* 128* 284*  BUN 165* 154* 88* 57* 45* 30*  CREATININE 3.28* 2.82* 1.61* 1.35* 1.27* 1.26*  CALCIUM 7.4* 6.9* 7.7* 7.6* 7.5* 8.0*  MG 2.0 1.7 1.6*  --  2.1  --   PHOS 8.4* 7.1* 3.4  --  2.6  --     CBC:  Recent Labs Lab 05/06/16 1744 05/06/16 2300 05/07/16 0310 05/09/16 0343 05/10/16 0644  WBC 11.9* 13.8* 12.7* 8.3 11.6*  NEUTROABS 10.8* 12.7*  --   --   --   HGB 10.8* 10.8* 10.4* 9.0* 9.2*  HCT 33.9* 33.6* 30.9* 27.5* 29.3*  MCV 85.0 85.1 84.7 83.8 87.2  PLT 283 296 256 197 209    Liver  Function Tests:  Recent Labs Lab 05/06/16 1744 05/06/16 2300 05/10/16 0644  AST 36 41 20  ALT 35 36 29  ALKPHOS 49 49 44  BILITOT 0.6 0.9 0.6  PROT 5.4* 4.9* 4.7*  ALBUMIN 2.5* 2.2* 1.7*    Coags:  Recent Labs Lab 05/06/16 2300  INR 1.42    Recent Labs Lab 05/06/16 2300  APTT 39*    Cardiac Enzymes:  Recent Labs Lab 05/06/16 2300 05/07/16 0310  CKTOTAL 998*  --   TROPONINI 0.03 0.05*    CBG:  Recent Labs Lab 05/09/16 1218 05/09/16 1658 05/09/16 2003 05/10/16 0827 05/10/16 1214  GLUCAP 140* 235* 169* 226* 163*    Recent Results (from the past 240 hour(s))  Blood Culture (routine x 2)     Status: None (Preliminary result)   Collection Time: 05/06/16  5:25 PM  Result Value Ref Range Status   Specimen Description BLOOD RIGHT HAND  Final   Special Requests IN PEDIATRIC BOTTLE 2CC  Final   Culture NO GROWTH 3 DAYS  Final   Report Status PENDING  Incomplete  Blood Culture (routine x 2)     Status: Abnormal   Collection Time: 05/06/16  5:30 PM  Result Value Ref Range Status   Specimen Description BLOOD RIGHT ARM  Final   Special Requests IN PEDIATRIC BOTTLE 2CC  Final   Culture  Setup Time   Final    PED GRAM NEGATIVE RODS CRITICAL RESULT CALLED TO, READ BACK BY AND VERIFIED WITH: TO SFLINT(RN) BY TCLEVELAND 05/07/2016 AT 11:39PM Organism ID to follow    Culture ALCALIGENES SPECIES (A)  Final   Report Status 05/10/2016 FINAL  Final   Organism ID, Bacteria ALCALIGENES SPECIES  Final      Susceptibility   Alcaligenes species - MIC*    CEFAZOLIN 32 INTERMEDIATE Intermediate     GENTAMICIN 4 SENSITIVE Sensitive     CIPROFLOXACIN 1 SENSITIVE Sensitive     IMIPENEM 0.5 SENSITIVE Sensitive     TRIMETH/SULFA <=20 SENSITIVE Sensitive     PIP/TAZO Value in next row Sensitive      SENSITIVE<=4    * ALCALIGENES SPECIES  Urine culture     Status: Abnormal   Collection Time: 05/06/16  6:45 PM  Result Value Ref Range Status   Specimen Description URINE,  CATHETERIZED  Final   Special Requests NONE  Final   Culture <10,000 COLONIES/mL INSIGNIFICANT GROWTH (A)  Final   Report Status 05/08/2016 FINAL  Final  MRSA PCR Screening     Status: Abnormal   Collection Time: 05/07/16 12:25 AM  Result Value Ref Range Status   MRSA by PCR POSITIVE (A) NEGATIVE Final    Comment:        The GeneXpert MRSA Assay (FDA approved  for NASAL specimens only), is one component of a comprehensive MRSA colonization surveillance program. It is not intended to diagnose MRSA infection nor to guide or monitor treatment for MRSA infections. RESULT CALLED TO, READ BACK BY AND VERIFIED WITH:  TO BWILSON(RN) BY [facility] 05/07/2016 AT 4:11AM      Studies:   Recent x-ray studies have been reviewed in detail by the Attending Physician  Scheduled Meds:  Scheduled Meds: . Chlorhexidine Gluconate Cloth  6 each Topical Q0600  . insulin aspart  0-15 Units Subcutaneous TID WC  . insulin aspart  0-5 Units Subcutaneous QHS  . mupirocin ointment  1 application Nasal BID  . piperacillin-tazobactam (ZOSYN)  IV  3.375 g Intravenous Q8H  . vancomycin  1,000 mg Intravenous Q24H    Time spent on care of this patient: 35 mins   Lafreda Casebeer T , MD   [facility] Office  [phone] Pager - Text Page per Shea Evans as per below:  On-Call/Text Page:      [email or web address]      password [email or web address]  If 7PM-7AM, please contact night-coverage [email or web address] Password [email or web address] 05/10/2016, 4:31 PM   LOS: 4 days

## 2016-05-10 NOTE — Care Management Important Message (Signed)
Important Message  Patient Details  Name: KEARIE PROPER MRN: [id number] Date of Birth: [birth year]   Medicare Important Message Given:  Yes    Marine Lezotte T, RN 05/10/2016, 11:01 AM

## 2016-05-11 DIAGNOSIS — L97909 Non-pressure chronic ulcer of unspecified part of unspecified lower leg with unspecified severity: Secondary | ICD-10-CM | POA: Diagnosis present

## 2016-05-11 DIAGNOSIS — N179 Acute kidney failure, unspecified: Secondary | ICD-10-CM | POA: Diagnosis present

## 2016-05-11 LAB — BASIC METABOLIC PANEL
Anion gap: 12 (ref 5–15)
BUN: 20 mg/dL (ref 6–20)
CHLORIDE: 113 mmol/L — AB (ref 101–111)
CO2: 25 mmol/L (ref 22–32)
Calcium: 8.1 mg/dL — ABNORMAL LOW (ref 8.9–10.3)
Creatinine, Ser: 1.08 mg/dL — ABNORMAL HIGH (ref 0.44–1.00)
GFR calc non Af Amer: 52 mL/min — ABNORMAL LOW (ref >60)
Glucose, Bld: 219 mg/dL — ABNORMAL HIGH (ref 65–99)
POTASSIUM: 4.1 mmol/L (ref 3.5–5.1)
SODIUM: 150 mmol/L — AB (ref 135–145)

## 2016-05-11 LAB — GLUCOSE, CAPILLARY
GLUCOSE-CAPILLARY: 167 mg/dL — AB (ref 65–99)
GLUCOSE-CAPILLARY: 210 mg/dL — AB (ref 65–99)
Glucose-Capillary: 153 mg/dL — ABNORMAL HIGH (ref 65–99)
Glucose-Capillary: 147 mg/dL — ABNORMAL HIGH (ref 65–99)

## 2016-05-11 LAB — CBC
HCT: 30.1 % — ABNORMAL LOW (ref 36.0–46.0)
Hemoglobin: 9.1 g/dL — ABNORMAL LOW (ref 12.0–15.0)
MCH: 27.1 pg (ref 26.0–34.0)
MCHC: 30.2 g/dL (ref 30.0–36.0)
MCV: 89.6 fL (ref 78.0–100.0)
PLATELETS: 231 10*3/uL (ref 150–400)
RBC: 3.36 MIL/uL — ABNORMAL LOW (ref 3.87–5.11)
RDW: 17.1 % — AB (ref 11.5–15.5)
WBC: 8.6 10*3/uL (ref 4.0–10.5)

## 2016-05-11 LAB — C DIFFICILE QUICK SCREEN W PCR REFLEX
C Diff antigen: NEGATIVE
C Diff interpretation: NEGATIVE
C Diff toxin: NEGATIVE

## 2016-05-11 MED ORDER — AMPICILLIN-SULBACTAM SODIUM 3 (2-1) G IJ SOLR
3.0000 g | Freq: Four times a day (QID) | INTRAMUSCULAR | Status: DC
  Administered 2016-05-11 – 2016-05-12 (×6): 3 g via INTRAVENOUS
  Filled 2016-05-11 (×9): qty 3

## 2016-05-11 MED ORDER — DEXTROSE 5 % IV SOLN
INTRAVENOUS | Status: DC
  Administered 2016-05-11: 08:00:00 via INTRAVENOUS

## 2016-05-11 MED ORDER — DOXAZOSIN MESYLATE 8 MG PO TABS
8.0000 mg | ORAL_TABLET | Freq: Every day | ORAL | Status: DC
  Administered 2016-05-11 – 2016-05-14 (×4): 8 mg via ORAL
  Filled 2016-05-11 (×4): qty 1

## 2016-05-11 MED ORDER — WHITE PETROLATUM GEL
Status: AC
  Administered 2016-05-11: 0.2
  Filled 2016-05-11: qty 1

## 2016-05-11 MED ORDER — LABETALOL HCL 100 MG PO TABS
200.0000 mg | ORAL_TABLET | Freq: Two times a day (BID) | ORAL | Status: DC
  Administered 2016-05-11 – 2016-05-14 (×7): 200 mg via ORAL
  Filled 2016-05-11 (×8): qty 2

## 2016-05-11 MED ORDER — SIMVASTATIN 20 MG PO TABS
20.0000 mg | ORAL_TABLET | Freq: Every day | ORAL | Status: DC
  Administered 2016-05-12 – 2016-05-15 (×4): 20 mg via ORAL
  Filled 2016-05-11 (×4): qty 1

## 2016-05-11 MED ORDER — FUROSEMIDE 80 MG PO TABS: 80.0000 mg | ORAL_TABLET | Freq: Every day | ORAL | Status: DC

## 2016-05-11 MED ORDER — CLOPIDOGREL BISULFATE 75 MG PO TABS
75.0000 mg | ORAL_TABLET | Freq: Every day | ORAL | Status: DC
  Administered 2016-05-11 – 2016-05-15 (×5): 75 mg via ORAL
  Filled 2016-05-11 (×5): qty 1

## 2016-05-11 NOTE — Care Management Note (Signed)
Case Management Note  Patient Details  Name: KAMIRAH HAEFELE MRN: [id number] Date of Birth: Jun 20, 1951  Subjective/Objective:                    Action/Plan:   Expected Discharge Date:                  Expected Discharge Plan:  [place]  In-House Referral:     Discharge planning Services     Post Acute Care Choice:    Choice offered to:     DME Arranged:    DME Agency:     HH Arranged:    [place] Agency:     Status of Service:  In process, will continue to follow  Medicare Important Message Given:  Yes Date Medicare IM Given:    Medicare IM give by:    Date Additional Medicare IM Given:    Additional Medicare Important Message give by:     If discussed at [place] of Stay Meetings, dates discussed:    Additional Comments:  Marilu Favre, RN 05/11/2016, 2:49 PM

## 2016-05-11 NOTE — Consult Note (Signed)
WOC consulted and evaluated patient 05/09/16 for bilateral leg wounds, new orders written.   Will not reconsult at this time since for same reason M. [address] [place]

## 2016-05-11 NOTE — Progress Notes (Signed)
[place] TEAM 1 - Stepdown/ICU TEAM PROGRESS NOTE  Angela Hays [id number] DOB: 1951/09/22 DOA: 05/06/2016 PCP: Vena Austria, MD  Admit HPI / Brief Narrative:  65yo with history of hypertension, diabetes, hyperlipidemia, and CVA who was brought to the ED with malaise, decreased appetite, pain, and edema in both lower extremities. Patient has chronic lower extremity venous ulcers and an admission in January 2017 for sepsis from lower extremity cellulitis.  In ED she was found to be tachycardic (110s), hypothermic (T 90.3) and hypotensive despite a 30cc/kg fluid bolus with acute kidney injury with K of 6. CT of the head noted a hyperdense focus over left frontal region ? SAH.   Significant Events: 5/12 admit to ICU by PCCM  HPI/Subjective:  In bed, mild diarrhea, denies any chest or abdominal pain, no shortness of breath. No focal weakness.  Assessment/Plan:  Septic shock due to B LE cellulitis - Multiple full thickness wounds spread across anterior calves + L foot wound with gram-negative bacteremia.  Culture and sensitivity noted, case discussed with ID physician on call Dr. Graylon Good, total 14 days of antibiotics based on sensitivity, can be switched to oral upon discharge.  Acute delirium SAH ruled out on f/u CT head - likely related to sepsis/ metabolic issues - mental status improving   AKI Pre renal vs ATN - crt steadily improving with IVF, Lasix and ARB on hold.  Normocytic anemia  Hgb decline most c/w dilution from volume resuscitation - no evidence of acute blood loss - Hgb now stabilizing   AG metabolic acidosis due to sepsis and acute renal failure. Resolved with hydration and IV antibiotics.  Hypernatremia Due to dehydration, gentle D5 drip. Reduce home dose Lasix upon discharge.   Hx Rt CVA - continue Plavix and statin for secondary prevention, PT eval. No acute issue.  DM 2  On ISS Lab Results  Component Value Date   HGBA1C 6.8* 01/05/2016    CBG (last 3)   Recent Labs  05/10/16 1645 05/10/16 2215 05/11/16 0814  GLUCAP 111* 148* 167*    Diarrhea. Rule out C. difficile due to antibiotic exposure. > 6 bowel movements today.   Code Status: FULL Family Communication: no family present at time of exam Disposition Plan: SNF vs HHPT  Consultants: PCCM, WC  Antibiotics: Cipro 5/16 > Vanco 5/12 > 5/16 Zosyn 5/12 > 5/16 Clindamycin 5/12 > 5/14  DVT prophylaxis: SQ heparin   Objective: Blood pressure 122/45, pulse 55, temperature 98.1 F (36.7 C), temperature source Oral, resp. rate 19, height 5\' 3"  (1.6 m), weight 69 kg (152 lb 1.9 oz), SpO2 100 %.  Intake/Output Summary (Last 24 hours) at 05/11/16 1149 Last data filed at 05/11/16 0946  Gross per 24 hour  Intake   1095 ml  Output      0 ml  Net   1095 ml   Exam: General: No acute respiratory distress  Lungs: Clear to auscultation bilaterally - no wheezes or crackles Cardiovascular: Regular rate and rhythm without murmur gallop or rub Abdomen: Nondistended, soft, bowel sounds positive, no rebound, no ascites, no appreciable mass Extremities: B LE dressed and dry at present - both legs contracted in a flexed position   Data Reviewed: Basic Metabolic Panel:  Recent Labs Lab 05/06/16 2300 05/07/16 0310 05/08/16 0426 05/08/16 1744 05/09/16 0343 05/10/16 0644 05/11/16 0508  NA 140 142 156* 158* 154* 153* 150*  K 5.5* 4.9 3.4* 3.3* 3.1* 3.7 4.1  CL 116* 121* 121* 120* 121* 115* 113*  CO2 7* 8* 21* 26 28 27 25   GLUCOSE 178* 92 104* 111* 128* 284* 219*  BUN 165* 154* 88* 57* 45* 30* 20  CREATININE 3.28* 2.82* 1.61* 1.35* 1.27* 1.26* 1.08*  CALCIUM 7.4* 6.9* 7.7* 7.6* 7.5* 8.0* 8.1*  MG 2.0 1.7 1.6*  --  2.1  --   --   PHOS 8.4* 7.1* 3.4  --  2.6  --   --     CBC:  Recent Labs Lab 05/06/16 1744 05/06/16 2300 05/07/16 0310 05/09/16 0343 05/10/16 0644 05/11/16 0803  WBC 11.9* 13.8* 12.7* 8.3 11.6* 8.6  NEUTROABS 10.8* 12.7*  --   --   --    --   HGB 10.8* 10.8* 10.4* 9.0* 9.2* 9.1*  HCT 33.9* 33.6* 30.9* 27.5* 29.3* 30.1*  MCV 85.0 85.1 84.7 83.8 87.2 89.6  PLT 283 296 256 197 209 231    Liver Function Tests:  Recent Labs Lab 05/06/16 1744 05/06/16 2300 05/10/16 0644  AST 36 41 20  ALT 35 36 29  ALKPHOS 49 49 44  BILITOT 0.6 0.9 0.6  PROT 5.4* 4.9* 4.7*  ALBUMIN 2.5* 2.2* 1.7*    Coags:  Recent Labs Lab 05/06/16 2300  INR 1.42    Recent Labs Lab 05/06/16 2300  APTT 39*    Cardiac Enzymes:  Recent Labs Lab 05/06/16 2300 05/07/16 0310  CKTOTAL 998*  --   TROPONINI 0.03 0.05*    CBG:  Recent Labs Lab 05/10/16 0827 05/10/16 1214 05/10/16 1645 05/10/16 2215 05/11/16 0814  GLUCAP 226* 163* 111* 148* 167*    Recent Results (from the past 240 hour(s))  Blood Culture (routine x 2)     Status: None (Preliminary result)   Collection Time: 05/06/16  5:25 PM  Result Value Ref Range Status   Specimen Description BLOOD RIGHT HAND  Final   Special Requests IN PEDIATRIC BOTTLE 2CC  Final   Culture NO GROWTH 3 DAYS  Final   Report Status PENDING  Incomplete  Blood Culture (routine x 2)     Status: Abnormal   Collection Time: 05/06/16  5:30 PM  Result Value Ref Range Status   Specimen Description BLOOD RIGHT ARM  Final   Special Requests IN PEDIATRIC BOTTLE 2CC  Final   Culture  Setup Time   Final    PED GRAM NEGATIVE RODS CRITICAL RESULT CALLED TO, READ BACK BY AND VERIFIED WITH: TO SFLINT(RN) BY TCLEVELAND 05/07/2016 AT 11:39PM Organism ID to follow    Culture ALCALIGENES SPECIES (A)  Final   Report Status 05/10/2016 FINAL  Final   Organism ID, Bacteria ALCALIGENES SPECIES  Final      Susceptibility   Alcaligenes species - MIC*    CEFAZOLIN 32 INTERMEDIATE Intermediate     GENTAMICIN 4 SENSITIVE Sensitive     CIPROFLOXACIN 1 SENSITIVE Sensitive     IMIPENEM 0.5 SENSITIVE Sensitive     TRIMETH/SULFA <=20 SENSITIVE Sensitive     PIP/TAZO Value in next row Sensitive      SENSITIVE<=4     * ALCALIGENES SPECIES  Urine culture     Status: Abnormal   Collection Time: 05/06/16  6:45 PM  Result Value Ref Range Status   Specimen Description URINE, CATHETERIZED  Final   Special Requests NONE  Final   Culture <10,000 COLONIES/mL INSIGNIFICANT GROWTH (A)  Final   Report Status 05/08/2016 FINAL  Final  MRSA PCR Screening     Status: Abnormal   Collection Time: 05/07/16 12:25 AM  Result Value  Ref Range Status   MRSA by PCR POSITIVE (A) NEGATIVE Final    Comment:        The GeneXpert MRSA Assay (FDA approved for NASAL specimens only), is one component of a comprehensive MRSA colonization surveillance program. It is not intended to diagnose MRSA infection nor to guide or monitor treatment for MRSA infections. RESULT CALLED TO, READ BACK BY AND VERIFIED WITH:  TO BWILSON(RN) BY TCLEVELAND 05/07/2016 AT 4:11AM   C difficile quick scan w PCR reflex     Status: None   Collection Time: 05/11/16 11:07 AM  Result Value Ref Range Status   C Diff antigen NEGATIVE NEGATIVE Final   C Diff toxin NEGATIVE NEGATIVE Final   C Diff interpretation Negative for toxigenic C. difficile  Final     Studies:   Recent x-ray studies have been reviewed in detail by the Attending Physician  Scheduled Meds:  Scheduled Meds: . ampicillin-sulbactam (UNASYN) IV  3 g Intravenous Q6H  . heparin subcutaneous  5,000 Units Subcutaneous Q8H  . insulin aspart  0-15 Units Subcutaneous TID WC  . insulin aspart  0-5 Units Subcutaneous QHS  . mupirocin ointment  1 application Nasal BID    Time spent on care of this patient: 35 mins  Signature  Thurnell Lose M.D on 05/11/2016 at 11:49 AM  Between 7am to 7pm - Pager - [phone], After 7pm go to [email or web address] - password [email or web address]  [phone]   LOS: 5 days

## 2016-05-12 LAB — BASIC METABOLIC PANEL
ANION GAP: 9 (ref 5–15)
BUN: 9 mg/dL (ref 6–20)
CHLORIDE: 107 mmol/L (ref 101–111)
CO2: 25 mmol/L (ref 22–32)
Calcium: 7.8 mg/dL — ABNORMAL LOW (ref 8.9–10.3)
Creatinine, Ser: 0.8 mg/dL (ref 0.44–1.00)
GFR calc Af Amer: >60 mL/min (ref >60)
GFR calc non Af Amer: >60 mL/min (ref >60)
GLUCOSE: 162 mg/dL — AB (ref 65–99)
POTASSIUM: 3.5 mmol/L (ref 3.5–5.1)
Sodium: 141 mmol/L (ref 135–145)

## 2016-05-12 LAB — GLUCOSE, CAPILLARY
Glucose-Capillary: 172 mg/dL — ABNORMAL HIGH (ref 65–99)
Glucose-Capillary: 173 mg/dL — ABNORMAL HIGH (ref 65–99)
Glucose-Capillary: 166 mg/dL — ABNORMAL HIGH (ref 65–99)
Glucose-Capillary: 169 mg/dL — ABNORMAL HIGH (ref 65–99)

## 2016-05-12 LAB — CULTURE, BLOOD (ROUTINE X 2): CULTURE: NO GROWTH

## 2016-05-12 LAB — CBC
HEMATOCRIT: 29.7 % — AB (ref 36.0–46.0)
HEMOGLOBIN: 9.3 g/dL — AB (ref 12.0–15.0)
MCH: 27.4 pg (ref 26.0–34.0)
MCHC: 31.3 g/dL (ref 30.0–36.0)
MCV: 87.6 fL (ref 78.0–100.0)
Platelets: 217 10*3/uL (ref 150–400)
RBC: 3.39 MIL/uL — ABNORMAL LOW (ref 3.87–5.11)
RDW: 16.5 % — AB (ref 11.5–15.5)
WBC: 7.7 10*3/uL (ref 4.0–10.5)

## 2016-05-12 MED ORDER — CIPROFLOXACIN HCL 500 MG PO TABS
500.0000 mg | ORAL_TABLET | Freq: Two times a day (BID) | ORAL | Status: DC
  Administered 2016-05-12 – 2016-05-15 (×6): 500 mg via ORAL
  Filled 2016-05-12 (×6): qty 1

## 2016-05-12 NOTE — Progress Notes (Signed)
PROGRESS NOTE  Angela Hays [id number] DOB: 22-Mar-1951 DOA: 05/06/2016 PCP: Vena Austria, MD  Admit HPI / Brief Narrative: 65yo with history of hypertension, diabetes, hyperlipidemia, and CVA who was brought to the ED with malaise, decreased appetite, pain, and edema in both lower extremities. Patient has chronic lower extremity venous ulcers and an admission in January 2017 for sepsis from lower extremity cellulitis.  In ED she was found to be tachycardic (110s), hypothermic (T 90.3) and hypotensive despite a 30cc/kg fluid bolus with acute kidney injury with K of 6. CT of the head noted a hyperdense focus over left frontal region ? SAH.   Significant Events: 5/12 admit to ICU by PCCM  HPI/Subjective:  In bed, denies any chest or abdominal pain, no shortness of breath. No focal weakness.  Assessment/Plan:  Septic shock due to B LE cellulitis - Multiple full thickness wounds spread across anterior calves + L foot wound with gram-negative bacteremia.  Culture and sensitivity noted, case discussed with ID physician on call Dr. Graylon Good, total 14 days of antibiotics based on sensitivity, can be switched to oral upon discharge.Last day of ABX May 25th, 2017.   Acute delirium SAH ruled out on f/u CT head - likely related to sepsis/ metabolic issues - mental status improving   AKI Pre renal vs ATN - crt steadily improving with IVF, Lasix and ARB on hold. Cr is now WNL.   Normocytic anemia  Hgb decline most c/w dilution from volume resuscitation - no evidence of acute blood loss - Hgb now stabilizing   AG metabolic acidosis due to sepsis and acute renal failure. Resolved with hydration and IV antibiotics. BMP in AM  Hypernatremia Due to dehydration, resolved with IVF. BMP in AM.   Hx Rt CVA - continue Plavix and statin for secondary prevention, PT eval. HH PT recommended   DM 2  On ISS Lab Results  Component Value Date   HGBA1C 6.8* 01/05/2016   CBG (last 3)    Recent Labs  05/12/16 0748 05/12/16 1256 05/12/16 1739  GLUCAP 172* 173* 166*    Diarrhea. C. Diff negative    Code Status: FULL Family Communication: no family present at time of exam Disposition Plan: Home in AM if blood work and VS stable.   Consultants: PCCM, WC  Antibiotics: Cipro 5/16 > 5/25 Vanco 5/12 > 5/16 Zosyn 5/12 > 5/16 Clindamycin 5/12 > 5/14  DVT prophylaxis: SQ heparin   Objective: Blood pressure 124/56, pulse 72, temperature 98.1 F (36.7 C), temperature source Oral, resp. rate 18, height 5\' 3"  (1.6 m), weight 68.53 kg (151 lb 1.3 oz), SpO2 98 %.  Intake/Output Summary (Last 24 hours) at 05/12/16 1922 Last data filed at 05/12/16 1811  Gross per 24 hour  Intake 2018.75 ml  Output    380 ml  Net 1638.75 ml   Exam: General: No acute respiratory distress  Lungs: Clear to auscultation bilaterally - no wheezes or crackles Cardiovascular: Regular rate and rhythm without murmur gallop or rub Abdomen: Nondistended, soft, bowel sounds positive, no rebound, no ascites, no appreciable mass Extremities: B LE dressed and dry at present - both legs contracted in a flexed position   Data Reviewed: Basic Metabolic Panel:  Recent Labs Lab 05/06/16 2300 05/07/16 0310 05/08/16 0426 05/08/16 1744 05/09/16 0343 05/10/16 0644 05/11/16 0508 05/12/16 0443  NA 140 142 156* 158* 154* 153* 150* 141  K 5.5* 4.9 3.4* 3.3* 3.1* 3.7 4.1 3.5  CL 116* 121* 121* 120* 121* 115* 113*  107  CO2 7* 8* 21* 26 28 27 25 25   GLUCOSE 178* 92 104* 111* 128* 284* 219* 162*  BUN 165* 154* 88* 57* 45* 30* 20 9  CREATININE 3.28* 2.82* 1.61* 1.35* 1.27* 1.26* 1.08* 0.80  CALCIUM 7.4* 6.9* 7.7* 7.6* 7.5* 8.0* 8.1* 7.8*  MG 2.0 1.7 1.6*  --  2.1  --   --   --   PHOS 8.4* 7.1* 3.4  --  2.6  --   --   --     CBC:  Recent Labs Lab 05/06/16 1744 05/06/16 2300 05/07/16 0310 05/09/16 0343 05/10/16 0644 05/11/16 0803 05/12/16 0443  WBC 11.9* 13.8* 12.7* 8.3 11.6* 8.6 7.7   NEUTROABS 10.8* 12.7*  --   --   --   --   --   HGB 10.8* 10.8* 10.4* 9.0* 9.2* 9.1* 9.3*  HCT 33.9* 33.6* 30.9* 27.5* 29.3* 30.1* 29.7*  MCV 85.0 85.1 84.7 83.8 87.2 89.6 87.6  PLT 283 296 256 197 209 231 217    Liver Function Tests:  Recent Labs Lab 05/06/16 1744 05/06/16 2300 05/10/16 0644  AST 36 41 20  ALT 35 36 29  ALKPHOS 49 49 44  BILITOT 0.6 0.9 0.6  PROT 5.4* 4.9* 4.7*  ALBUMIN 2.5* 2.2* 1.7*    Coags:  Recent Labs Lab 05/06/16 2300  INR 1.42    Recent Labs Lab 05/06/16 2300  APTT 39*    Cardiac Enzymes:  Recent Labs Lab 05/06/16 2300 05/07/16 0310  CKTOTAL 998*  --   TROPONINI 0.03 0.05*    CBG:  Recent Labs Lab 05/11/16 1631 05/11/16 2154 05/12/16 0748 05/12/16 1256 05/12/16 1739  GLUCAP 147* 210* 172* 173* 166*    Recent Results (from the past 240 hour(s))  Blood Culture (routine x 2)     Status: None   Collection Time: 05/06/16  5:25 PM  Result Value Ref Range Status   Specimen Description BLOOD RIGHT HAND  Final   Special Requests IN PEDIATRIC BOTTLE 2CC  Final   Culture NO GROWTH 5 DAYS  Final   Report Status 05/12/2016 FINAL  Final  Blood Culture (routine x 2)     Status: Abnormal   Collection Time: 05/06/16  5:30 PM  Result Value Ref Range Status   Specimen Description BLOOD RIGHT ARM  Final   Special Requests IN PEDIATRIC BOTTLE 2CC  Final   Culture  Setup Time   Final    PED GRAM NEGATIVE RODS CRITICAL RESULT CALLED TO, READ BACK BY AND VERIFIED WITH: TO SFLINT(RN) BY TCLEVELAND 05/07/2016 AT 11:39PM Organism ID to follow    Culture ALCALIGENES SPECIES (A)  Final   Report Status 05/10/2016 FINAL  Final   Organism ID, Bacteria ALCALIGENES SPECIES  Final      Susceptibility   Alcaligenes species - MIC*    CEFAZOLIN 32 INTERMEDIATE Intermediate     GENTAMICIN 4 SENSITIVE Sensitive     CIPROFLOXACIN 1 SENSITIVE Sensitive     IMIPENEM 0.5 SENSITIVE Sensitive     TRIMETH/SULFA <=20 SENSITIVE Sensitive     PIP/TAZO  Value in next row Sensitive      SENSITIVE<=4    * ALCALIGENES SPECIES  Urine culture     Status: Abnormal   Collection Time: 05/06/16  6:45 PM  Result Value Ref Range Status   Specimen Description URINE, CATHETERIZED  Final   Special Requests NONE  Final   Culture <10,000 COLONIES/mL INSIGNIFICANT GROWTH (A)  Final   Report Status 05/08/2016 FINAL  Final  MRSA PCR Screening     Status: Abnormal   Collection Time: 05/07/16 12:25 AM  Result Value Ref Range Status   MRSA by PCR POSITIVE (A) NEGATIVE Final  C difficile quick scan w PCR reflex     Status: None   Collection Time: 05/11/16 11:07 AM  Result Value Ref Range Status   C Diff antigen NEGATIVE NEGATIVE Final   C Diff toxin NEGATIVE NEGATIVE Final   C Diff interpretation Negative for toxigenic C. difficile  Final     Studies:   Recent x-ray studies have been reviewed in detail by the Attending Physician  Scheduled Meds:  Scheduled Meds: . ampicillin-sulbactam (UNASYN) IV  3 g Intravenous Q6H  . clopidogrel  75 mg Oral Daily  . doxazosin  8 mg Oral QHS  . heparin subcutaneous  5,000 Units Subcutaneous Q8H  . insulin aspart  0-15 Units Subcutaneous TID WC  . insulin aspart  0-5 Units Subcutaneous QHS  . labetalol  200 mg Oral BID  . simvastatin  20 mg Oral Q breakfast    Time spent on care of this patient: 35 mins  Signature  Faye Ramsay M.D on 05/12/2016 at 7:22 PM  Between 7am to 7pm - Pager - [phone], After 7pm go to [email or web address] - password [place]  [phone]   LOS: 6 days

## 2016-05-12 NOTE — Evaluation (Signed)
Physical Therapy Evaluation Patient Details Name: Angela Hays MRN: [id number] DOB: 04-04-1951 Today's Date: 05/12/2016   History of Present Illness  65yo female with history of hypertension, diabetes, hyperlipidemia, and CVA who was brought to the ED with malaise, decreased appetite, pain, and edema in both lower extremities. Pt diagnosed with Septic shock due to B LE cellulitis.   Clinical Impression  Pt admitted with above diagnosis. Pt currently with functional limitations due to the deficits listed below (see PT Problem List). Pt is adamant that she return home from the hospital with her son's assistance. She reports that she was able to perform pivot transfers independently (to and from her w/c) PTA. Currently the patient is requiring mod assist with bed mobility and is unable to perform bed to chair transfers. No family available to confirm patient's reports of prior function or available assistance at home. Pt will need to progress with her ability to perform transfers for safe D/C to home. PT to follow and work in mobility. If the patient is unable to progress acutely, D/C recommendation may need to be changed to SNF for further rehabilitation.         Follow Up Recommendations Supervision/Assistance - 24 hour;Home health PT    Equipment Recommendations  None recommended by PT    Recommendations for Other Services       Precautions / Restrictions Precautions Precautions: Fall Restrictions Weight Bearing Restrictions: No      Mobility  Bed Mobility Overal bed mobility: Needs Assistance Bed Mobility: Supine to Sit;Sit to Supine     Supine to sit: Mod assist Sit to supine: Mod assist   General bed mobility comments: Mod assist with LEs and trunk with transfers.   Transfers Overall transfer level: Needs assistance Equipment used: Rolling walker (2 wheeled) Transfers: Sit to/from Stand Sit to Stand: Max assist         General transfer comment: Attempting  squat-pivot transfer to chair X3, pt unable to complete transfer safely at this time. Pt unable to assist with LEs to perform transfer.    Ambulation/Gait                Stairs            Wheelchair Mobility    Modified Rankin (Stroke Patients Only)       Balance Overall balance assessment: Needs assistance Sitting-balance support: No upper extremity supported;Single extremity supported Sitting balance-Leahy Scale: Fair Sitting balance - Comments: Pt initially needing physical assist but able to progress to sitting without physical or UE support.                                      Pertinent Vitals/Pain Pain Assessment: No/denies pain    Home Living Family/patient expects to be discharged to:: Private residence Living Arrangements: Children Available Help at Discharge: Family;Available 24 hours/day Type of Home: House Home Access: Level entry     Home Layout: One level Home Equipment: Walker - 2 wheels;Cane - single point;Bedside commode;Wheelchair - manual Additional Comments: Pt reports that she lives with her son.     Prior Function Level of Independence: Independent with assistive device(s)         Comments: Pt reports using w/c for mobility around the house but able to do her own squat-pivot transfers. Son did assist with getting into bed.      Hand Dominance  Extremity/Trunk Assessment   Upper Extremity Assessment: LUE deficits/detail       LUE Deficits / Details: gross weakness through Lt UE     RLE Deficits / Details: weakness and approximately 80 degree knee flexion contracture. LLE Deficits / Details: weakness and approximately 80 degree knee flexion contracture.     Communication   Communication: No difficulties  Cognition Arousal/Alertness: Awake/alert Behavior During Therapy: WFL for tasks assessed/performed Overall Cognitive Status: Within Functional Limits for tasks assessed                       General Comments      Exercises        Assessment/Plan    PT Assessment Patient needs continued PT services  PT Diagnosis Difficulty walking;Generalized weakness   PT Problem List Decreased strength;Decreased range of motion;Decreased activity tolerance;Decreased balance;Decreased mobility  PT Treatment Interventions DME instruction;Functional mobility training;Therapeutic activities;Therapeutic exercise;Balance training;Patient/family education;Wheelchair mobility training   PT Goals (Current goals can be found in the Care Plan section) Acute Rehab PT Goals Patient Stated Goal: go home PT Goal Formulation: With patient Time For Goal Achievement: 05/26/16 Potential to Achieve Goals: Fair    Frequency Min 3X/week   Barriers to discharge        Co-evaluation               End of Session Equipment Utilized During Treatment: Gait belt Activity Tolerance: Patient tolerated treatment well Patient left: in bed;with call bell/phone within reach Nurse Communication: Mobility status         Time: 1128-1205 PT Time Calculation (min) (ACUTE ONLY): 37 min   Charges:   PT Evaluation $PT Eval Moderate Complexity: 1 Procedure PT Treatments $Therapeutic Activity: 8-22 mins   PT G Codes:        Cassell Clement, PT, CSCS Pager [phone] Office [phone]  05/12/2016, 1:15 PM

## 2016-05-13 LAB — GLUCOSE, CAPILLARY
GLUCOSE-CAPILLARY: 114 mg/dL — AB (ref 65–99)
GLUCOSE-CAPILLARY: 129 mg/dL — AB (ref 65–99)
GLUCOSE-CAPILLARY: 153 mg/dL — AB (ref 65–99)
GLUCOSE-CAPILLARY: 144 mg/dL — AB (ref 65–99)

## 2016-05-13 LAB — BLOOD GAS, ARTERIAL
Acid-Base Excess: 1.2 mmol/L (ref 0.0–2.0)
BICARBONATE: 24.6 meq/L — AB (ref 20.0–24.0)
DRAWN BY: 312971
O2 SAT: 93.4 %
PATIENT TEMPERATURE: 99.8
PH ART: 7.458 — AB (ref 7.350–7.450)
TCO2: 25.6 mmol/L (ref 0–100)
pCO2 arterial: 35.5 mmHg (ref 35.0–45.0)
pO2, Arterial: 70.1 mmHg — ABNORMAL LOW (ref 80.0–100.0)

## 2016-05-13 LAB — BASIC METABOLIC PANEL
ANION GAP: 7 (ref 5–15)
BUN: 7 mg/dL (ref 6–20)
CALCIUM: 7.8 mg/dL — AB (ref 8.9–10.3)
CO2: 25 mmol/L (ref 22–32)
CREATININE: 0.8 mg/dL (ref 0.44–1.00)
Chloride: 107 mmol/L (ref 101–111)
GFR calc non Af Amer: >60 mL/min (ref >60)
Glucose, Bld: 144 mg/dL — ABNORMAL HIGH (ref 65–99)
Potassium: 3.5 mmol/L (ref 3.5–5.1)
SODIUM: 139 mmol/L (ref 135–145)

## 2016-05-13 LAB — AMMONIA: Ammonia: 40 umol/L — ABNORMAL HIGH (ref 9–35)

## 2016-05-13 LAB — CBC
HEMATOCRIT: 30 % — AB (ref 36.0–46.0)
HEMOGLOBIN: 9.2 g/dL — AB (ref 12.0–15.0)
MCH: 26.7 pg (ref 26.0–34.0)
MCHC: 30.7 g/dL (ref 30.0–36.0)
MCV: 87 fL (ref 78.0–100.0)
Platelets: 224 10*3/uL (ref 150–400)
RBC: 3.45 MIL/uL — ABNORMAL LOW (ref 3.87–5.11)
RDW: 16.3 % — AB (ref 11.5–15.5)
WBC: 9.3 10*3/uL (ref 4.0–10.5)

## 2016-05-13 NOTE — Clinical Social Work Note (Signed)
CSW received referral for SNF.  Case discussed with case manager, and plan is to discharge home with home health.  CSW to sign off please re-consult if social work needs arise.  Morty Ortwein R. Jonerik Sliker, MSW, LCSWA [phone]  

## 2016-05-13 NOTE — Progress Notes (Signed)
[facility]                                                                              Patient Demographics  Angela Hays, is a 65 y.o. female, DOB - September 23, 1951, [id number]  Admit date - 05/06/2016   Admitting Physician Marshell Garfinkel, MD  Outpatient Primary MD for the patient is Vena Austria, MD  Outpatient specialists:   LOS - 7  days    Chief Complaint  Patient presents with  . Code Sepsis       Brief summary   65yo with history of hypertension, diabetes, hyperlipidemia, and CVA who was brought to the ED with malaise, decreased appetite, pain, and edema in both lower extremities. Patient has chronic lower extremity venous ulcers and an admission in January 2017 for sepsis from lower extremity cellulitis. In ED she was found to be tachycardic (110s), hypothermic (T 90.3) and hypotensive despite a 30cc/kg fluid bolus with acute kidney injury with K of 6. CT of the head noted a hyperdense focus over left frontal region ? SAH.  Patient was admitted to ICU by critical care service, transferred to [facility] service on 5/15  Assessment & Plan     Septic shock due to B LE cellulitis - gram-negative bacteremia/Alcaligenes species  - Multiple full thickness wounds spread across anterior calves + L foot wound with gram-negative bacteremia. -Culture and sensitivity noted, Dr Candiss Norse discussed with ID physician on call Dr. Graylon Good recommended total 14 days of antibiotics based on sensitivity, can be switched to oral upon discharge. Last day of ABX May 25th, 2017.   Acute delirium- somnolent -  SAH ruled out on f/u CT head - likely related to sepsis/ metabolic issues  - Increased mobility, obtain ammonia level, ABG  AKI resolved -  Pre renal vs ATN versus due to sepsis  - Creatinine function has improved  - Lasix and ARB on hold.   Normocytic anemia  - H&H stable, no evidence of acute blood loss   AG metabolic acidosis due to sepsis and acute renal  failure. -  Resolved with hydration and IV antibiotics - Creatinine function stable  Hypernatremia - Improving with IV fluids, likely due to dehydration   Hx Rt CVA - continue Plavix and statin for secondary prevention -  PT eval-> HH PT recommended   Diabetes mellitus - CBGs currently stable   Diarrhea C. difficile negative  Code Status:  full CODE STATUS  DVT prophylaxis:   heparin    Family Communication: Discussed in detail with the patient, all imaging results, lab results explained to the patient    Disposition Plan: Hopefully home in a.m. if mental status is better, alert and oriented  Time Spent in minutes 20 minutes  Procedures:    Consultants:   Admitted by CCM  Antimicrobials: Cipro 5/16 > 5/25 Vanco 5/12 > 5/16 Zosyn 5/12 > 5/16  Clindamycin 5/12 > 5/1   Medications  Scheduled Meds: . ciprofloxacin  500 mg Oral BID  . clopidogrel  75 mg Oral Daily  . doxazosin  8 mg Oral QHS  . heparin subcutaneous  5,000 Units Subcutaneous Q8H  . insulin  aspart  0-15 Units Subcutaneous TID WC  . insulin aspart  0-5 Units Subcutaneous QHS  . labetalol  200 mg Oral BID  . simvastatin  20 mg Oral Q breakfast   Continuous Infusions:  PRN Meds:.albuterol, oxyCODONE-acetaminophen, sodium chloride flush   Antibiotics   Anti-infectives    Start     Dose/Rate Route Frequency Ordered Stop   05/12/16 2000  ciprofloxacin (CIPRO) tablet 500 mg     500 mg Oral 2 times daily 05/12/16 1932     05/11/16 0800  Ampicillin-Sulbactam (UNASYN) 3 g in sodium chloride 0.9 % 100 mL IVPB  Status:  Discontinued    Comments:  Unasyn 3 g IV q6h for CrCl > 30 mL/min   3 g 100 mL/hr over 60 Minutes Intravenous Every 6 hours 05/11/16 0744 05/12/16 1932   05/10/16 1800  ciprofloxacin (CIPRO) IVPB 400 mg  Status:  Discontinued    Comments:  Pharmacy may adjust dose as indicated   400 mg 200 mL/hr over 60 Minutes Intravenous Every 12 hours 05/10/16 1703 05/11/16 0736   05/08/16 2000   vancomycin (VANCOCIN) IVPB 1000 mg/200 mL premix  Status:  Discontinued     1,000 mg 200 mL/hr over 60 Minutes Intravenous Every 48 hours 05/06/16 2153 05/08/16 0953   05/08/16 1100  vancomycin (VANCOCIN) IVPB 1000 mg/200 mL premix  Status:  Discontinued     1,000 mg 200 mL/hr over 60 Minutes Intravenous Every 24 hours 05/08/16 0953 05/10/16 1703   05/08/16 1000  piperacillin-tazobactam (ZOSYN) IVPB 3.375 g  Status:  Discontinued     3.375 g 12.5 mL/hr over 240 Minutes Intravenous Every 8 hours 05/08/16 0953 05/10/16 1703   05/07/16 0200  piperacillin-tazobactam (ZOSYN) IVPB 2.25 g  Status:  Discontinued     2.25 g 100 mL/hr over 30 Minutes Intravenous Every 8 hours 05/06/16 2149 05/08/16 0953   05/06/16 2200  clindamycin (CLEOCIN) IVPB 900 mg  Status:  Discontinued     900 mg 100 mL/hr over 30 Minutes Intravenous Every 8 hours 05/06/16 2145 05/09/16 0909   05/06/16 1715  vancomycin (VANCOCIN) 1,500 mg in sodium chloride 0.9 % 500 mL IVPB     1,500 mg 250 mL/hr over 120 Minutes Intravenous  Once 05/06/16 1712 05/06/16 1940   05/06/16 1715  piperacillin-tazobactam (ZOSYN) IVPB 3.375 g     3.375 g 100 mL/hr over 30 Minutes Intravenous  Once 05/06/16 1712 05/06/16 1805        Subjective:   Hatsue Pecina was seen and examined today.  Somewhat somnolent, arousable. Difficult to obtain review of systems from the patient.   Objective:   Filed Vitals:   05/12/16 1312 05/12/16 2125 05/13/16 0357 05/13/16 0603  BP: 124/56 145/75  120/53  Pulse: 72 82  80  Temp: 98.1 F (36.7 C) 98.5 F (36.9 C)  99.6 F (37.6 C)  TempSrc: Oral Oral  Oral  Resp: 18 18  18   Height:      Weight:   71.2 kg (156 lb 15.5 oz)   SpO2: 98% 97%  100%    Intake/Output Summary (Last 24 hours) at 05/13/16 1350 Last data filed at 05/13/16 0957  Gross per 24 hour  Intake   1170 ml  Output    380 ml  Net    790 ml     Wt Readings from Last 3 Encounters:  05/13/16 71.2 kg (156 lb 15.5 oz)  01/07/16  71.079 kg (156 lb 11.2 oz)     Exam  General:  Somewhat somnolent, NAD, arousable  HEENT:  PERRLA, EOMI, Anicteric Sclera, mucous membranes moist.   Neck: Supple, no JVD, no masses  Cardiovascular: S1 S2 auscultated, no rubs, murmurs or gallops. Regular rate and rhythm.  Respiratory: Clear to auscultation bilaterally, no wheezing, rales or rhonchi  Gastrointestinal: Soft, nontender, nondistended, + bowel sounds  Ext: both lower extremities dressing intact, in flexed position  Neuro: did not follow commands  Skin: No rashesomnolent Data Reviewed:  I have personally reviewed following labs and imaging studies  Micro Results Recent Results (from the past 240 hour(s))  Blood Culture (routine x 2)     Status: None   Collection Time: 05/06/16  5:25 PM  Result Value Ref Range Status   Specimen Description BLOOD RIGHT HAND  Final   Special Requests IN PEDIATRIC BOTTLE 2CC  Final   Culture NO GROWTH 5 DAYS  Final   Report Status 05/12/2016 FINAL  Final  Blood Culture (routine x 2)     Status: Abnormal   Collection Time: 05/06/16  5:30 PM  Result Value Ref Range Status   Specimen Description BLOOD RIGHT ARM  Final   Special Requests IN PEDIATRIC BOTTLE 2CC  Final   Culture  Setup Time   Final    PED GRAM NEGATIVE RODS CRITICAL RESULT CALLED TO, READ BACK BY AND VERIFIED WITH: TO SFLINT(RN) BY TCLEVELAND 05/07/2016 AT 11:39PM Organism ID to follow    Culture ALCALIGENES SPECIES (A)  Final   Report Status 05/10/2016 FINAL  Final   Organism ID, Bacteria ALCALIGENES SPECIES  Final      Susceptibility   Alcaligenes species - MIC*    CEFAZOLIN 32 INTERMEDIATE Intermediate     GENTAMICIN 4 SENSITIVE Sensitive     CIPROFLOXACIN 1 SENSITIVE Sensitive     IMIPENEM 0.5 SENSITIVE Sensitive     TRIMETH/SULFA <=20 SENSITIVE Sensitive     PIP/TAZO Value in next row Sensitive      SENSITIVE<=4    * ALCALIGENES SPECIES  Urine culture     Status: Abnormal   Collection Time: 05/06/16  6:45  PM  Result Value Ref Range Status   Specimen Description URINE, CATHETERIZED  Final   Special Requests NONE  Final   Culture <10,000 COLONIES/mL INSIGNIFICANT GROWTH (A)  Final   Report Status 05/08/2016 FINAL  Final  MRSA PCR Screening     Status: Abnormal   Collection Time: 05/07/16 12:25 AM  Result Value Ref Range Status   MRSA by PCR POSITIVE (A) NEGATIVE Final    Comment:        The GeneXpert MRSA Assay (FDA approved for NASAL specimens only), is one component of a comprehensive MRSA colonization surveillance program. It is not intended to diagnose MRSA infection nor to guide or monitor treatment for MRSA infections. RESULT CALLED TO, READ BACK BY AND VERIFIED WITH:  TO BWILSON(RN) BY TCLEVELAND 05/07/2016 AT 4:11AM   C difficile quick scan w PCR reflex     Status: None   Collection Time: 05/11/16 11:07 AM  Result Value Ref Range Status   C Diff antigen NEGATIVE NEGATIVE Final   C Diff toxin NEGATIVE NEGATIVE Final   C Diff interpretation Negative for toxigenic C. difficile  Final    Radiology Reports Ct Head Wo Contrast  05/10/2016  CLINICAL DATA:  Follow-up subarachnoid hemorrhage. History of hypertension, diabetes and stroke. EXAM: CT HEAD WITHOUT CONTRAST TECHNIQUE: Contiguous axial images were obtained from the base of the skull through the vertex without intravenous contrast. COMPARISON:  CT  HEAD May 06, 2016 FINDINGS: INTRACRANIAL CONTENTS: Up on close revealed prior CT there was motion at the level of suspicious density and, this was artifact. There 2 contiguous dural base calcifications along the anterior falx. No intraparenchymal hemorrhage, mass effect, midline shift or acute large vascular territory infarcts. Old bilateral cerebellar infarcts. Old LEFT caudate and RIGHT thalamus infarcts. Patchy to confluent supratentorial white matter hypodensities. Ventricles and sulci are overall normal for patient's age. No abnormal extra-axial fluid collections. Basal cisterns  are patent. Mild calcific atherosclerosis of the carotid siphons. ORBITS: The included ocular globes and orbital contents are non-suspicious. SINUSES: Trace paranasal sinus mucosal thickening without air-fluid levels. SKULL/SOFT TISSUES: No skull fracture. No significant soft tissue swelling. Probable empty sella. Patient is edentulous. IMPRESSION: No acute intracranial process. LEFT frontal density on prior CT was artifact. Chronic changes including old LEFT caudate, RIGHT thalamus and bilateral cerebellar infarcts. Moderate to severe chronic small vessel ischemic disease. Electronically Signed   By: Elon Alas M.D.   On: 05/10/2016 05:10   Ct Head Wo Contrast  05/06/2016  CLINICAL DATA:  Increased slurred speech. Not eating. Stroke 2008 with bilateral deficits. EXAM: CT HEAD WITHOUT CONTRAST TECHNIQUE: Contiguous axial images were obtained from the base of the skull through the vertex without intravenous contrast. COMPARISON:  12/05/2007 FINDINGS: Ventricles and cisterns are within normal. There is mild chronic ischemic microvascular disease. Small old central periventricular and basal gangliar infarct. 1.5 cm hypodensity over the right cerebral hemisphere without mass effect and sub cm hypodensity over the left cerebral hemisphere likely old infarcts. No evidence of mass effect or shift of midline structures. No definite acute infarct. Calcification along the anterior interhemispheric fissure. There is an adjacent 3-4 mm hyperdense focus just left of midline over the left frontal region likely early calcification although cannot exclude a small focus of acute subarachnoid hemorrhage. Remaining bones and soft tissues are within normal. IMPRESSION: 3-4 mm hyperdense focus just left of midline over the frontal region which may be due to early calcification along the fissure, although cannot exclude a small focus of acute subarachnoid hemorrhage. Chronic ischemic microvascular disease and old small infarcts  as described. These results were called by telephone at the time of interpretation on 05/06/2016 at 6:32 pm to Dr. Tyrone Nine, who verbally acknowledged these results. Electronically Signed   By: Marin Olp M.D.   On: 05/06/2016 18:33   Dg Chest Port 1 View  05/07/2016  CLINICAL DATA:  65 year old female with history of acute respiratory failure. EXAM: PORTABLE CHEST 1 VIEW COMPARISON:  No priors. FINDINGS: Left-sided internal jugular central venous catheter with tip terminating in the distal superior vena cava. Low lung volumes. Interstitial prominence in the left lung. No confluent consolidative airspace disease. No pleural effusions. No evidence of pulmonary edema. Heart size is normal. Upper mediastinal contours are within normal limits. Atherosclerosis in the thoracic aorta. IMPRESSION: 1. Support apparatus, as above. 2. Interval development of interstitial prominence throughout the low left lung, concerning for potential developing bronchopneumonia. 3. Atherosclerosis. Electronically Signed   By: Vinnie Langton M.D.   On: 05/07/2016 15:05   Dg Chest Port 1 View  05/06/2016  CLINICAL DATA:  Central line placement EXAM: PORTABLE CHEST 1 VIEW COMPARISON:  05/06/2016 at 17:34 FINDINGS: There is a new left jugular central line with tip extending to the expected location of the cavoatrial junction. There is no pneumothorax. No other interval change. Visible lungs are clear. IMPRESSION: New left jugular central line.  No pneumothorax. Electronically Signed  By: Andreas Newport M.D.   On: 05/06/2016 23:38   Dg Chest Portable 1 View  05/06/2016  CLINICAL DATA:  Sepsis, hypothermia, low blood pressure EXAM: PORTABLE CHEST 1 VIEW COMPARISON:  01/05/2016 FINDINGS: Cardiomediastinal silhouette is stable. The patient is rotated to the right. No infiltrate or pleural effusion. No pulmonary edema. Mild degenerative changes thoracic spine. IMPRESSION: No active disease. Electronically Signed   By: Lahoma Crocker M.D.    On: 05/06/2016 17:48   Ct Extrem Lower Wo Cm Bil  05/07/2016  CLINICAL DATA:  65 year old female with bilateral lower extremity cellulitis. EXAM: CT OF THE LOWER BILATERAL EXTREMITY WITHOUT CONTRAST TECHNIQUE: Multidetector CT imaging of the bilateral lower extremities below the knee was performed according to the standard protocol. COMPARISON:  None. FINDINGS: Evaluation of this exam is limited in the absence of intravenous contrast. There is no acute fracture or dislocation. There is mild osteopenia. No joint effusion. Extensive bilateral skin thickening and diffuse stranding of the subcutaneous soft tissues involving the calves and feet, left greater right compatible with known cellulitis. No drainable fluid collection or abscess identified, however evaluation is limited in the absence of intravenous contrast. No soft tissue gas noted. IMPRESSION: Extensive cellulitis of the thumb bilateral lower extremities, left greater right. No soft tissue gas or fluid collection. Electronically Signed   By: Anner Crete M.D.   On: 05/07/2016 00:30    Lab Data:  CBC:  Recent Labs Lab 05/06/16 1744 05/06/16 2300  05/09/16 0343 05/10/16 0644 05/11/16 0803 05/12/16 0443 05/13/16 0334  WBC 11.9* 13.8*  < > 8.3 11.6* 8.6 7.7 9.3  NEUTROABS 10.8* 12.7*  --   --   --   --   --   --   HGB 10.8* 10.8*  < > 9.0* 9.2* 9.1* 9.3* 9.2*  HCT 33.9* 33.6*  < > 27.5* 29.3* 30.1* 29.7* 30.0*  MCV 85.0 85.1  < > 83.8 87.2 89.6 87.6 87.0  PLT 283 296  < > 197 209 231 217 224  < > = values in this interval not displayed. Basic Metabolic Panel:  Recent Labs Lab 05/06/16 2300 05/07/16 0310 05/08/16 0426  05/09/16 0343 05/10/16 0644 05/11/16 0508 05/12/16 0443 05/13/16 0334  NA 140 142 156*  < > 154* 153* 150* 141 139  K 5.5* 4.9 3.4*  < > 3.1* 3.7 4.1 3.5 3.5  CL 116* 121* 121*  < > 121* 115* 113* 107 107  CO2 7* 8* 21*  < > 28 27 25 25 25   GLUCOSE 178* 92 104*  < > 128* 284* 219* 162* 144*  BUN 165*  154* 88*  < > 45* 30* 20 9 7   CREATININE 3.28* 2.82* 1.61*  < > 1.27* 1.26* 1.08* 0.80 0.80  CALCIUM 7.4* 6.9* 7.7*  < > 7.5* 8.0* 8.1* 7.8* 7.8*  MG 2.0 1.7 1.6*  --  2.1  --   --   --   --   PHOS 8.4* 7.1* 3.4  --  2.6  --   --   --   --   < > = values in this interval not displayed. GFR: Estimated Creatinine Clearance: 64.5 mL/min (by C-G formula based on Cr of 0.8). Liver Function Tests:  Recent Labs Lab 05/06/16 1744 05/06/16 2300 05/10/16 0644  AST 36 41 20  ALT 35 36 29  ALKPHOS 49 49 44  BILITOT 0.6 0.9 0.6  PROT 5.4* 4.9* 4.7*  ALBUMIN 2.5* 2.2* 1.7*   No results for input(s):  LIPASE, AMYLASE in the last 168 hours. No results for input(s): AMMONIA in the last 168 hours. Coagulation Profile:  Recent Labs Lab 05/06/16 2300  INR 1.42   Cardiac Enzymes:  Recent Labs Lab 05/06/16 2300 05/07/16 0310  CKTOTAL 998*  --   TROPONINI 0.03 0.05*   BNP (last 3 results) No results for input(s): PROBNP in the last 8760 hours. HbA1C: No results for input(s): HGBA1C in the last 72 hours. CBG:  Recent Labs Lab 05/12/16 1256 05/12/16 1739 05/12/16 2134 05/13/16 0751 05/13/16 1202  GLUCAP 173* 166* 169* 114* 129*   Lipid Profile: No results for input(s): CHOL, HDL, LDLCALC, TRIG, CHOLHDL, LDLDIRECT in the last 72 hours. Thyroid Function Tests: No results for input(s): TSH, T4TOTAL, FREET4, T3FREE, THYROIDAB in the last 72 hours. Anemia Panel: No results for input(s): VITAMINB12, FOLATE, FERRITIN, TIBC, IRON, RETICCTPCT in the last 72 hours. Urine analysis:    Component Value Date/Time   COLORURINE YELLOW 05/06/2016 1845   APPEARANCEUR HAZY* 05/06/2016 1845   LABSPEC 1.027 05/06/2016 1845   PHURINE 5.0 05/06/2016 1845   GLUCOSEU NEGATIVE 05/06/2016 1845   HGBUR NEGATIVE 05/06/2016 1845   BILIRUBINUR SMALL* 05/06/2016 1845   KETONESUR 15* 05/06/2016 1845   PROTEINUR NEGATIVE 05/06/2016 1845   UROBILINOGEN 1.0 12/05/2007 1244   NITRITE NEGATIVE 05/06/2016  1845   LEUKOCYTESUR NEGATIVE 05/06/2016 1845     RAI,RIPUDEEP M.D. [facility] 05/13/2016, 1:50 PM  Pager: [phone] Between 7am to 7pm - call Pager - [phone]  After 7pm go to [email or web address] - password [email or web address]  Call night coverage person covering after 7pm

## 2016-05-13 NOTE — Progress Notes (Signed)
   05/13/16 1830  Wound / Incision (Open or Dehisced) 05/07/16 Leg Right;Left cellulitis,  Date First Assessed/Time First Assessed: 05/07/16 0007   Location: (c) Leg  Location Orientation: Right;Left  Wound Description (Comments): cellulitis,  Present on Admission: Yes  Dressing Type ABD;Non adherent  Dressing Changed Changed  Dressing Status Clean;Dry;Intact  Dressing Change Frequency Daily  Site / Wound Assessment Bleeding  Drainage Amount Moderate  Drainage Description Other (Comment) (bloody)  bilateral wound dressings changed, left leg more severe and drains bloody fluid, right lower extremity slightly better with more granulation tissue noted but has bloody discharge as well. Pt requires pre medication for pain before dressing change

## 2016-05-13 NOTE — Care Management Important Message (Signed)
Important Message  Patient Details  Name: CAPRISHA HOLLSTEIN MRN: [id number] Date of Birth: 1951-01-15   Medicare Important Message Given:  Yes    Marilu Favre, RN 05/13/2016, 2:34 PM

## 2016-05-14 LAB — GLUCOSE, CAPILLARY
GLUCOSE-CAPILLARY: 195 mg/dL — AB (ref 65–99)
Glucose-Capillary: 120 mg/dL — ABNORMAL HIGH (ref 65–99)
Glucose-Capillary: 144 mg/dL — ABNORMAL HIGH (ref 65–99)
Glucose-Capillary: 188 mg/dL — ABNORMAL HIGH (ref 65–99)

## 2016-05-14 LAB — CBC
HEMATOCRIT: 28.6 % — AB (ref 36.0–46.0)
HEMOGLOBIN: 8.8 g/dL — AB (ref 12.0–15.0)
MCH: 27 pg (ref 26.0–34.0)
MCHC: 30.8 g/dL (ref 30.0–36.0)
MCV: 87.7 fL (ref 78.0–100.0)
Platelets: 235 10*3/uL (ref 150–400)
RBC: 3.26 MIL/uL — AB (ref 3.87–5.11)
RDW: 16.3 % — ABNORMAL HIGH (ref 11.5–15.5)
WBC: 8.7 10*3/uL (ref 4.0–10.5)

## 2016-05-14 LAB — BASIC METABOLIC PANEL
ANION GAP: 9 (ref 5–15)
BUN: 6 mg/dL (ref 6–20)
CALCIUM: 8.1 mg/dL — AB (ref 8.9–10.3)
CHLORIDE: 110 mmol/L (ref 101–111)
CO2: 25 mmol/L (ref 22–32)
Creatinine, Ser: 0.78 mg/dL (ref 0.44–1.00)
GLUCOSE: 125 mg/dL — AB (ref 65–99)
POTASSIUM: 3.7 mmol/L (ref 3.5–5.1)
SODIUM: 144 mmol/L (ref 135–145)

## 2016-05-14 MED ORDER — LABETALOL HCL 100 MG PO TABS: 200.0000 mg | ORAL_TABLET | Freq: Two times a day (BID) | ORAL | Status: DC

## 2016-05-14 MED ORDER — LABETALOL HCL 100 MG PO TABS
100.0000 mg | ORAL_TABLET | Freq: Two times a day (BID) | ORAL | Status: DC
Start: 2016-05-14 — End: 2016-05-15
  Administered 2016-05-14 – 2016-05-15 (×2): 100 mg via ORAL
  Filled 2016-05-14 (×2): qty 1

## 2016-05-14 MED ORDER — LACTULOSE 10 GM/15ML PO SOLN: 20.0000 g | Freq: Two times a day (BID) | ORAL | Status: DC | PRN

## 2016-05-14 NOTE — Progress Notes (Signed)
Physical Therapy Treatment Patient Details Name: Angela Hays MRN: [id number] DOB: [birth year] Today's Date: 05/14/2016    History of Present Illness 65yo female with history of hypertension, diabetes, hyperlipidemia, and CVA who was brought to the ED with malaise, decreased appetite, pain, and edema in both lower extremities. Pt diagnosed with Septic shock due to B LE cellulitis.     PT Comments    Patient continues to be very limited in her physical ability.  Patient required max assist for squat pivot transfer bed to chair.  Per patient and son, patient was fairly independent with transfers PTA.  I did speak with son who reports he can care for patient at home and provide max assistance for transfers.  If son can provide assistance, feel patient can return home with son and HHPT/aides.  If son unable to provide assistance, patient will need SNF to increase mobility for return home.     Follow Up Recommendations  Supervision/Assistance - 24 hour;Home health PT     Equipment Recommendations  None recommended by PT    Recommendations for Other Services       Precautions / Restrictions Precautions Precautions: Fall    Mobility  Bed Mobility Overal bed mobility: Needs Assistance Bed Mobility: Supine to Sit     Supine to sit: Mod assist     General bed mobility comments: Mod assist with LEs and trunk with transfers.   Transfers Overall transfer level: Needs assistance Equipment used: None Transfers: Squat Pivot Transfers Sit to Stand: Max assist         General transfer comment: required max assistance to clear gluts off bed for transfer  Ambulation/Gait                 Stairs            Wheelchair Mobility    Modified Rankin (Stroke Patients Only)       Balance Overall balance assessment: Needs assistance Sitting-balance support: No upper extremity supported Sitting balance-Leahy Scale: Fair                               Cognition Arousal/Alertness: Awake/alert Behavior During Therapy: WFL for tasks assessed/performed Overall Cognitive Status: Within Functional Limits for tasks assessed                      Exercises      General Comments General comments (skin integrity, edema, etc.): spoke with son, Angela Hays, he reports patient was fairly independent with transfer PTA, reports he can provide max assist and 24 hour assistance for patient at discharge.      Pertinent Vitals/Pain Pain Assessment: No/denies pain    Home Living                      Prior Function            PT Goals (current goals can now be found in the care plan section) Progress towards PT goals: Progressing toward goals    Frequency  Min 3X/week    PT Plan Current plan remains appropriate    Co-evaluation             End of Session   Activity Tolerance: Patient tolerated treatment well Patient left: in chair;with call bell/phone within reach     Time: 1122-1146 PT Time Calculation (min) (ACUTE ONLY): 24 min  Charges:  $Therapeutic Activity: 23-37 mins  G CodesShanna Cisco 06/02/16, 11:50 AM  06/02/16 [place], [place]

## 2016-05-14 NOTE — Progress Notes (Signed)
[facility] Progress Note  Patient: Angela Hays [id number]   PCP: Vena Austria, MD DOB: [birth year]   DOA: 05/06/2016   DOS: 05/14/2016   Date of Service: the patient was seen and examined on 05/14/2016  Subjective: Patient numbness of fatigue as well as pain while dressing change. Denies any other acute complaint. On nausea no vomiting. Nutrition: Tolerating oral diet  Brief hospital course: 65yo with history of hypertension, diabetes, hyperlipidemia, and CVA who was brought to the ED with malaise, decreased appetite, pain, and edema in both lower extremities. Patient has chronic lower extremity venous ulcers and an admission in January 2017 for sepsis from lower extremity cellulitis. In ED she was found to be tachycardic (110s), hypothermic (T 90.3) and hypotensive despite a 30cc/kg fluid bolus with acute kidney injury with K of 6. CT of the head noted a hyperdense focus over left frontal region ? SAH.  Patient was admitted to ICU by critical care service, transferred to [facility] service on 5/15  Assessment and Plan: Septic shock due to B LE cellulitis - gram-negative bacteremia/Alcaligenes species  - Multiple full thickness wounds spread across anterior calves + L foot wound with gram-negative bacteremia. -Culture and sensitivity noted, Dr Candiss Norse discussed with ID physician on call Dr. Graylon Good recommended total 14 days of antibiotics based on sensitivity, can be switched to oral upon discharge. Last day of ABX May 25th, 2017.  Per wound care, Left leg more severe with a bloody fluid. Continue wound care. Appreciate consult.  Acute delirium- somnolent - SAH ruled out on f/u CT head - likely related to sepsis/ metabolic issues  - Increased mobility,  Mildly elevated ammonia, negative asterixis, use lactulose when necessary for constipation. ABG normal.  AKI resolved - Pre renal vs ATN versus due to sepsis  - Creatinine function has improved  - Lasix and ARB on hold.    Normocytic anemia  - H&H stable, no evidence of acute blood loss mild worsening today. We will monitor.  AG metabolic acidosis due to sepsis and acute renal failure. - Resolved with hydration and IV antibiotics - Creatinine function stable  Hypernatremia - Improving with IV fluids, likely due to dehydration   Hx Rt CVA - continue Plavix and statin for secondary prevention - PT eval-> HH PT recommended   Diabetes mellitus - CBGs currently stable   Diarrhea C. difficile negative  Essential hypertension. Borderline hypotension. We will reduce the dose of labetalol from 200-100  Pain management: When necessary Percocet not using it Activity: Home health with supervision 24 hours physical therapy Bowel regimen: last BM 05/12/2016 Diet: Regular diet DVT Prophylaxis: subcutaneous Heparin  Advance goals of care discussion: Full code  Family Communication: no family was present at bedside, at the time of interview.   Disposition:  Discharge to home, home health Expected discharge date: 05/15/2016 pending blood pressure stabilization  Consultants: none Procedures: Wound care  Antibiotics: Anti-infectives    Start     Dose/Rate Route Frequency Ordered Stop   05/12/16 2000  ciprofloxacin (CIPRO) tablet 500 mg     500 mg Oral 2 times daily 05/12/16 1932     05/11/16 0800  Ampicillin-Sulbactam (UNASYN) 3 g in sodium chloride 0.9 % 100 mL IVPB  Status:  Discontinued    Comments:  Unasyn 3 g IV q6h for CrCl > 30 mL/min   3 g 100 mL/hr over 60 Minutes Intravenous Every 6 hours 05/11/16 0744 05/12/16 1932   05/10/16 1800  ciprofloxacin (CIPRO) IVPB 400 mg  Status:  Discontinued    Comments:  Pharmacy may adjust dose as indicated   400 mg 200 mL/hr over 60 Minutes Intravenous Every 12 hours 05/10/16 1703 05/11/16 0736   05/08/16 2000  vancomycin (VANCOCIN) IVPB 1000 mg/200 mL premix  Status:  Discontinued     1,000 mg 200 mL/hr over 60 Minutes Intravenous Every 48 hours  05/06/16 2153 05/08/16 0953   05/08/16 1100  vancomycin (VANCOCIN) IVPB 1000 mg/200 mL premix  Status:  Discontinued     1,000 mg 200 mL/hr over 60 Minutes Intravenous Every 24 hours 05/08/16 0953 05/10/16 1703   05/08/16 1000  piperacillin-tazobactam (ZOSYN) IVPB 3.375 g  Status:  Discontinued     3.375 g 12.5 mL/hr over 240 Minutes Intravenous Every 8 hours 05/08/16 0953 05/10/16 1703   05/07/16 0200  piperacillin-tazobactam (ZOSYN) IVPB 2.25 g  Status:  Discontinued     2.25 g 100 mL/hr over 30 Minutes Intravenous Every 8 hours 05/06/16 2149 05/08/16 0953   05/06/16 2200  clindamycin (CLEOCIN) IVPB 900 mg  Status:  Discontinued     900 mg 100 mL/hr over 30 Minutes Intravenous Every 8 hours 05/06/16 2145 05/09/16 0909   05/06/16 1715  vancomycin (VANCOCIN) 1,500 mg in sodium chloride 0.9 % 500 mL IVPB     1,500 mg 250 mL/hr over 120 Minutes Intravenous  Once 05/06/16 1712 05/06/16 1940   05/06/16 1715  piperacillin-tazobactam (ZOSYN) IVPB 3.375 g     3.375 g 100 mL/hr over 30 Minutes Intravenous  Once 05/06/16 1712 05/06/16 1805        Intake/Output Summary (Last 24 hours) at 05/14/16 1813 Last data filed at 05/14/16 1547  Gross per 24 hour  Intake    720 ml  Output    200 ml  Net    520 ml   Filed Weights   05/12/16 0414 05/13/16 0357 05/14/16 0300  Weight: 68.53 kg (151 lb 1.3 oz) 71.2 kg (156 lb 15.5 oz) 70.5 kg (155 lb 6.8 oz)    Objective: Physical Exam: Filed Vitals:   05/14/16 0609 05/14/16 1357 05/14/16 1522 05/14/16 1524  BP: 133/69 90/41 103/47 115/49  Pulse: 76 62 69 70  Temp: 99.1 F (37.3 C) 98.5 F (36.9 C)    TempSrc: Oral Oral    Resp: 18 18 20 22   Height:      Weight:      SpO2: 95% 100% 100% 100%    General: Alert, Awake and Oriented to Time, Place and Person. Appear in mild distress Eyes: PERRL, Conjunctiva normal ENT: Oral Mucosa clear moist. Neck: difficult to assess  JVD, no Abnormal Mass Or lumps Cardiovascular: S1 and S2 Present, no  Murmur, Peripheral Pulses Present Respiratory: Bilateral Air entry equal and Decreased, Clear to Auscultation, no Crackles, no wheezes Abdomen: Bowel Sound present, Soft and no tenderness Skin: Bilateral dressing on lower extremity Extremities: bilateral  Pedal edema, no calf tenderness Neurologic: Grossly no focal neuro deficit. Bilaterally Equal motor strength  Data Reviewed: CBC:  Recent Labs Lab 05/10/16 0644 05/11/16 0803 05/12/16 0443 05/13/16 0334 05/14/16 0341  WBC 11.6* 8.6 7.7 9.3 8.7  HGB 9.2* 9.1* 9.3* 9.2* 8.8*  HCT 29.3* 30.1* 29.7* 30.0* 28.6*  MCV 87.2 89.6 87.6 87.0 87.7  PLT 209 231 217 224 [id number]   Basic Metabolic Panel:  Recent Labs Lab 05/08/16 0426  05/09/16 0343 05/10/16 0644 05/11/16 0508 05/12/16 0443 05/13/16 0334 05/14/16 0341  NA 156*  < > 154* 153* 150* 141 139 144  K 3.4*  < >  3.1* 3.7 4.1 3.5 3.5 3.7  CL 121*  < > 121* 115* 113* 107 107 110  CO2 21*  < > 28 27 25 25 25 25   GLUCOSE 104*  < > 128* 284* 219* 162* 144* 125*  BUN 88*  < > 45* 30* 20 9 7 6   CREATININE 1.61*  < > 1.27* 1.26* 1.08* 0.80 0.80 0.78  CALCIUM 7.7*  < > 7.5* 8.0* 8.1* 7.8* 7.8* 8.1*  MG 1.6*  --  2.1  --   --   --   --   --   PHOS 3.4  --  2.6  --   --   --   --   --   < > = values in this interval not displayed.  Liver Function Tests:  Recent Labs Lab 05/10/16 0644  AST 20  ALT 29  ALKPHOS 44  BILITOT 0.6  PROT 4.7*  ALBUMIN 1.7*   No results for input(s): LIPASE, AMYLASE in the last 168 hours.  Recent Labs Lab 05/13/16 1435  AMMONIA 40*   Coagulation Profile: No results for input(s): INR, PROTIME in the last 168 hours. Cardiac Enzymes: No results for input(s): CKTOTAL, CKMB, CKMBINDEX, TROPONINI in the last 168 hours. BNP (last 3 results) No results for input(s): PROBNP in the last 8760 hours.  CBG:  Recent Labs Lab 05/13/16 1647 05/13/16 2153 05/14/16 0743 05/14/16 1152 05/14/16 1704  GLUCAP 153* 144* 120* 144* 188*    Studies: No  results found.   Scheduled Meds: . ciprofloxacin  500 mg Oral BID  . clopidogrel  75 mg Oral Daily  . doxazosin  8 mg Oral QHS  . heparin subcutaneous  5,000 Units Subcutaneous Q8H  . insulin aspart  0-15 Units Subcutaneous TID WC  . insulin aspart  0-5 Units Subcutaneous QHS  . labetalol  200 mg Oral BID  . simvastatin  20 mg Oral Q breakfast   Continuous Infusions:  PRN Meds: albuterol, oxyCODONE-acetaminophen, sodium chloride flush  Time spent: 30 minutes  Author: Berle Mull, MD [facility] Pager: [phone] 05/14/2016 6:13 PM  If 7PM-7AM, please contact night-coverage at [email or web address], password [facility]

## 2016-05-15 DIAGNOSIS — E1151 Type 2 diabetes mellitus with diabetic peripheral angiopathy without gangrene: Secondary | ICD-10-CM

## 2016-05-15 LAB — GLUCOSE, CAPILLARY
GLUCOSE-CAPILLARY: 129 mg/dL — AB (ref 65–99)
Glucose-Capillary: 81 mg/dL (ref 65–99)

## 2016-05-15 MED ORDER — FUROSEMIDE 40 MG PO TABS: 20.0000 mg | ORAL_TABLET | Freq: Every day | ORAL | Status: DC | PRN

## 2016-05-15 MED ORDER — LACTULOSE 10 GM/15ML PO SOLN: 20.0000 g | Freq: Two times a day (BID) | ORAL | Status: DC | PRN

## 2016-05-15 MED ORDER — CIPROFLOXACIN HCL 500 MG PO TABS: 500.0000 mg | ORAL_TABLET | Freq: Two times a day (BID) | ORAL | Status: AC

## 2016-05-15 MED ORDER — LABETALOL HCL 200 MG PO TABS: 100.0000 mg | ORAL_TABLET | Freq: Two times a day (BID) | ORAL | Status: DC

## 2016-05-15 NOTE — Care Management Note (Signed)
Case Management Note  Patient Details  Name: Angela Hays MRN: [id number] Date of Birth: December 19, 1951  Subjective/Objective:                  Malaise, decreased appetite. Worsening pain and edema in LEs  PMH: hypertension, diabetes, hyperlipidemia, rt CVA, chronic lower extremity venous ulcers  Action/Plan: CM spoke to patient at the bedside regarding [facility] services and patient chose [facility] for care. CM advised that PT/OT/RN/ aide were ordered and she was agreeable. Patient has a wheelchair at home along with 3N1 and Walker. The patient said she has no other DME needs but to call her son, [name], to see if he has any questions or needs to know anything. CM called her son [name] @ [phone] and he confirmed the information that the patient provided. He said that the patient is able to bath herself but that he will be there to provide the 24/7 care that she will need. He denied any further needs at this time. CM remains available should additional needs arise.   Expected Discharge Date:  05/15/16              Expected Discharge Plan:  [facility]  In-House Referral:     Discharge planning Services  CM Consult  Post Acute Care Choice:  Home Health Choice offered to:  Patient, Adult Children  DME Arranged:    DME Agency:     HH Arranged:  RN, PT, OT, Nurse's Aide [place] Agency:     Status of Service:  Completed, signed off  Medicare Important Message Given:  Yes Date Medicare IM Given:    Medicare IM give by:    Date Additional Medicare IM Given:    Additional Medicare Important Message give by:     If discussed at [place] of Stay Meetings, dates discussed:    Additional Comments:  Guido Sander, RN 05/15/2016, 11:38 AM

## 2016-05-15 NOTE — Discharge Summary (Signed)
[facility] Discharge Summary   Patient: Angela Hays [id number]   PCP: Vena Austria, MD DOB: June 25, 1951   Date of admission: 05/06/2016   Date of discharge: 05/15/2016     Discharge Diagnoses:  Principal Problem:   Sepsis ([place]) Active Problems:   DM (diabetes mellitus), type 2 with peripheral vascular complications ([place])   Cellulitis and abscess of leg   ARF (acute renal failure) ([place])   Leg ulcer ([place])  Recommendations for Outpatient Follow-up:  1. Please follow up with PCP in 1 week   Follow-up Information    Follow up with Vena Austria, MD. Schedule an appointment as soon as possible for a visit in 1 week.   Specialty:  Family Medicine   Why:  for wound and blood pressure check   Contact information:   [place] [id number] [phone]       Follow up with [place].   Why:  Home health for physical therapy, occupational therapy, nurse aide and registered nurse. Please call number listed above for questions.    Contact information:   [address] [place] [address] [phone]      Diet recommendation: cardiac diet  Activity: The patient is advised to gradually reintroduce usual activities.  Discharge Condition: good  History of present illness: As per the H and P dictated on admission, "Patient is a poor historian. History of present illness obtained from chart. No family at bedside.  65 year old with history of hypertension, diabetes, hyperlipidemia, rt CVA. Brought to the ED with malaise, decreased appetite, pain, edema in lower extremities. Patient has chronic lower extremity venous ulcers and an admission in January 2017 for sepsis from lower extremity cellulitis.  In ED she was found to be tachycardic (110s), hypothermic (T 90.3). Hypotensive inspite of 30cc/kg fluid bolus. Also noted to be in acute kidney injury with K of 6, LA is normal. Noted to have a CT of the  head with hyperdense focus over left frontal region ? SAH. She was given calcium, started on peripheral norepi and PCCM called for admission"  Hospital Course:  Summary of her active problems in the hospital is as following.  Septic shock due to bilateral LE cellulitis - gram-negative bacteremia/Alcaligenes species  - Multiple full thickness wounds spread across anterior calves + L foot wound with gram-negative bacteremia. -Culture and sensitivity noted, Dr Candiss Norse discussed with ID physician on call Dr. Graylon Good recommended total 14 days of antibiotics based on sensitivity, can be switched to oral upon discharge. Last day of ABX May 25th, 2017.  Appreciate wound care consult. Home health RN for dressing change at home.  Acute delirium- somnolent - SAH ruled out on f/u CT head - likely related to sepsis/ metabolic issues  - Increased mobility,  Mildly elevated ammonia, negative asterixis, use lactulose when necessary for constipation. ABG normal.  AKI resolved - Pre renal vs ATN versus due to sepsis  - Creatinine function has improved  - Lasix and ARB on hold.   Normocytic anemia  - H&H stable, no evidence of acute blood loss mild worsening today. We will monitor.  AG metabolic acidosis due to sepsis and acute renal failure. - Resolved with hydration and IV antibiotics - Creatinine function stable  Hypernatremia - Improving with IV fluids, likely due to dehydration   Hx Rt CVA - continue Plavix and statin for secondary prevention - PT eval-> HH PT recommended   Diabetes mellitus - CBGs currently stable  Diarrhea C. difficile negative  Essential hypertension. Borderline hypotension. Stopping all of her BP medication since her BP has been stable here in the hospital. We will reduce the dose of labetalol from 200-100  All other chronic medical condition were stable during the hospitalization.  Patient was seen by physical therapy, who recommended home health,  which was arranged by Education officer, museum and case Freight forwarder. On the day of the discharge the patient's vitals were stable, and no other acute medical condition were reported by patient. the patient was felt safe to be discharge at home with home health.  Procedures and Results:  none   Consultations:  PCCM primary admission  DISCHARGE MEDICATION: Discharge Medication List as of 05/15/2016 12:48 PM    START taking these medications   Details  ciprofloxacin (CIPRO) 500 MG tablet Take 1 tablet (500 mg total) by mouth 2 (two) times daily., Starting 05/15/2016, Until Thu 05/19/16, Normal    lactulose (CHRONULAC) 10 GM/15ML solution Take 30 mLs (20 g total) by mouth 2 (two) times daily as needed for mild constipation., Starting 05/15/2016, Until Discontinued, Normal      CONTINUE these medications which have CHANGED   Details  furosemide (LASIX) 40 MG tablet Take 0.5 tablets (20 mg total) by mouth daily as needed for fluid or edema (for weight gain of more then 2 Lbs in 1 day)., Starting 05/15/2016, Until Discontinued, No Print    labetalol (NORMODYNE) 200 MG tablet Take 0.5 tablets (100 mg total) by mouth 2 (two) times daily., Starting 05/15/2016, Until Discontinued, Normal      CONTINUE these medications which have NOT CHANGED   Details  clopidogrel (PLAVIX) 75 MG tablet Take 75 mg by mouth daily., Until Discontinued, Historical Med    doxazosin (CARDURA) 8 MG tablet Take 8 mg by mouth at bedtime., Until Discontinued, Historical Med    insulin glargine (LANTUS) 100 UNIT/ML injection Inject 15 Units into the skin every evening., Until Discontinued, Historical Med    metFORMIN (GLUCOPHAGE) 850 MG tablet Take 850 mg by mouth 2 (two) times daily with a meal., Until Discontinued, Historical Med    Multiple Minerals-Vitamins (NUTRA-SUPPORT BONE) CAPS Take 1 tablet by mouth daily., Until Discontinued, Historical Med    simvastatin (ZOCOR) 20 MG tablet Take 20 mg by mouth daily with breakfast., Until  Discontinued, Historical Med      STOP taking these medications     amLODipine (NORVASC) 10 MG tablet      cloNIDine (CATAPRES) 0.2 MG tablet      doxycycline (VIBRA-TABS) 100 MG tablet      hydrochlorothiazide (HYDRODIURIL) 25 MG tablet      potassium chloride SA (K-DUR,KLOR-CON) 20 MEQ tablet      valsartan (DIOVAN) 320 MG tablet        No Known Allergies Discharge Instructions    Diet - low sodium heart healthy    Complete by:  As directed      Diet Carb Modified    Complete by:  As directed      Discharge instructions    Complete by:  As directed   It is important that you read following instructions as well as go over your medication list with RN to help you understand your care after this hospitalization.  Discharge Instructions: Please follow-up with PCP in one week  Please request your primary care physician to go over all Hospital Tests and Procedure/Radiological results at the follow up,  Please get all Hospital records sent to your PCP by signing  hospital release before you go home.   Do not take more than prescribed Pain, Sleep and Anxiety Medications. You were cared for by a hospitalist during your hospital stay. If you have any questions about your discharge medications or the care you received while you were in the hospital after you are discharged, you can call the unit and ask to speak with the hospitalist on call if the hospitalist that took care of you is not available.  Once you are discharged, your primary care physician will handle any further medical issues. Please note that NO REFILLS for any discharge medications will be authorized once you are discharged, as it is imperative that you return to your primary care physician (or establish a relationship with a primary care physician if you do not have one) for your aftercare needs so that they can reassess your need for medications and monitor your lab values. You Must read complete instructions/literature  along with all the possible adverse reactions/side effects for all the Medicines you take and that have been prescribed to you. Take any new Medicines after you have completely understood and accept all the possible adverse reactions/side effects. Wear Seat belts while driving. If you have smoked or chewed Tobacco in the last 2 yrs please stop smoking and/or stop any Recreational drug use.     Discharge wound care:    Complete by:  As directed   Bilateral ABD;Non adherentchanged every other day.     Increase activity slowly    Complete by:  As directed           Discharge Exam: Filed Weights   05/13/16 0357 05/14/16 0300 05/15/16 0400  Weight: 71.2 kg (156 lb 15.5 oz) 70.5 kg (155 lb 6.8 oz) 69 kg (152 lb 1.9 oz)   Filed Vitals:   05/14/16 2129 05/15/16 0547  BP: 151/72 135/69  Pulse: 86 86  Temp: 98.9 F (37.2 C) 98.7 F (37.1 C)  Resp: 20 20   General: Appear in no distress, no Rash; Oral Mucosa moist. Cardiovascular: S1 and S2 Present, no Murmur, no JVD Respiratory: Bilateral Air entry present and Clear to Auscultation, no Crackles, no wheezes Abdomen: Bowel Sound present, Soft and no tenderness Extremities: bilateral trace Pedal edema, no calf tenderness Neurology: Grossly no focal neuro deficit.  The results of significant diagnostics from this hospitalization (including imaging, microbiology, ancillary and laboratory) are listed below for reference.    Significant Diagnostic Studies: Ct Head Wo Contrast  05/10/2016  CLINICAL DATA:  Follow-up subarachnoid hemorrhage. History of hypertension, diabetes and stroke. EXAM: CT HEAD WITHOUT CONTRAST TECHNIQUE: Contiguous axial images were obtained from the base of the skull through the vertex without intravenous contrast. COMPARISON:  CT HEAD May 06, 2016 FINDINGS: INTRACRANIAL CONTENTS: Up on close revealed prior CT there was motion at the level of suspicious density and, this was artifact. There 2 contiguous dural base  calcifications along the anterior falx. No intraparenchymal hemorrhage, mass effect, midline shift or acute large vascular territory infarcts. Old bilateral cerebellar infarcts. Old LEFT caudate and RIGHT thalamus infarcts. Patchy to confluent supratentorial white matter hypodensities. Ventricles and sulci are overall normal for patient's age. No abnormal extra-axial fluid collections. Basal cisterns are patent. Mild calcific atherosclerosis of the carotid siphons. ORBITS: The included ocular globes and orbital contents are non-suspicious. SINUSES: Trace paranasal sinus mucosal thickening without air-fluid levels. SKULL/SOFT TISSUES: No skull fracture. No significant soft tissue swelling. Probable empty sella. Patient is edentulous. IMPRESSION: No acute intracranial process. LEFT frontal density  on prior CT was artifact. Chronic changes including old LEFT caudate, RIGHT thalamus and bilateral cerebellar infarcts. Moderate to severe chronic small vessel ischemic disease. Electronically Signed   By: Elon Alas M.D.   On: 05/10/2016 05:10   Ct Head Wo Contrast  05/06/2016  CLINICAL DATA:  Increased slurred speech. Not eating. Stroke 2008 with bilateral deficits. EXAM: CT HEAD WITHOUT CONTRAST TECHNIQUE: Contiguous axial images were obtained from the base of the skull through the vertex without intravenous contrast. COMPARISON:  12/05/2007 FINDINGS: Ventricles and cisterns are within normal. There is mild chronic ischemic microvascular disease. Small old central periventricular and basal gangliar infarct. 1.5 cm hypodensity over the right cerebral hemisphere without mass effect and sub cm hypodensity over the left cerebral hemisphere likely old infarcts. No evidence of mass effect or shift of midline structures. No definite acute infarct. Calcification along the anterior interhemispheric fissure. There is an adjacent 3-4 mm hyperdense focus just left of midline over the left frontal region likely early  calcification although cannot exclude a small focus of acute subarachnoid hemorrhage. Remaining bones and soft tissues are within normal. IMPRESSION: 3-4 mm hyperdense focus just left of midline over the frontal region which may be due to early calcification along the fissure, although cannot exclude a small focus of acute subarachnoid hemorrhage. Chronic ischemic microvascular disease and old small infarcts as described. These results were called by telephone at the time of interpretation on 05/06/2016 at 6:32 pm to Dr. Tyrone Nine, who verbally acknowledged these results. Electronically Signed   By: Marin Olp M.D.   On: 05/06/2016 18:33   Dg Chest Port 1 View  05/07/2016  CLINICAL DATA:  65 year old female with history of acute respiratory failure. EXAM: PORTABLE CHEST 1 VIEW COMPARISON:  No priors. FINDINGS: Left-sided internal jugular central venous catheter with tip terminating in the distal superior vena cava. Low lung volumes. Interstitial prominence in the left lung. No confluent consolidative airspace disease. No pleural effusions. No evidence of pulmonary edema. Heart size is normal. Upper mediastinal contours are within normal limits. Atherosclerosis in the thoracic aorta. IMPRESSION: 1. Support apparatus, as above. 2. Interval development of interstitial prominence throughout the low left lung, concerning for potential developing bronchopneumonia. 3. Atherosclerosis. Electronically Signed   By: Vinnie Langton M.D.   On: 05/07/2016 15:05   Dg Chest Port 1 View  05/06/2016  CLINICAL DATA:  Central line placement EXAM: PORTABLE CHEST 1 VIEW COMPARISON:  05/06/2016 at 17:34 FINDINGS: There is a new left jugular central line with tip extending to the expected location of the cavoatrial junction. There is no pneumothorax. No other interval change. Visible lungs are clear. IMPRESSION: New left jugular central line.  No pneumothorax. Electronically Signed   By: Andreas Newport M.D.   On: 05/06/2016 23:38     Dg Chest Portable 1 View  05/06/2016  CLINICAL DATA:  Sepsis, hypothermia, low blood pressure EXAM: PORTABLE CHEST 1 VIEW COMPARISON:  01/05/2016 FINDINGS: Cardiomediastinal silhouette is stable. The patient is rotated to the right. No infiltrate or pleural effusion. No pulmonary edema. Mild degenerative changes thoracic spine. IMPRESSION: No active disease. Electronically Signed   By: Lahoma Crocker M.D.   On: 05/06/2016 17:48   Ct Extrem Lower Wo Cm Bil  05/07/2016  CLINICAL DATA:  65 year old female with bilateral lower extremity cellulitis. EXAM: CT OF THE LOWER BILATERAL EXTREMITY WITHOUT CONTRAST TECHNIQUE: Multidetector CT imaging of the bilateral lower extremities below the knee was performed according to the standard protocol. COMPARISON:  None. FINDINGS: Evaluation  of this exam is limited in the absence of intravenous contrast. There is no acute fracture or dislocation. There is mild osteopenia. No joint effusion. Extensive bilateral skin thickening and diffuse stranding of the subcutaneous soft tissues involving the calves and feet, left greater right compatible with known cellulitis. No drainable fluid collection or abscess identified, however evaluation is limited in the absence of intravenous contrast. No soft tissue gas noted. IMPRESSION: Extensive cellulitis of the thumb bilateral lower extremities, left greater right. No soft tissue gas or fluid collection. Electronically Signed   By: Anner Crete M.D.   On: 05/07/2016 00:30    Microbiology: Recent Results (from the past 240 hour(s))  Blood Culture (routine x 2)     Status: None   Collection Time: 05/06/16  5:25 PM  Result Value Ref Range Status   Specimen Description BLOOD RIGHT HAND  Final   Special Requests IN PEDIATRIC BOTTLE 2CC  Final   Culture NO GROWTH 5 DAYS  Final   Report Status 05/12/2016 FINAL  Final  Blood Culture (routine x 2)     Status: Abnormal   Collection Time: 05/06/16  5:30 PM  Result Value Ref Range  Status   Specimen Description BLOOD RIGHT ARM  Final   Special Requests IN PEDIATRIC BOTTLE 2CC  Final   Culture  Setup Time   Final    PED GRAM NEGATIVE RODS CRITICAL RESULT CALLED TO, READ BACK BY AND VERIFIED WITH: TO SFLINT(RN) BY TCLEVELAND 05/07/2016 AT 11:39PM Organism ID to follow    Culture ALCALIGENES SPECIES (A)  Final   Report Status 05/10/2016 FINAL  Final   Organism ID, Bacteria ALCALIGENES SPECIES  Final      Susceptibility   Alcaligenes species - MIC*    CEFAZOLIN 32 INTERMEDIATE Intermediate     GENTAMICIN 4 SENSITIVE Sensitive     CIPROFLOXACIN 1 SENSITIVE Sensitive     IMIPENEM 0.5 SENSITIVE Sensitive     TRIMETH/SULFA <=20 SENSITIVE Sensitive     PIP/TAZO Value in next row Sensitive      SENSITIVE<=4    * ALCALIGENES SPECIES  Urine culture     Status: Abnormal   Collection Time: 05/06/16  6:45 PM  Result Value Ref Range Status   Specimen Description URINE, CATHETERIZED  Final   Special Requests NONE  Final   Culture <10,000 COLONIES/mL INSIGNIFICANT GROWTH (A)  Final   Report Status 05/08/2016 FINAL  Final  MRSA PCR Screening     Status: Abnormal   Collection Time: 05/07/16 12:25 AM  Result Value Ref Range Status   MRSA by PCR POSITIVE (A) NEGATIVE Final    Comment:        The GeneXpert MRSA Assay (FDA approved for NASAL specimens only), is one component of a comprehensive MRSA colonization surveillance program. It is not intended to diagnose MRSA infection nor to guide or monitor treatment for MRSA infections. RESULT CALLED TO, READ BACK BY AND VERIFIED WITH:  TO BWILSON(RN) BY [facility] 05/07/2016 AT 4:11AM   C difficile quick scan w PCR reflex     Status: None   Collection Time: 05/11/16 11:07 AM  Result Value Ref Range Status   C Diff antigen NEGATIVE NEGATIVE Final   C Diff toxin NEGATIVE NEGATIVE Final   C Diff interpretation Negative for toxigenic C. difficile  Final     Labs: CBC:  Recent Labs Lab 05/10/16 0644 05/11/16 0803  05/12/16 0443 05/13/16 0334 05/14/16 0341  WBC 11.6* 8.6 7.7 9.3 8.7  HGB 9.2* 9.1*  9.3* 9.2* 8.8*  HCT 29.3* 30.1* 29.7* 30.0* 28.6*  MCV 87.2 89.6 87.6 87.0 87.7  PLT 209 231 217 224 [id number]   Basic Metabolic Panel:  Recent Labs Lab 05/09/16 0343 05/10/16 0644 05/11/16 0508 05/12/16 0443 05/13/16 0334 05/14/16 0341  NA 154* 153* 150* 141 139 144  K 3.1* 3.7 4.1 3.5 3.5 3.7  CL 121* 115* 113* 107 107 110  CO2 28 27 25 25 25 25   GLUCOSE 128* 284* 219* 162* 144* 125*  BUN 45* 30* 20 9 7 6   CREATININE 1.27* 1.26* 1.08* 0.80 0.80 0.78  CALCIUM 7.5* 8.0* 8.1* 7.8* 7.8* 8.1*  MG 2.1  --   --   --   --   --   PHOS 2.6  --   --   --   --   --    Liver Function Tests:  Recent Labs Lab 05/10/16 0644  AST 20  ALT 29  ALKPHOS 44  BILITOT 0.6  PROT 4.7*  ALBUMIN 1.7*   No results for input(s): LIPASE, AMYLASE in the last 168 hours.  Recent Labs Lab 05/13/16 1435  AMMONIA 40*   Cardiac Enzymes: No results for input(s): CKTOTAL, CKMB, CKMBINDEX, TROPONINI in the last 168 hours. BNP (last 3 results)  Recent Labs  01/05/16 1031  BNP 53.7   CBG:  Recent Labs Lab 05/14/16 1152 05/14/16 1704 05/14/16 2131 05/15/16 0747 05/15/16 1159  GLUCAP 144* 188* 195* 129* 81   Time spent: 30 minutes  Signed:  Tayelor Osborne  [facility] 05/15/2016 , 5:51 PM

## 2018-02-27 ENCOUNTER — Other Ambulatory Visit: Payer: Self-pay | Admitting: Nephrology

## 2018-02-27 DIAGNOSIS — N184 Chronic kidney disease, stage 4 (severe): Secondary | ICD-10-CM

## 2018-03-05 ENCOUNTER — Other Ambulatory Visit: Payer: Medicare Other

## 2018-03-19 ENCOUNTER — Other Ambulatory Visit: Payer: Medicare Other

## 2018-05-15 ENCOUNTER — Inpatient Hospital Stay: Admission: RE | Admit: 2018-05-15 | Payer: Medicare Other | Source: Ambulatory Visit

## 2018-08-21 ENCOUNTER — Encounter (HOSPITAL_COMMUNITY): Payer: Self-pay

## 2018-08-21 ENCOUNTER — Emergency Department (HOSPITAL_COMMUNITY)
Admission: EM | Admit: 2018-08-21 | Discharge: 2018-08-22 | Disposition: A | Discharge status: Home / Self Care | Payer: Medicare Other | Attending: Emergency Medicine | Admitting: Emergency Medicine

## 2018-08-21 ENCOUNTER — Other Ambulatory Visit: Payer: Self-pay

## 2018-08-21 DIAGNOSIS — Z79899 Other long term (current) drug therapy: Secondary | ICD-10-CM | POA: Diagnosis not present

## 2018-08-21 DIAGNOSIS — Z7902 Long term (current) use of antithrombotics/antiplatelets: Secondary | ICD-10-CM | POA: Diagnosis not present

## 2018-08-21 DIAGNOSIS — Z8673 Personal history of transient ischemic attack (TIA), and cerebral infarction without residual deficits: Secondary | ICD-10-CM | POA: Insufficient documentation

## 2018-08-21 DIAGNOSIS — I10 Essential (primary) hypertension: Secondary | ICD-10-CM | POA: Insufficient documentation

## 2018-08-21 DIAGNOSIS — Z87891 Personal history of nicotine dependence: Secondary | ICD-10-CM | POA: Insufficient documentation

## 2018-08-21 DIAGNOSIS — R739 Hyperglycemia, unspecified: Secondary | ICD-10-CM

## 2018-08-21 DIAGNOSIS — E1165 Type 2 diabetes mellitus with hyperglycemia: Secondary | ICD-10-CM | POA: Diagnosis present

## 2018-08-21 LAB — URINALYSIS, ROUTINE W REFLEX MICROSCOPIC
BILIRUBIN URINE: NEGATIVE
Bacteria, UA: NONE SEEN
Glucose, UA: >=500 mg/dL — AB
HGB URINE DIPSTICK: NEGATIVE
KETONES UR: NEGATIVE mg/dL
LEUKOCYTES UA: NEGATIVE
Nitrite: NEGATIVE
Protein, ur: NEGATIVE mg/dL
SPECIFIC GRAVITY, URINE: 1.013 (ref 1.005–1.030)
pH: 7 (ref 5.0–8.0)

## 2018-08-21 LAB — COMPREHENSIVE METABOLIC PANEL
ALBUMIN: 3.3 g/dL — AB (ref 3.5–5.0)
ALK PHOS: 97 U/L (ref 38–126)
ALT: 14 U/L (ref 0–44)
AST: 13 U/L — AB (ref 15–41)
Anion gap: 10 (ref 5–15)
BUN: 58 mg/dL — ABNORMAL HIGH (ref 8–23)
CALCIUM: 9 mg/dL (ref 8.9–10.3)
CO2: 25 mmol/L (ref 22–32)
CREATININE: 1.83 mg/dL — AB (ref 0.44–1.00)
Chloride: 97 mmol/L — ABNORMAL LOW (ref 98–111)
GFR calc Af Amer: 31 mL/min — ABNORMAL LOW (ref >60)
GFR, EST NON AFRICAN AMERICAN: 27 mL/min — AB (ref >60)
GLUCOSE: 951 mg/dL — AB (ref 70–99)
Potassium: 4.7 mmol/L (ref 3.5–5.1)
Sodium: 132 mmol/L — ABNORMAL LOW (ref 135–145)
Total Bilirubin: 0.7 mg/dL (ref 0.3–1.2)
Total Protein: 7.1 g/dL (ref 6.5–8.1)

## 2018-08-21 LAB — CBC WITH DIFFERENTIAL/PLATELET
BASOS ABS: 0.1 10*3/uL (ref 0.0–0.1)
BASOS PCT: 1 %
EOS ABS: 0.1 10*3/uL (ref 0.0–0.7)
EOS PCT: 1 %
HCT: 36.2 % (ref 36.0–46.0)
Hemoglobin: 11.6 g/dL — ABNORMAL LOW (ref 12.0–15.0)
LYMPHS PCT: 17 %
Lymphs Abs: 1 10*3/uL (ref 0.7–4.0)
MCH: 28.4 pg (ref 26.0–34.0)
MCHC: 32 g/dL (ref 30.0–36.0)
MCV: 88.5 fL (ref 78.0–100.0)
MONO ABS: 0.5 10*3/uL (ref 0.1–1.0)
Monocytes Relative: 8 %
Neutro Abs: 4.5 10*3/uL (ref 1.7–7.7)
Neutrophils Relative %: 73 %
PLATELETS: 250 10*3/uL (ref 150–400)
RBC: 4.09 MIL/uL (ref 3.87–5.11)
RDW: 14.2 % (ref 11.5–15.5)
WBC: 6.1 10*3/uL (ref 4.0–10.5)

## 2018-08-21 LAB — CBG MONITORING, ED: Glucose-Capillary: >600 mg/dL (ref 70–99)

## 2018-08-21 MED ORDER — SODIUM CHLORIDE 0.9 % IV BOLUS
1000.0000 mL | Freq: Once | INTRAVENOUS | Status: AC
  Administered 2018-08-21: 1000 mL via INTRAVENOUS

## 2018-08-21 MED ORDER — INSULIN REGULAR HUMAN 100 UNIT/ML IJ SOLN
INTRAMUSCULAR | Status: DC
  Filled 2018-08-21: qty 1

## 2018-08-21 MED ORDER — INSULIN ASPART 100 UNIT/ML IV SOLN
4.0000 [IU] | Freq: Once | INTRAVENOUS | Status: AC
  Administered 2018-08-21: 4 [IU] via INTRAVENOUS
  Filled 2018-08-21: qty 0.04

## 2018-08-21 MED ORDER — SODIUM CHLORIDE 0.9 % IV SOLN: INTRAVENOUS | Status: DC | PRN

## 2018-08-21 MED ORDER — INSULIN ASPART 100 UNIT/ML IV SOLN
10.0000 [IU] | Freq: Once | INTRAVENOUS | Status: AC
  Administered 2018-08-21: 10 [IU] via INTRAVENOUS
  Filled 2018-08-21: qty 0.1

## 2018-08-21 MED ORDER — INSULIN ASPART 100 UNIT/ML IV SOLN
5.0000 [IU] | Freq: Once | INTRAVENOUS | Status: DC
  Filled 2018-08-21: qty 0.05

## 2018-08-21 NOTE — ED Notes (Addendum)
Date and time results received: 08/21/18 2142 (use smartphrase ".now" to insert current time)  Test: Glucose Critical Value: 951  Name of Provider Notified: Mendel Ryder PA  Orders Received? Or Actions Taken?: Action taken

## 2018-08-21 NOTE — ED Notes (Signed)
Pt is aware a urine sample is needed but is unable to provide one at this time. Pt is on purewick.

## 2018-08-21 NOTE — ED Notes (Signed)
Purewick failed to collect urine due to malfunction in suction vacuum. Placed purewick on working suction vacuum. Pt is aware a urine sample is still needed. Pt's bed was changed and Pt provided with fresh blankets.

## 2018-08-21 NOTE — ED Provider Notes (Signed)
[place] DEPT Provider Note   CSN: [id number] Arrival date & time: 08/21/18  1841     History   Chief Complaint Chief Complaint  Patient presents with  . Hyperglycemia  . abnormal labs    HPI Angela Hays is a 67 y.o. female past medical history is of diabetes, hypertension, CVA with left-sided deficit who presents for evaluation of high blood sugars.  Patient reports that she lives at home with her son.  They report that she checked her blood sugar today and it read greater than 600.  She states that she has been taking her medications.  Patient states that she has been eating and probably over the last few weeks.  She states that her son has bought her apple pies and peach cobblers which she states she eats frequently.  Patient states that her blood sugar normally runs high but she has not had anything greater than 600 in a while.  She does not know a specific number of which has been running.  Patient denies any fevers, abdominal pain, nausea/vomiting, difficulty breathing, chest pain.   The history is provided by the patient.    Past Medical History:  Diagnosis Date  . Diabetes mellitus without complication ([place])   . Hypertension   . Stroke [facility])     Patient Active Problem List   Diagnosis Date Noted  . ARF (acute renal failure) ([place]) 05/11/2016  . Leg ulcer ([place]) 05/11/2016  . Cellulitis and abscess of leg 05/06/2016  . Sepsis ([place]) 01/05/2016  . Pressure ulcer 01/05/2016  . Leg swelling 01/05/2016  . DM (diabetes mellitus), type 2 with peripheral vascular complications ([place]) 40/81/4481  . Hypertensive urgency 01/05/2016    History reviewed. No pertinent surgical history.   OB History   None      Home Medications    Prior to Admission medications   Medication Sig Start Date End Date Taking? Authorizing Provider  amLODipine (NORVASC) 10 MG tablet Take 5 mg by mouth daily. 08/07/18  Yes [provider]  cloNIDine  (CATAPRES) 0.2 MG tablet Take 0.2 mg by mouth 4 (four) times daily. 08/07/18  Yes [provider]  clopidogrel (PLAVIX) 75 MG tablet Take 75 mg by mouth daily.   Yes [provider]  doxazosin (CARDURA) 8 MG tablet Take 4 mg by mouth at bedtime.    Yes [provider]  furosemide (LASIX) 40 MG tablet Take 0.5 tablets (20 mg total) by mouth daily as needed for fluid or edema (for weight gain of more then 2 Lbs in 1 day). Patient taking differently: Take 80 mg by mouth daily as needed for fluid or edema.  05/15/16  Yes Lavina Hamman, MD  Insulin Glargine (BASAGLAR KWIKPEN) 100 UNIT/ML SOPN Inject 15 Units into the skin every evening.  07/09/18  Yes [provider]  KLOR-CON M20 20 MEQ tablet Take 20 mEq by mouth daily. 05/24/18  Yes [provider]  labetalol (NORMODYNE) 100 MG tablet Take 100 mg by mouth 2 (two) times daily. 06/06/18  Yes [provider]  simvastatin (ZOCOR) 20 MG tablet Take 20 mg by mouth daily with breakfast.   Yes [provider]  labetalol (NORMODYNE) 200 MG tablet Take 0.5 tablets (100 mg total) by mouth 2 (two) times daily. Patient not taking: Reported on 08/21/2018 05/15/16   Lavina Hamman, MD  lactulose (CHRONULAC) 10 GM/15ML solution Take 30 mLs (20 g total) by mouth 2 (two) times daily as needed for  mild constipation. Patient not taking: Reported on 08/21/2018 05/15/16   Lavina Hamman, MD    Family History History reviewed. No pertinent family history.  Social History Social History   Tobacco Use  . Smoking status: Former Research scientist (life sciences)  . Smokeless tobacco: Never Used  Substance Use Topics  . Alcohol use: No  . Drug use: No     Allergies   Patient has no known allergies.   Review of Systems Review of Systems  Constitutional: Negative for fever.  Respiratory: Negative for cough and shortness of breath.   Cardiovascular: Negative for chest pain.  Gastrointestinal: Negative for abdominal pain, nausea  and vomiting.  Genitourinary: Negative for dysuria and hematuria.  Neurological: Negative for headaches.  All other systems reviewed and are negative.    Physical Exam Updated Vital Signs BP 125/66 (BP Location: Right Arm)   Pulse 77   Temp 98.2 F (36.8 C) (Oral)   Resp (!) 21   Ht 5\' 3"  (1.6 m)   Wt 66.8 kg   SpO2 100%   BMI 26.11 kg/m   Physical Exam  Constitutional: She is oriented to person, place, and time. She appears well-developed and well-nourished.  HENT:  Head: Normocephalic and atraumatic.  Mouth/Throat: Oropharynx is clear and moist and mucous membranes are normal.  Eyes: Pupils are equal, round, and reactive to light. Conjunctivae, EOM and lids are normal.  Neck: Full passive range of motion without pain.  Cardiovascular: Normal rate, regular rhythm, normal heart sounds and normal pulses. Exam reveals no gallop and no friction rub.  No murmur heard. Pulmonary/Chest: Effort normal and breath sounds normal.  Lungs clear to auscultation bilaterally.  Symmetric chest rise.  No wheezing, rales, rhonchi.  Abdominal: Soft. Normal appearance. There is no tenderness. There is no rigidity and no guarding.  Abdomen is soft, non-distended, non-tender. No rigidity, No guarding. No peritoneal signs.  Musculoskeletal: Normal range of motion.  Neurological: She is alert and oriented to person, place, and time.  Slight left-sided facial droop with patient states is residual from previous CVA.  Skin: Skin is warm and dry. Capillary refill takes less than 2 seconds.     Psychiatric: She has a normal mood and affect. Her speech is normal.  Nursing note and vitals reviewed.    ED Treatments / Results  Labs (all labs ordered are listed, but only abnormal results are displayed) Labs Reviewed  COMPREHENSIVE METABOLIC PANEL - Abnormal; Notable for the following components:      Result Value   Sodium 132 (*)    Chloride 97 (*)    Glucose, Bld 951 (*)    BUN 58 (*)     Creatinine, Ser 1.83 (*)    Albumin 3.3 (*)    AST 13 (*)    GFR calc non Af Amer 27 (*)    GFR calc Af Amer 31 (*)    All other components within normal limits  CBC WITH DIFFERENTIAL/PLATELET - Abnormal; Notable for the following components:   Hemoglobin 11.6 (*)    All other components within normal limits  URINALYSIS, ROUTINE W REFLEX MICROSCOPIC - Abnormal; Notable for the following components:   Color, Urine COLORLESS (*)    Glucose, UA >=500 (*)    All other components within normal limits  BASIC METABOLIC PANEL - Abnormal; Notable for the following components:   Potassium 3.1 (*)    Chloride 114 (*)    CO2 21 (*)    Glucose, Bld 571 (*)    BUN  48 (*)    Creatinine, Ser 1.46 (*)    Calcium 7.5 (*)    GFR calc non Af Amer 36 (*)    GFR calc Af Amer 41 (*)    All other components within normal limits  CBG MONITORING, ED - Abnormal; Notable for the following components:   Glucose-Capillary >600 (*)    All other components within normal limits  CBG MONITORING, ED - Abnormal; Notable for the following components:   Glucose-Capillary >600 (*)    All other components within normal limits  I-STAT CHEM 8, ED    EKG None  Radiology No results found.  Procedures Procedures (including critical care time)  Medications Ordered in ED Medications  0.9 %  sodium chloride infusion (has no administration in time range)  sodium chloride 0.9 % bolus 1,000 mL (1,000 mLs Intravenous New Bag/Given 08/21/18 2357)  insulin aspart (novoLOG) injection 10 Units (10 Units Intravenous Given 08/21/18 2223)  sodium chloride 0.9 % bolus 1,000 mL (0 mLs Intravenous Stopped 08/21/18 2358)  insulin aspart (novoLOG) injection 4 Units (4 Units Intravenous Given 08/21/18 2338)     Initial Impression / Assessment and Plan / ED Course  I have reviewed the triage vital signs and the nursing notes.  Pertinent labs & imaging results that were available during my care of the patient were reviewed by me  and considered in my medical decision making (see chart for details).  Clinical Course as of Aug 22 24  Tue Aug 21, 2977  4374 68 year old female with great blood sugar   [MB]    Clinical Course User Index [MB] Hayden Rasmussen, MD   67 year old female with past medical history of diabetes, CVA who presents for evaluation of hyperglycemia.  Reports she took it at home is greater than 600.  Has been taking medications.  No fevers, abdominal pain, nausea/vomiting. Patient is afebrile, non-toxic appearing, sitting comfortably on examination table. Vital signs reviewed and stable.  On exam, abdomen is soft, nondistended.  Consider hyperglycemia versus DKA.  Initial labs ordered at triage.  CMP shows glucose of 951.  Bicarb is normal.  Anion gap is normal. Creatine  is slightly elevated at 1.83.  She has had elevations of creatinine in the past.  Her most recent one was 2 years ago her is 0.78.  CBC is without any significant leukocytosis or anemia.  UA shows no ketones.  At this time, given normal bicarb, normal anion gap, absence of ketones in urine, do not suspect DKA but patient still very hyperglycemic.  She may need further evaluation by insulin drip and admission.  Discussed with hospitalist.  They recommend attempting to lower her blood sugar in the ED after fluids, IV insulin rather than starting her on insulin drip.  They would like to see if her blood sugar improves in the ED rather than admitting her.  Patient given 10 units of IV insulin.  Reevaluation after 10 units of IV insulin, patient still with no complaints.  Repeat BMP shows BUN of 48, creatinine of 1.46.  This is slightly improved after fluids.  Additionally, her blood sugar improved to 571.  Will give additional 4 units of insulin and recheck her i-STAT Chem-8.   Patient signed out to [facility], PA-C pending repeat i-STAT Chem-8, repeat blood sugar.  If blood sugar and creatinine are improved and patient is stable, plan for  discharge home.  Final Clinical Impressions(s) / ED Diagnoses   Final diagnoses:  Hyperglycemia    ED  Discharge Orders    None       Desma Mcgregor 08/22/18 0025    Hayden Rasmussen, MD 08/22/18 1537

## 2018-08-21 NOTE — ED Triage Notes (Signed)
Per EMS: Pt came from home.  PCP notified pt of abnormal labs and recommended she be seen at the ED.  Pt's blood sugar was over 600 on arrival. Pt has skin- breakdown on bottom due to being wheelchair bound.  Pt A&O at baseline, pt hx of CVA with L sided weakness.  Pt non-ambulatory.

## 2018-08-21 NOTE — ED Notes (Addendum)
Date and time results received: 08/21/18 31594 (use smartphrase ".now" to insert current time)  Test: Glucose Critical Value: 571  Name of Provider Notified: Mendel Ryder PA  Orders Received? Or Actions Taken?: waiting on orders

## 2018-08-22 LAB — BASIC METABOLIC PANEL
Anion gap: 9 (ref 5–15)
Anion gap: 12 (ref 5–15)
BUN: 48 mg/dL — ABNORMAL HIGH (ref 8–23)
BUN: 53 mg/dL — ABNORMAL HIGH (ref 8–23)
CO2: 21 mmol/L — AB (ref 22–32)
CO2: 22 mmol/L (ref 22–32)
CREATININE: 1.46 mg/dL — AB (ref 0.44–1.00)
CREATININE: 1.5 mg/dL — AB (ref 0.44–1.00)
Calcium: 7.5 mg/dL — ABNORMAL LOW (ref 8.9–10.3)
Calcium: 8.8 mg/dL — ABNORMAL LOW (ref 8.9–10.3)
Chloride: 114 mmol/L — ABNORMAL HIGH (ref 98–111)
Chloride: 110 mmol/L (ref 98–111)
GFR calc Af Amer: 40 mL/min — ABNORMAL LOW (ref >60)
GFR calc non Af Amer: 34 mL/min — ABNORMAL LOW (ref >60)
GFR, EST AFRICAN AMERICAN: 41 mL/min — AB (ref >60)
GFR, EST NON AFRICAN AMERICAN: 36 mL/min — AB (ref >60)
GLUCOSE: 571 mg/dL — AB (ref 70–99)
GLUCOSE: 197 mg/dL — AB (ref 70–99)
Potassium: 3.1 mmol/L — ABNORMAL LOW (ref 3.5–5.1)
Potassium: 3.9 mmol/L (ref 3.5–5.1)
Sodium: 144 mmol/L (ref 135–145)
Sodium: 144 mmol/L (ref 135–145)

## 2018-08-22 NOTE — Discharge Instructions (Signed)
Continue your daily insulin.  Maintain good dietary choices and limit your intake of sugary and processed foods.  Have your blood sugar rechecked by your primary care doctor.  You may return for any new or concerning symptoms.

## 2018-08-22 NOTE — ED Provider Notes (Signed)
2:15 AM Patient care assumed from [facility], PA-C at change of shift.  Patient presenting for uncomplicated hyperglycemia.  Her sugar has improved to 197 with fluids as well as IV insulin.  Her work-up in the emergency department has been reassuring.  No ketonuria or increased anion gap.  Patient presently appropriate for discharge.  I have contacted her son, [name], who will pick the patient up from the department.  Patient expresses comfort with discharge.  Instructed to return for new or concerning symptoms.   Vitals:   08/22/18 0030 08/22/18 0100 08/22/18 0130 08/22/18 0200  BP: (!) 142/72 137/67 (!) 151/90 133/64  Pulse: 70 69 71 69  Resp: 20 20 (!) 21 20  Temp:      TempSrc:      SpO2: 100% 100% 100% 98%  Weight:      Height:          Antonietta Breach, PA-C 08/22/18 0217    M[place], Jenny Reichmann, MD 08/22/18 [phone]

## 2019-06-16 ENCOUNTER — Inpatient Hospital Stay (HOSPITAL_COMMUNITY)
Admission: EM | Admit: 2019-06-16 | Discharge: 2019-06-22 | DRG: 640 | Disposition: A | Discharge status: Home Health | Payer: Medicare Other | Attending: Internal Medicine | Admitting: Internal Medicine

## 2019-06-16 ENCOUNTER — Emergency Department (HOSPITAL_COMMUNITY): Payer: Medicare Other

## 2019-06-16 ENCOUNTER — Other Ambulatory Visit: Payer: Self-pay

## 2019-06-16 ENCOUNTER — Encounter (HOSPITAL_COMMUNITY): Payer: Self-pay | Admitting: Family Medicine

## 2019-06-16 DIAGNOSIS — I130 Hypertensive heart and chronic kidney disease with heart failure and stage 1 through stage 4 chronic kidney disease, or unspecified chronic kidney disease: Secondary | ICD-10-CM | POA: Diagnosis present

## 2019-06-16 DIAGNOSIS — I9589 Other hypotension: Secondary | ICD-10-CM

## 2019-06-16 DIAGNOSIS — I248 Other forms of acute ischemic heart disease: Secondary | ICD-10-CM | POA: Diagnosis present

## 2019-06-16 DIAGNOSIS — Z6823 Body mass index (BMI) 23.0-23.9, adult: Secondary | ICD-10-CM

## 2019-06-16 DIAGNOSIS — R627 Adult failure to thrive: Secondary | ICD-10-CM | POA: Diagnosis not present

## 2019-06-16 DIAGNOSIS — E46 Unspecified protein-calorie malnutrition: Secondary | ICD-10-CM | POA: Diagnosis present

## 2019-06-16 DIAGNOSIS — I69398 Other sequelae of cerebral infarction: Secondary | ICD-10-CM

## 2019-06-16 DIAGNOSIS — D751 Secondary polycythemia: Secondary | ICD-10-CM | POA: Diagnosis present

## 2019-06-16 DIAGNOSIS — E1122 Type 2 diabetes mellitus with diabetic chronic kidney disease: Secondary | ICD-10-CM | POA: Diagnosis present

## 2019-06-16 DIAGNOSIS — Z794 Long term (current) use of insulin: Secondary | ICD-10-CM

## 2019-06-16 DIAGNOSIS — I493 Ventricular premature depolarization: Secondary | ICD-10-CM | POA: Diagnosis present

## 2019-06-16 DIAGNOSIS — L304 Erythema intertrigo: Secondary | ICD-10-CM | POA: Diagnosis present

## 2019-06-16 DIAGNOSIS — Z515 Encounter for palliative care: Secondary | ICD-10-CM

## 2019-06-16 DIAGNOSIS — N179 Acute kidney failure, unspecified: Secondary | ICD-10-CM | POA: Diagnosis present

## 2019-06-16 DIAGNOSIS — R7989 Other specified abnormal findings of blood chemistry: Secondary | ICD-10-CM

## 2019-06-16 DIAGNOSIS — Z7902 Long term (current) use of antithrombotics/antiplatelets: Secondary | ICD-10-CM

## 2019-06-16 DIAGNOSIS — E871 Hypo-osmolality and hyponatremia: Secondary | ICD-10-CM | POA: Diagnosis not present

## 2019-06-16 DIAGNOSIS — E11649 Type 2 diabetes mellitus with hypoglycemia without coma: Secondary | ICD-10-CM | POA: Diagnosis present

## 2019-06-16 DIAGNOSIS — Z1159 Encounter for screening for other viral diseases: Secondary | ICD-10-CM

## 2019-06-16 DIAGNOSIS — E861 Hypovolemia: Secondary | ICD-10-CM | POA: Diagnosis present

## 2019-06-16 DIAGNOSIS — I5032 Chronic diastolic (congestive) heart failure: Secondary | ICD-10-CM | POA: Diagnosis present

## 2019-06-16 DIAGNOSIS — B372 Candidiasis of skin and nail: Secondary | ICD-10-CM | POA: Diagnosis not present

## 2019-06-16 DIAGNOSIS — R531 Weakness: Secondary | ICD-10-CM

## 2019-06-16 DIAGNOSIS — Z79899 Other long term (current) drug therapy: Secondary | ICD-10-CM

## 2019-06-16 DIAGNOSIS — Z7189 Other specified counseling: Secondary | ICD-10-CM

## 2019-06-16 DIAGNOSIS — Z993 Dependence on wheelchair: Secondary | ICD-10-CM

## 2019-06-16 DIAGNOSIS — R54 Age-related physical debility: Secondary | ICD-10-CM | POA: Diagnosis present

## 2019-06-16 DIAGNOSIS — L8990 Pressure ulcer of unspecified site, unspecified stage: Secondary | ICD-10-CM | POA: Insufficient documentation

## 2019-06-16 DIAGNOSIS — I69354 Hemiplegia and hemiparesis following cerebral infarction affecting left non-dominant side: Secondary | ICD-10-CM

## 2019-06-16 DIAGNOSIS — E8770 Fluid overload, unspecified: Secondary | ICD-10-CM

## 2019-06-16 DIAGNOSIS — G9341 Metabolic encephalopathy: Secondary | ICD-10-CM | POA: Diagnosis present

## 2019-06-16 DIAGNOSIS — H547 Unspecified visual loss: Secondary | ICD-10-CM | POA: Diagnosis present

## 2019-06-16 DIAGNOSIS — E1151 Type 2 diabetes mellitus with diabetic peripheral angiopathy without gangrene: Secondary | ICD-10-CM | POA: Diagnosis not present

## 2019-06-16 DIAGNOSIS — Z87891 Personal history of nicotine dependence: Secondary | ICD-10-CM

## 2019-06-16 DIAGNOSIS — R778 Other specified abnormalities of plasma proteins: Secondary | ICD-10-CM | POA: Diagnosis present

## 2019-06-16 DIAGNOSIS — E872 Acidosis, unspecified: Secondary | ICD-10-CM | POA: Diagnosis present

## 2019-06-16 DIAGNOSIS — N183 Chronic kidney disease, stage 3 (moderate): Secondary | ICD-10-CM | POA: Diagnosis present

## 2019-06-16 DIAGNOSIS — E875 Hyperkalemia: Secondary | ICD-10-CM

## 2019-06-16 DIAGNOSIS — N289 Disorder of kidney and ureter, unspecified: Secondary | ICD-10-CM

## 2019-06-16 DIAGNOSIS — E860 Dehydration: Secondary | ICD-10-CM

## 2019-06-16 DIAGNOSIS — H9190 Unspecified hearing loss, unspecified ear: Secondary | ICD-10-CM | POA: Diagnosis present

## 2019-06-16 DIAGNOSIS — E876 Hypokalemia: Secondary | ICD-10-CM | POA: Diagnosis not present

## 2019-06-16 LAB — CREATININE, URINE, RANDOM: Creatinine, Urine: 26.34 mg/dL

## 2019-06-16 LAB — COMPREHENSIVE METABOLIC PANEL
ALT: 17 U/L (ref 0–44)
AST: 22 U/L (ref 15–41)
Albumin: 3.3 g/dL — ABNORMAL LOW (ref 3.5–5.0)
Alkaline Phosphatase: 92 U/L (ref 38–126)
Anion gap: 12 (ref 5–15)
BUN: 113 mg/dL — ABNORMAL HIGH (ref 8–23)
CO2: 13 mmol/L — ABNORMAL LOW (ref 22–32)
Calcium: 8.3 mg/dL — ABNORMAL LOW (ref 8.9–10.3)
Chloride: 107 mmol/L (ref 98–111)
Creatinine, Ser: 2.24 mg/dL — ABNORMAL HIGH (ref 0.44–1.00)
GFR calc Af Amer: 25 mL/min — ABNORMAL LOW (ref >60)
GFR calc non Af Amer: 22 mL/min — ABNORMAL LOW (ref >60)
Glucose, Bld: 62 mg/dL — ABNORMAL LOW (ref 70–99)
Potassium: 7.1 mmol/L (ref 3.5–5.1)
Sodium: 132 mmol/L — ABNORMAL LOW (ref 135–145)
Total Bilirubin: 0.7 mg/dL (ref 0.3–1.2)
Total Protein: 6.5 g/dL (ref 6.5–8.1)

## 2019-06-16 LAB — URINALYSIS, ROUTINE W REFLEX MICROSCOPIC
Bilirubin Urine: NEGATIVE
Glucose, UA: 50 mg/dL — AB
Hgb urine dipstick: NEGATIVE
Ketones, ur: NEGATIVE mg/dL
Nitrite: NEGATIVE
Protein, ur: NEGATIVE mg/dL
Specific Gravity, Urine: 1.01 (ref 1.005–1.030)
pH: 5 (ref 5.0–8.0)

## 2019-06-16 LAB — CBC WITH DIFFERENTIAL/PLATELET
Abs Immature Granulocytes: 0.11 10*3/uL — ABNORMAL HIGH (ref 0.00–0.07)
Basophils Absolute: 0 10*3/uL (ref 0.0–0.1)
Basophils Relative: 0 %
Eosinophils Absolute: 0 10*3/uL (ref 0.0–0.5)
Eosinophils Relative: 0 %
HCT: 49.4 % — ABNORMAL HIGH (ref 36.0–46.0)
Hemoglobin: 16.1 g/dL — ABNORMAL HIGH (ref 12.0–15.0)
Immature Granulocytes: 1 %
Lymphocytes Relative: 4 %
Lymphs Abs: 0.7 10*3/uL (ref 0.7–4.0)
MCH: 28.9 pg (ref 26.0–34.0)
MCHC: 32.6 g/dL (ref 30.0–36.0)
MCV: 88.5 fL (ref 80.0–100.0)
Monocytes Absolute: 1.1 10*3/uL — ABNORMAL HIGH (ref 0.1–1.0)
Monocytes Relative: 6 %
Neutro Abs: 16.9 10*3/uL — ABNORMAL HIGH (ref 1.7–7.7)
Neutrophils Relative %: 89 %
Platelets: 301 10*3/uL (ref 150–400)
RBC: 5.58 MIL/uL — ABNORMAL HIGH (ref 3.87–5.11)
RDW: 14.8 % (ref 11.5–15.5)
WBC: 18.9 10*3/uL — ABNORMAL HIGH (ref 4.0–10.5)
nRBC: 0 % (ref 0.0–0.2)

## 2019-06-16 LAB — CBG MONITORING, ED
Glucose-Capillary: 70 mg/dL (ref 70–99)
Glucose-Capillary: 83 mg/dL (ref 70–99)
Glucose-Capillary: 185 mg/dL — ABNORMAL HIGH (ref 70–99)

## 2019-06-16 LAB — TROPONIN I
Troponin I: 0.08 ng/mL (ref <0.03)
Troponin I: 0.08 ng/mL (ref <0.03)

## 2019-06-16 LAB — BASIC METABOLIC PANEL
Anion gap: 12 (ref 5–15)
BUN: 136 mg/dL — ABNORMAL HIGH (ref 8–23)
CO2: 16 mmol/L — ABNORMAL LOW (ref 22–32)
Calcium: 9.6 mg/dL (ref 8.9–10.3)
Chloride: 103 mmol/L (ref 98–111)
Creatinine, Ser: 2.06 mg/dL — ABNORMAL HIGH (ref 0.44–1.00)
GFR calc Af Amer: 28 mL/min — ABNORMAL LOW (ref >60)
GFR calc non Af Amer: 24 mL/min — ABNORMAL LOW (ref >60)
Glucose, Bld: 205 mg/dL — ABNORMAL HIGH (ref 70–99)
Potassium: 5.9 mmol/L — ABNORMAL HIGH (ref 3.5–5.1)
Sodium: 131 mmol/L — ABNORMAL LOW (ref 135–145)

## 2019-06-16 LAB — SODIUM, URINE, RANDOM: Sodium, Ur: 46 mmol/L

## 2019-06-16 LAB — LIPASE, BLOOD: Lipase: 34 U/L (ref 11–51)

## 2019-06-16 LAB — BRAIN NATRIURETIC PEPTIDE: B Natriuretic Peptide: 149.9 pg/mL — ABNORMAL HIGH (ref 0.0–100.0)

## 2019-06-16 LAB — CK: Total CK: 172 U/L (ref 38–234)

## 2019-06-16 MED ORDER — HYDROCODONE-ACETAMINOPHEN 5-325 MG PO TABS
1.0000 | ORAL_TABLET | Freq: Three times a day (TID) | ORAL | Status: DC | PRN
  Filled 2019-06-16 (×2): qty 1

## 2019-06-16 MED ORDER — SODIUM CHLORIDE 0.9 % IV SOLN
1.0000 g | Freq: Once | INTRAVENOUS | Status: AC
  Administered 2019-06-16: 1 g via INTRAVENOUS
  Filled 2019-06-16: qty 10

## 2019-06-16 MED ORDER — ALBUTEROL SULFATE HFA 108 (90 BASE) MCG/ACT IN AERS
2.0000 | INHALATION_SPRAY | Freq: Once | RESPIRATORY_TRACT | Status: AC
  Administered 2019-06-16: 2 via RESPIRATORY_TRACT
  Filled 2019-06-16: qty 6.7

## 2019-06-16 MED ORDER — INSULIN ASPART 100 UNIT/ML IV SOLN
5.0000 [IU] | Freq: Once | INTRAVENOUS | Status: AC
  Administered 2019-06-16: 5 [IU] via INTRAVENOUS
  Filled 2019-06-16: qty 0.05

## 2019-06-16 MED ORDER — SODIUM CHLORIDE 0.9 % IV BOLUS
1000.0000 mL | Freq: Once | INTRAVENOUS | Status: AC
  Administered 2019-06-16: 1000 mL via INTRAVENOUS

## 2019-06-16 MED ORDER — CLOTRIMAZOLE 1 % EX CREA
TOPICAL_CREAM | Freq: Two times a day (BID) | CUTANEOUS | Status: DC
  Administered 2019-06-17 (×2): 1 via TOPICAL
  Administered 2019-06-17: 20:00:00 via TOPICAL
  Administered 2019-06-18: 1 via TOPICAL
  Administered 2019-06-18 – 2019-06-22 (×8): via TOPICAL
  Filled 2019-06-16: qty 15

## 2019-06-16 MED ORDER — FENTANYL CITRATE (PF) 100 MCG/2ML IJ SOLN
25.0000 ug | INTRAMUSCULAR | Status: DC | PRN
  Administered 2019-06-16: 0.5 ug via INTRAVENOUS
  Filled 2019-06-16 (×2): qty 2

## 2019-06-16 MED ORDER — SODIUM BICARBONATE 8.4 % IV SOLN
INTRAVENOUS | Status: DC
  Administered 2019-06-16: 21:00:00 via INTRAVENOUS
  Filled 2019-06-16 (×2): qty 150

## 2019-06-16 MED ORDER — ALBUTEROL SULFATE (2.5 MG/3ML) 0.083% IN NEBU
10.0000 mg | INHALATION_SOLUTION | Freq: Once | RESPIRATORY_TRACT | Status: DC
  Filled 2019-06-16: qty 12

## 2019-06-16 MED ORDER — DEXTROSE 50 % IV SOLN
1.0000 | Freq: Once | INTRAVENOUS | Status: AC
  Administered 2019-06-16: 50 mL via INTRAVENOUS
  Filled 2019-06-16: qty 50

## 2019-06-16 NOTE — ED Notes (Signed)
Date and time results received: 06/16/19 7:10 PM  (use smartphrase ".now" to insert current time)  Test: potassium 7.1, troponin 0.08 Critical Value: Name of Provider Notified:   Orders Received? Or Actions Taken?:

## 2019-06-16 NOTE — ED Notes (Signed)
Please contact Gwenyth Dingee [phone]) with update about patient

## 2019-06-16 NOTE — Progress Notes (Signed)
Spoke with RN regarding Albuterol orders for CAT and MDI.  CAT order will be DC for now and RN will administer MDI.  RN will call this writer in the event orders change.

## 2019-06-16 NOTE — ED Provider Notes (Signed)
[place] DEPT Provider Note   CSN: [id number] Arrival date & time: 06/16/19  1340     History   Chief Complaint Chief Complaint  Patient presents with  . Failure To Thrive    HPI Angela Hays is a 68 y.o. female.     HPI Patient presents from home with family concern of failure to thrive. The patient herself is intermittently interactive, denies discomfort, requests food, but cannot specify why she is here. Per report EMS states that family states that the patient has not been eating or drinking sufficiently, but has been provided her medication in a regular fashion. Time course is unclear. Level 5 caveat secondary to acuity of condition. On my initial evaluation the patient is yelping, writhing, as staff is cleaning her perineum and waist area which is grossly excoriated, erythematous. Past Medical History:  Diagnosis Date  . Diabetes mellitus without complication ([place])   . Hypertension   . Stroke [facility])     Patient Active Problem List   Diagnosis Date Noted  . ARF (acute renal failure) ([place]) 05/11/2016  . Leg ulcer ([place]) 05/11/2016  . Cellulitis and abscess of leg 05/06/2016  . Sepsis ([place]) 01/05/2016  . Pressure ulcer 01/05/2016  . Leg swelling 01/05/2016  . DM (diabetes mellitus), type 2 with peripheral vascular complications ([place]) 96/22/2979  . Hypertensive urgency 01/05/2016    No past surgical history on file.   OB History   No obstetric history on file.      Home Medications    Prior to Admission medications   Medication Sig Start Date End Date Taking? Authorizing Provider  amLODipine (NORVASC) 10 MG tablet Take 5 mg by mouth daily. 08/07/18   [provider]  cloNIDine (CATAPRES) 0.2 MG tablet Take 0.2 mg by mouth 4 (four) times daily. 08/07/18   [provider]  clopidogrel (PLAVIX) 75 MG tablet Take 75 mg by mouth daily.    [provider]  doxazosin (CARDURA) 8 MG tablet Take 4 mg by  mouth at bedtime.     [provider]  furosemide (LASIX) 40 MG tablet Take 0.5 tablets (20 mg total) by mouth daily as needed for fluid or edema (for weight gain of more then 2 Lbs in 1 day). Patient taking differently: Take 80 mg by mouth daily as needed for fluid or edema.  05/15/16   Lavina Hamman, MD  Insulin Glargine (BASAGLAR KWIKPEN) 100 UNIT/ML SOPN Inject 15 Units into the skin every evening.  07/09/18   [provider]  KLOR-CON M20 20 MEQ tablet Take 20 mEq by mouth daily. 05/24/18   [provider]  labetalol (NORMODYNE) 100 MG tablet Take 100 mg by mouth 2 (two) times daily. 06/06/18   [provider]  labetalol (NORMODYNE) 200 MG tablet Take 0.5 tablets (100 mg total) by mouth 2 (two) times daily. Patient not taking: Reported on 08/21/2018 05/15/16   Lavina Hamman, MD  lactulose (CHRONULAC) 10 GM/15ML solution Take 30 mLs (20 g total) by mouth 2 (two) times daily as needed for mild constipation. Patient not taking: Reported on 08/21/2018 05/15/16   Lavina Hamman, MD  simvastatin (ZOCOR) 20 MG tablet Take 20 mg by mouth daily with breakfast.    [provider]    Family History No family history on file.  Social History Social History   Tobacco Use  . Smoking status: Former Research scientist (life sciences)  . Smokeless tobacco: Never Used  Substance Use Topics  . Alcohol  use: No  . Drug use: No     Allergies   Patient has no known allergies.   Review of Systems Review of Systems  Unable to perform ROS: Acuity of condition     Physical Exam Updated Vital Signs There were no vitals taken for this visit.  Physical Exam Vitals signs and nursing note reviewed.  Constitutional:      Appearance: She is well-developed. She is ill-appearing.  HENT:     Head: Normocephalic and atraumatic.  Eyes:     Conjunctiva/sclera: Conjunctivae normal.  Cardiovascular:     Rate and Rhythm: Normal rate and regular rhythm.  Pulmonary:     Effort: Pulmonary  effort is normal. No respiratory distress.     Breath sounds: Normal breath sounds. No stridor.  Chest:    Abdominal:     General: There is no distension.     Tenderness: There is no abdominal tenderness.  Skin:    General: Skin is warm.       Neurological:     Mental Status: She is alert and oriented to person, place, and time.     Cranial Nerves: No cranial nerve deficit.  Psychiatric:        Mood and Affect: Affect is labile.        Cognition and Memory: Cognition is impaired.      ED Treatments / Results  Labs (all labs ordered are listed, but only abnormal results are displayed) Labs Reviewed  COMPREHENSIVE METABOLIC PANEL  LIPASE, BLOOD  BRAIN NATRIURETIC PEPTIDE  TROPONIN I  CBC WITH DIFFERENTIAL/PLATELET  URINALYSIS, ROUTINE W REFLEX MICROSCOPIC  CBG MONITORING, ED    EKG    Radiology Dg Chest Port 1 View  Result Date: 06/16/2019 CLINICAL DATA:  Failure to thrive. EXAM: PORTABLE CHEST 1 VIEW COMPARISON:  05/07/2016 FINDINGS: Patient is moderately rotated to the left. Lungs are hypoinflated without focal airspace consolidation, effusion or pneumothorax. Cardiomediastinal silhouette and remainder of the exam is unchanged. IMPRESSION: Hypoinflation without acute cardiopulmonary disease. Electronically Signed   By: Marin Olp M.D.   On: 06/16/2019 14:31    Procedures Procedures (including critical care time)  Medications Ordered in ED Medications - No data to display   Initial Impression / Assessment and Plan / ED Course  I have reviewed the triage vital signs and the nursing notes.  Pertinent labs & imaging results that were available during my care of the patient were reviewed by me and considered in my medical decision making (see chart for details).   This elderly female presents from home with family concerns of failure to thrive, lack of oral intake. Initially the patient is anxious, with labile mood, though this may have been due to her initial  physical exam, evaluation. Patient's initial blood pressure low, systolic 70, but improved to 90 with half liter fluid resuscitation.  2:50 PM Patient awake and alert, denies complaints, is oriented appropriately, answers questions such as where you live, do feel safe at home appropriately, consistently.  This elderly female presents from home with reported family concern of failure to thrive Patient is initially agitated, labile mood, but this may be secondary to her initial resuscitation. Patient improved mentation wise, and that she has mild hypotension, is otherwise in no distress Patient has additional lab studies pending, was awake, alert, afebrile, and if her blood pressure improves, labs are unremarkable she may be appropriate for discharge. Dr. Melina Copa is aware of the patient and will facilitate disposition.   Final Clinical Impressions(s) /  ED Diagnoses   Final diagnoses:  Weakness     Carmin Muskrat, MD 06/16/19 1452

## 2019-06-16 NOTE — ED Notes (Signed)
Bed: BT24 Expected date:  Expected time:  Means of arrival:  Comments: Lethargic and hypotension.

## 2019-06-16 NOTE — H&P (Addendum)
History and Physical    Angela Hays PPJ:[id number] DOB: 23-Jan-1951 DOA: 06/16/2019  PCP: Maury Dus, MD   Patient coming from: Home   Chief Complaint: Not eating, more lethargic   HPI: Angela Hays is a 68 y.o. female with medical history significant for insulin-dependent diabetes, hypertension, and history of CVA, now presenting to the emergency department for evaluation of worsening lethargy, not eating, and hypotension.  Patient's family reports that the patient has been more lethargic over the past couple days, has not eaten anything in 2 days, but has continued to drink some fluids.  She has continued to take her medications as prescribed.  Patient has not complained of anything in particular per report of family.  At baseline, she is alert and talkative.  EMS was called out, patient was found to have systolic pressure in the 58K, and she was given 500 cc of IV fluids prior to arrival in the ED.  ED Course: Upon arrival to the ED, patient is found to be afebrile, saturating adequately on room air, and with blood pressure 90/47.  EKG features sinus rhythm with LVH and repolarization abnormality.  Chest x-ray is notable for hypoinflation without acute cardiopulmonary disease.  Noncontrast head CT reveals chronic infarctions but no acute intracranial abnormality.  Chemistry panel is concerning for potassium of 7.1, bicarbonate of 13, and creatinine 2.24 with unclear baseline but up from 1.50 a year ago.  CBC features a leukocytosis to 18,900 with a mild polycythemia.  BNP is slightly elevated 250 and troponin to 0.08.  Patient was treated with albuterol, IV calcium, insulin with dextrose, bicarbonate, and 1 L of normal saline in the ED.  Nephrology was consulted by the ED physician, suspects this is a prerenal injury and anticipates rapid improvement in the potassium, recommending bicarbonate infusion and medical admission.  Review of Systems:  Unable to complete ROS secondary to the  patient's clinical condition.  Past Medical History:  Diagnosis Date  . Diabetes mellitus without complication ([place])   . Hypertension   . Stroke [facility])     History reviewed. No pertinent surgical history.   reports that she has quit smoking. She has never used smokeless tobacco. She reports that she does not drink alcohol or use drugs.  No Known Allergies  History reviewed. No pertinent family history.   Prior to Admission medications   Medication Sig Start Date End Date Taking? Authorizing Provider  amLODipine (NORVASC) 5 MG tablet Take 5 mg by mouth every morning. 05/28/19  Yes [provider]  cloNIDine (CATAPRES) 0.2 MG tablet Take 0.2 mg by mouth 4 (four) times daily. 08/07/18  Yes [provider]  clopidogrel (PLAVIX) 75 MG tablet Take 75 mg by mouth daily.   Yes [provider]  doxazosin (CARDURA) 4 MG tablet Take 4 mg by mouth at bedtime. 05/17/19  Yes [provider]  furosemide (LASIX) 40 MG tablet Take 0.5 tablets (20 mg total) by mouth daily as needed for fluid or edema (for weight gain of more then 2 Lbs in 1 day). Patient taking differently: Take 80 mg by mouth daily.  05/15/16  Yes Lavina Hamman, MD  Insulin Glargine (BASAGLAR KWIKPEN) 100 UNIT/ML SOPN Inject 15 Units into the skin 2 (two) times daily.  07/09/18  Yes [provider]  KLOR-CON M20 20 MEQ tablet Take 20 mEq by mouth daily. 05/24/18  Yes [provider]  labetalol (NORMODYNE) 100 MG tablet Take 100 mg by mouth 2 (two) times daily. 06/06/18  Yes [provider]  simvastatin (ZOCOR) 20 MG tablet Take 20 mg by mouth daily with breakfast.   Yes [provider]  DUREZOL 0.05 % EMUL Place 1 drop into the right eye as directed. Instill 1 drop BID IN OPERATIVE EYE Starting 2 Days Prior to Surgery and After Surgery for 3 Weeks 06/04/19   [provider]  ofloxacin (OCUFLOX) 0.3 % ophthalmic solution Place 1 drop into the right eye as directed.  Instill 1 drop BID IN OPERATIVE EYE Starting 2 Days Prior to Surgery and After Surgery for 3 Weeks 06/04/19   [provider]    Physical Exam: Vitals:   06/16/19 2000 06/16/19 2015 06/16/19 2030 06/16/19 2045  BP: (!) 122/51 136/64 138/65 126/64  Pulse:   81 85  Resp: (!) 22 (!) 32 (!) 24 (!) 25  Temp:      TempSrc:      SpO2:   99% 100%    Constitutional: NAD, calm  Eyes: PERTLA, lids and conjunctivae normal ENMT: Mucous membranes are moist. Posterior pharynx clear of any exudate or lesions.   Neck: normal, supple, no masses, no thyromegaly Respiratory: clear to auscultation bilaterally, no wheezing, no crackles. No accessory muscle use.  Cardiovascular: S1 & S2 heard, regular rate and rhythm. No extremity edema.   Abdomen: No distension, no tenderness, soft. Bowel sounds active.  Musculoskeletal: no clubbing / cyanosis. Large soft, nontender, superficial mass near right clavicle.   Skin: Erythema with maceration, erosions, and excoriations involving the intertriginous areas and buttock. Warm, dry, well-perfused. Neurologic: No gross facial asymmetry. Sensation intact. Moving all extremities.  Psychiatric: Alert and oriented to person, place, and situation, but has some confusion and gives inconsistent answers when the same question is repeated. Cooperative.    Labs on Admission: I have personally reviewed following labs and imaging studies  CBC: Recent Labs  Lab 06/16/19 1458  WBC 18.9*  NEUTROABS 16.9*  HGB 16.1*  HCT 49.4*  MCV 88.5  PLT 440   Basic Metabolic Panel: Recent Labs  Lab 06/16/19 1747  NA 132*  K 7.1*  CL 107  CO2 13*  GLUCOSE 62*  BUN PENDING  CREATININE 2.24*  CALCIUM 8.3*   GFR: CrCl cannot be calculated (Unknown ideal weight.). Liver Function Tests: Recent Labs  Lab 06/16/19 1747  AST 22  ALT 17  ALKPHOS 92  BILITOT 0.7  PROT 6.5  ALBUMIN 3.3*   Recent Labs  Lab 06/16/19 1747  LIPASE 34   No results for input(s):  AMMONIA in the last 168 hours. Coagulation Profile: No results for input(s): INR, PROTIME in the last 168 hours. Cardiac Enzymes: Recent Labs  Lab 06/16/19 1747  TROPONINI 0.08*   BNP (last 3 results) No results for input(s): PROBNP in the last 8760 hours. HbA1C: No results for input(s): HGBA1C in the last 72 hours. CBG: Recent Labs  Lab 06/16/19 1359 06/16/19 1932 06/16/19 2036  GLUCAP 70 83 185*   Lipid Profile: No results for input(s): CHOL, HDL, LDLCALC, TRIG, CHOLHDL, LDLDIRECT in the last 72 hours. Thyroid Function Tests: No results for input(s): TSH, T4TOTAL, FREET4, T3FREE, THYROIDAB in the last 72 hours. Anemia Panel: No results for input(s): VITAMINB12, FOLATE, FERRITIN, TIBC, IRON, RETICCTPCT in the last 72 hours. Urine analysis:    Component Value Date/Time   COLORURINE COLORLESS (A) 08/21/2018 2218   APPEARANCEUR CLEAR 08/21/2018 2218   LABSPEC 1.013 08/21/2018 2218   PHURINE 7.0 08/21/2018 2218   GLUCOSEU >=500 (A) 08/21/2018 2218  HGBUR NEGATIVE 08/21/2018 2218   BILIRUBINUR NEGATIVE 08/21/2018 2218   KETONESUR NEGATIVE 08/21/2018 2218   PROTEINUR NEGATIVE 08/21/2018 2218   UROBILINOGEN 1.0 12/05/2007 1244   NITRITE NEGATIVE 08/21/2018 2218   LEUKOCYTESUR NEGATIVE 08/21/2018 2218   Sepsis Labs: @LABRCNTIP (procalcitonin:4,lacticidven:4) )No results found for this or any previous visit (from the past 240 hour(s)).   Radiological Exams on Admission: Ct Head Wo Contrast  Result Date: 06/16/2019 CLINICAL DATA:  Altered mental status. EXAM: CT HEAD WITHOUT CONTRAST TECHNIQUE: Contiguous axial images were obtained from the base of the skull through the vertex without intravenous contrast. COMPARISON:  05/10/2006 FINDINGS: Brain: No evidence of acute infarction, hemorrhage, hydrocephalus, extra-axial collection or mass lesion/mass effect. Chronic changes of left caudate, right thalamus and bilateral cerebellar infarcts. Moderate brain parenchymal volume loss  and deep white matter microangiopathy. Vascular: Calcific atherosclerotic disease. Skull: Normal. Negative for fracture or focal lesion. Sinuses/Orbits: No acute finding. Other: None. IMPRESSION: 1. No acute intracranial abnormality. 2. Atrophy, chronic microvascular disease. 3. Chronic infarcts of the left caudate, right thalamus and bilateral cerebellar hemispheres. Electronically Signed   By: Fidela Salisbury M.D.   On: 06/16/2019 16:43   Dg Chest Port 1 View  Result Date: 06/16/2019 CLINICAL DATA:  Failure to thrive. EXAM: PORTABLE CHEST 1 VIEW COMPARISON:  05/07/2016 FINDINGS: Patient is moderately rotated to the left. Lungs are hypoinflated without focal airspace consolidation, effusion or pneumothorax. Cardiomediastinal silhouette and remainder of the exam is unchanged. IMPRESSION: Hypoinflation without acute cardiopulmonary disease. Electronically Signed   By: Marin Olp M.D.   On: 06/16/2019 14:31    EKG: Independently reviewed. Sinus rhythm, LVH with repolarization abnormality.   Assessment/Plan   1. Hyperkalemia; acute kidney injury; metabolic acidosis    - Presents from home with 2 days of not eating and progressive lethargy, found to have creatinine of 2.24 (baseline unclear, was 1.50 a year ago) with potassium of 7.1 and bicarbonate of 13  - Likely a prerenal azotemia in setting of anorexia and hypotension, in addition to continued supplemental potassium use at home  - EKG with mild peaking of T's, normal QRS and QT intervals  - Treated in ED with albuterol, calcium, insulin, IVF, and bicarbonate  - ED discussed with nephrology who suspects the potassium will correct rapidly and recommends bicarbonate infusion  - Check urine chemistries, hold antihypertensives, continue bicarbonate infusion, renally-dose medications, continue cardiac monitoring, follow serial potassium levels and repeat chem panel in am    2. Hypotension; history of HTN  - SBP was 80's with EMS, 90 on arrival  to ED after 500 cc NS with EMS  - Likely secondary to hypovolemia and continued use of her multiple antihypertensives  - Continue to hold scheduled antihypertensives, continue IVF hydration    3. Chronic diastolic CHF  - BNP is slightly elevated in ED, but patient is hypovolemic in setting of anorexia with continued diuretic use  - She was hydrated in ED and is continued on bicarbonate infusion per nephrology recommendations  - Follow daily wt and I/O's, hold Lasix, hold beta blocker given her initial hypotension and resume if BP remains stable after IVF hydration   4. Insulin-dependent DM; hypoglycemia  - No recent A1c on file - Managed at home with Lantus 15 units BID  - She had serum glucose of 66 in ED in setting of anorexia and continued insulin use  - Monitor CBG's and use a low-intensity SSI with insulin as needed    5. Elevated troponin  - Troponin is  elevated to 0.08 in ED - Patient denies chest pain or SOB  - Continue cardiac monitoring, continue statin and Plavix, trend troponin    6. Confusion, lethargy, anorexia  - Patient is reportedly alert and conversant but often confuesd at baseline, now presenting with 2 days of anorexia and lethargy   - She is alert in ED, conversant, agitated at times but able to be redirected, and was asking for food and water shortly after arriving  - Head CT is negative for acute findings   - BUN is 130, likely contributing  - Continue supportive care with IVF hydration, encourage to eat, consult with PT   7. History of CVA  - No acute findings on CT head  - Continue statin and Plavix    8. Intertrigo  - There is erythema about the diaper and intertriginous areas with maceration, erosions, and excoriations  - Start topical clotrimazole     PPE: Mask, face shield. Patient wearing mask.  DVT prophylaxis: sq heparin  Code Status: Full, confirmed with son  Family Communication: Son, Lakeria Starkman, updated by phone  Consults called: Nephrology  consulted by ED physician  Admission status: Observation     Vianne Bulls, MD [facility] Pager [phone]  If 7PM-7AM, please contact night-coverage [email or web address] Password [email or web address]  06/16/2019, 8:52 PM

## 2019-06-16 NOTE — ED Notes (Signed)
Pt requesting ice water- gave pt 2 cups of ice water, drank both very quickly

## 2019-06-16 NOTE — ED Triage Notes (Signed)
Pt presents with failure to thrive d/t not eating, drinking, and more lethargic than normal. Per EMS pt was hypotensive SBP in 80's; after 553mL bolus pt B/P 113/67.

## 2019-06-16 NOTE — ED Notes (Signed)
CBG 83 

## 2019-06-16 NOTE — ED Provider Notes (Signed)
Patient signed out to me from Dr. Vanita Panda.  68 year old female here with decreased intake decreased interaction with family. Physical Exam  BP (!) 90/47   Pulse 62   Temp 98.2 F (36.8 C) (Rectal)   Resp (!) 25   SpO2 100%   Physical Exam  ED Course/Procedures   Clinical Course as of Jun 17 847  Sun Jun 16, 2019  1814 Check with the nurse regarding the patient's lab work.  She said the lab but said it was grossly hemolyzed and has been redrawn and sent.   [MB]  1919 Patient's CMP resulted and called and it has critical potassium of 7.1.  I have ordered a stat EKG and place the patient on a cardiac monitor.  We are giving her calcium and insulin and glucose along with albuterol nebulizer.  On review of her prior lab values her creatinine of 2.2 was elevated from a baseline around 1.5.  Her hemoglobin is also greatly elevated and this likely reflects her volume depletion.  Her troponin is also abated at 0.08.   [MB]  1935 Patient is awake and alert.  She denies any complaints.  She states she would like to go home but I tried to explain to her that I thought it was very important that she stay in the hospital.  Her EKG does not show any particular peaking of T waves.  I have ordered her medications and she is receiving now.   [MB]  2011 The nurse that the patient had soaked through her diaper urinating and the peer work did not catch it but she has been making some urine.   [MB]  2018 Discussed with Dr. Justin Mend from nephrology.  He feels this is likely prerenal but because of her low bicarb would recommend starting her on maintenance fluids of D5W with 3 A of bicarb 125 cc an hour and recheck her potassium in 3 to 4 hours.  He asked that the hospitalist consult him if there were any problems.   [MB]  2211 Patient's repeat potassium is now 5.9 after fluids and medication.   [MB]    Clinical Course User Index [MB] Hayden Rasmussen, MD    .Critical Care Performed by: Hayden Rasmussen,  MD Authorized by: Hayden Rasmussen, MD   Critical care provider statement:    Critical care time (minutes):  45   Critical care time was exclusive of:  Separately billable procedures and treating other patients   Critical care was necessary to treat or prevent imminent or life-threatening deterioration of the following conditions:  Dehydration and metabolic crisis   Critical care was time spent personally by me on the following activities:  Discussions with consultants, evaluation of patient's response to treatment, examination of patient, ordering and performing treatments and interventions, ordering and review of laboratory studies, ordering and review of radiographic studies, pulse oximetry, re-evaluation of patient's condition, obtaining history from patient or surrogate, review of old charts and development of treatment plan with patient or surrogate   I assumed direction of critical care for this patient from another provider in my specialty: no      MDM  Plan is to hydrate patient and follow-up on labs which are pending.  Likely will need admission to the hospital.       Hayden Rasmussen, MD 06/17/19 [phone]

## 2019-06-16 NOTE — ED Notes (Signed)
ED TO INPATIENT HANDOFF REPORT  ED Nurse Name and Phone #: Judye Bos Name/Age/Gender Angela Hays 68 y.o. female Room/Bed: WA09/WA09  Code Status   Code Status: Prior  Home/SNF/Other Home AOx4 Is this baseline? YES  Triage Complete: Triage complete  Chief Complaint FTT  Triage Note Pt presents with failure to thrive d/t not eating, drinking, and more lethargic than normal. Per EMS pt was hypotensive SBP in 80's; after 535mL bolus pt B/P 113/67.   Allergies No Known Allergies  Level of Care/Admitting Diagnosis ED Disposition    ED Disposition Condition Comment   Admit  Hospital Area: [place] [[id number]]  Level of Care: Stepdown [14]  Admit to SDU based on following criteria: Severe physiological/psychological symptoms:  Any diagnosis requiring assessment & intervention at least every 4 hours on an ongoing basis to obtain desired patient outcomes including stability and rehabilitation  Covid Evaluation: Screening Protocol (No Symptoms)  Diagnosis: Hyperkalemia [950932]  Admitting Physician: Vianne Bulls [[id number]]  Attending Physician: Vianne Bulls [[id number]]  PT Class (Do Not Modify): Observation [104]  PT Acc Code (Do Not Modify): Observation [10022]       B Medical/Surgery History Past Medical History:  Diagnosis Date  . Diabetes mellitus without complication ([place])   . Hypertension   . Stroke [facility])    History reviewed. No pertinent surgical history.   A IV Location/Drains/Wounds Patient Lines/Drains/Airways Status   Active Line/Drains/Airways    Name:   Placement date:   Placement time:   Site:   Days:   Peripheral IV 06/16/19 Right Forearm   06/16/19    -    Forearm   less than 1   Peripheral IV 06/16/19 Right;Posterior Forearm   06/16/19    2110    Forearm   less than 1   Pressure Ulcer 01/05/16 Stage I -  Intact skin with non-blanchable redness of a localized area usually over a bony prominence.   01/05/16    1830     1258    Wound / Incision (Open or Dehisced) 01/05/16 Other (Comment) Leg Left;Lower   01/05/16    1830    Leg   1258   Wound / Incision (Open or Dehisced) 05/07/16 Leg Right;Left cellulitis,   05/07/16    0007    Leg   1135          Intake/Output Last 24 hours  Intake/Output Summary (Last 24 hours) at 06/16/2019 2326 Last data filed at 06/16/2019 2026 Gross per 24 hour  Intake 99.02 ml  Output 150 ml  Net -50.98 ml    Labs/Imaging Results for orders placed or performed during the hospital encounter of 06/16/19 (from the past 48 hour(s))  Urinalysis, Routine w reflex microscopic     Status: Abnormal   Collection Time: 06/16/19  1:57 PM  Result Value Ref Range   Color, Urine YELLOW YELLOW   APPearance CLEAR CLEAR   Specific Gravity, Urine 1.010 1.005 - 1.030   pH 5.0 5.0 - 8.0   Glucose, UA 50 (A) NEGATIVE mg/dL   Hgb urine dipstick NEGATIVE NEGATIVE   Bilirubin Urine NEGATIVE NEGATIVE   Ketones, ur NEGATIVE NEGATIVE mg/dL   Protein, ur NEGATIVE NEGATIVE mg/dL   Nitrite NEGATIVE NEGATIVE   Leukocytes,Ua MODERATE (A) NEGATIVE   RBC / HPF 0-5 0 - 5 RBC/hpf   WBC, UA 0-5 0 - 5 WBC/hpf   Bacteria, UA MANY (A) NONE SEEN   Squamous Epithelial / LPF 0-5 0 -  5   Mucus PRESENT     Comment: Performed at [facility], [place] [address] [place], [place] [address]  CBG monitoring, ED     Status: None   Collection Time: 06/16/19  1:59 PM  Result Value Ref Range   Glucose-Capillary 70 70 - 99 mg/dL  Brain natriuretic peptide     Status: Abnormal   Collection Time: 06/16/19  2:58 PM  Result Value Ref Range   B Natriuretic Peptide 149.9 (H) 0.0 - 100.0 pg/mL    Comment: Performed at [facility], [place] [address], [place], [place] [address]  CBC with Differential     Status: Abnormal   Collection Time: 06/16/19  2:58 PM  Result Value Ref Range   WBC 18.9 (H) 4.0 - 10.5 K/uL   RBC 5.58 (H) 3.87 - 5.11 MIL/uL   Hemoglobin 16.1 (H) 12.0 - 15.0 g/dL   HCT 49.4  (H) 36.0 - 46.0 %   MCV 88.5 80.0 - 100.0 fL   MCH 28.9 26.0 - 34.0 pg   MCHC 32.6 30.0 - 36.0 g/dL   RDW 14.8 11.5 - 15.5 %   Platelets 301 150 - 400 K/uL   nRBC 0.0 0.0 - 0.2 %   Neutrophils Relative % 89 %   Neutro Abs 16.9 (H) 1.7 - 7.7 K/uL   Lymphocytes Relative 4 %   Lymphs Abs 0.7 0.7 - 4.0 K/uL   Monocytes Relative 6 %   Monocytes Absolute 1.1 (H) 0.1 - 1.0 K/uL   Eosinophils Relative 0 %   Eosinophils Absolute 0.0 0.0 - 0.5 K/uL   Basophils Relative 0 %   Basophils Absolute 0.0 0.0 - 0.1 K/uL   Immature Granulocytes 1 %   Abs Immature Granulocytes 0.11 (H) 0.00 - 0.07 K/uL    Comment: Performed at [facility], [place] [address], [place], [place] [address]  Comprehensive metabolic panel     Status: Abnormal   Collection Time: 06/16/19  5:47 PM  Result Value Ref Range   Sodium 132 (L) 135 - 145 mmol/L   Potassium 7.1 (HH) 3.5 - 5.1 mmol/L    Comment: CRITICAL RESULT CALLED TO, READ BACK BY AND VERIFIED WITH: R.BARHAM,RN [id number] @1910  BY V.WILKINS    Chloride 107 98 - 111 mmol/L   CO2 13 (L) 22 - 32 mmol/L   Glucose, Bld 62 (L) 70 - 99 mg/dL   BUN 113 (H) 8 - 23 mg/dL    Comment: RESULTS CONFIRMED BY MANUAL DILUTION   Creatinine, Ser 2.24 (H) 0.44 - 1.00 mg/dL   Calcium 8.3 (L) 8.9 - 10.3 mg/dL   Total Protein 6.5 6.5 - 8.1 g/dL   Albumin 3.3 (L) 3.5 - 5.0 g/dL   AST 22 15 - 41 U/L   ALT 17 0 - 44 U/L   Alkaline Phosphatase 92 38 - 126 U/L   Total Bilirubin 0.7 0.3 - 1.2 mg/dL   GFR calc non Af Amer 22 (L) >60 mL/min   GFR calc Af Amer 25 (L) >60 mL/min   Anion gap 12 5 - 15    Comment: Performed at [facility], [place] [address], [place], [place] [address]  Lipase, blood     Status: None   Collection Time: 06/16/19  5:47 PM  Result Value Ref Range   Lipase 34 11 - 51 U/L    Comment: Performed at [facility], [place] [address], [place], [place] [address]  Troponin I -  Status: Abnormal   Collection Time:  06/16/19  5:47 PM  Result Value Ref Range   Troponin I 0.08 (HH) <0.03 ng/mL    Comment: CRITICAL RESULT CALLED TO, READ BACK BY AND VERIFIED WITH: R.BARHAM,RN [id number] @1905  BY V.WILKINS Performed at [place] [address], [place], [place] [address]   CBG monitoring, ED     Status: None   Collection Time: 06/16/19  7:32 PM  Result Value Ref Range   Glucose-Capillary 83 70 - 99 mg/dL  CBG monitoring, ED     Status: Abnormal   Collection Time: 06/16/19  8:36 PM  Result Value Ref Range   Glucose-Capillary 185 (H) 70 - 99 mg/dL  Sodium, urine, random     Status: None   Collection Time: 06/16/19  8:43 PM  Result Value Ref Range   Sodium, Ur 46 mmol/L    Comment: Performed at [facility], [place] [address] [place], [place] [address]  Creatinine, urine, random     Status: None   Collection Time: 06/16/19  8:43 PM  Result Value Ref Range   Creatinine, Urine 26.34 mg/dL    Comment: Performed at [facility], [place] [address], [place], [place] [address]  Basic metabolic panel     Status: Abnormal   Collection Time: 06/16/19  9:05 PM  Result Value Ref Range   Sodium 131 (L) 135 - 145 mmol/L   Potassium 5.9 (H) 3.5 - 5.1 mmol/L    Comment: DELTA CHECK NOTED   Chloride 103 98 - 111 mmol/L   CO2 16 (L) 22 - 32 mmol/L   Glucose, Bld 205 (H) 70 - 99 mg/dL   BUN 136 (H) 8 - 23 mg/dL    Comment: RESULTS CONFIRMED BY MANUAL DILUTION   Creatinine, Ser 2.06 (H) 0.44 - 1.00 mg/dL   Calcium 9.6 8.9 - 10.3 mg/dL   GFR calc non Af Amer 24 (L) >60 mL/min   GFR calc Af Amer 28 (L) >60 mL/min   Anion gap 12 5 - 15    Comment: Performed at [facility], [place] [address], [place], [place] [address]  Troponin I -     Status: Abnormal   Collection Time: 06/16/19  9:15 PM  Result Value Ref Range   Troponin I 0.08 (HH) <0.03 ng/mL    Comment: CRITICAL VALUE NOTED.  VALUE IS CONSISTENT WITH PREVIOUSLY REPORTED AND CALLED  VALUE. Performed at [facility], [place] [address], [place], [place] [address]   CK     Status: None   Collection Time: 06/16/19  9:16 PM  Result Value Ref Range   Total CK 172 38 - 234 U/L    Comment: Performed at [facility], [place] [address] [place], [place] [address]   Ct Head Wo Contrast  Result Date: 06/16/2019 CLINICAL DATA:  Altered mental status. EXAM: CT HEAD WITHOUT CONTRAST TECHNIQUE: Contiguous axial images were obtained from the base of the skull through the vertex without intravenous contrast. COMPARISON:  05/10/2006 FINDINGS: Brain: No evidence of acute infarction, hemorrhage, hydrocephalus, extra-axial collection or mass lesion/mass effect. Chronic changes of left caudate, right thalamus and bilateral cerebellar infarcts. Moderate brain parenchymal volume loss and deep white matter microangiopathy. Vascular: Calcific atherosclerotic disease. Skull: Normal. Negative for fracture or focal lesion. Sinuses/Orbits: No acute finding. Other: None. IMPRESSION: 1. No acute intracranial abnormality. 2. Atrophy, chronic microvascular disease. 3. Chronic infarcts of the left caudate, right thalamus and bilateral cerebellar hemispheres. Electronically  Signed   By: Fidela Salisbury M.D.   On: 06/16/2019 16:43   Dg Chest Port 1 View  Result Date: 06/16/2019 CLINICAL DATA:  Failure to thrive. EXAM: PORTABLE CHEST 1 VIEW COMPARISON:  05/07/2016 FINDINGS: Patient is moderately rotated to the left. Lungs are hypoinflated without focal airspace consolidation, effusion or pneumothorax. Cardiomediastinal silhouette and remainder of the exam is unchanged. IMPRESSION: Hypoinflation without acute cardiopulmonary disease. Electronically Signed   By: Marin Olp M.D.   On: 06/16/2019 14:31    Pending Labs Unresulted Labs (From admission, onward)    Start     Ordered   06/17/19 0500  Troponin I - Tomorrow AM 0500  Tomorrow morning,   R     06/16/19 2059   06/17/19  0500  TSH  Tomorrow morning,   R     06/16/19 2144   06/17/19 0500  Vitamin B12  Tomorrow morning,   R     06/16/19 2144   06/17/19 0500  Folate RBC  Tomorrow morning,   R     06/16/19 2144   06/17/19 0000  Troponin I - Once-Timed  Once-Timed,   STAT     06/16/19 2059   06/16/19 2043  Urea nitrogen, urine  ONCE - STAT,   STAT     06/16/19 2043   06/16/19 1528  Novel Coronavirus,NAA,(SEND-OUT TO REF LAB - TAT 24-48 hrs); Hosp Order  (Asymptomatic Patients Labs)  Once,   STAT    Question:  Rule Out  Answer:  Yes   06/16/19 1528   Signed and Held  HIV antibody (Routine Testing)  Tomorrow morning,   R     Signed and Held   Signed and Held  Potassium  Now then every 4 hours,   STAT    Comments: Until normal twice.    Signed and Held   Signed and Held  Basic metabolic panel  Tomorrow morning,   R     Signed and Held   Signed and Held  CBC WITH DIFFERENTIAL  Tomorrow morning,   R     Signed and Held   Signed and Held  Urine culture  ONCE - STAT,   R     Signed and Held   Signed and Held  Osmolality, urine  ONCE - STAT,   STAT     Signed and Held          Vitals/Pain Today's Vitals   06/16/19 2145 06/16/19 2200 06/16/19 2230 06/16/19 2300  BP:  (!) 122/55 126/62 (!) 122/95  Pulse: 84  80 81  Resp: (!) 21 17 (!) 30 (!) 42  Temp:      TempSrc:      SpO2: 100%  100% 97%    Isolation Precautions No active isolations  Medications Medications  albuterol (PROVENTIL) (2.5 MG/3ML) 0.083% nebulizer solution 10 mg (10 mg Nebulization Not Given 06/16/19 2151)  sodium bicarbonate 150 mEq in dextrose 5 % 1,000 mL infusion ( Intravenous New Bag/Given 06/16/19 2050)  HYDROcodone-acetaminophen (NORCO/VICODIN) 5-325 MG per tablet 1 tablet (has no administration in time range)  fentaNYL (SUBLIMAZE) injection 25 mcg (0.5 mcg Intravenous Given 06/16/19 2141)  clotrimazole (LOTRIMIN) 1 % cream (has no administration in time range)  sodium chloride 0.9 % bolus 1,000 mL (0 mLs Intravenous Stopped  06/16/19 1824)  calcium chloride 1 g in sodium chloride 0.9 % 100 mL IVPB (0 g Intravenous Stopped 06/16/19 2019)  insulin aspart (novoLOG) injection 5 Units (5 Units Intravenous Given 06/16/19 1952)  And  dextrose 50 % solution 50 mL (50 mLs Intravenous Given 06/16/19 1952)  albuterol (VENTOLIN HFA) 108 (90 Base) MCG/ACT inhaler 2 puff (2 puffs Inhalation Given 06/16/19 2024)    Mobility Uncertain High fall risk   Focused Assessments NA   R Recommendations: See Admitting Provider Note  Report given to:   Additional Notes: NA

## 2019-06-17 DIAGNOSIS — H547 Unspecified visual loss: Secondary | ICD-10-CM | POA: Diagnosis present

## 2019-06-17 DIAGNOSIS — L304 Erythema intertrigo: Secondary | ICD-10-CM | POA: Diagnosis present

## 2019-06-17 DIAGNOSIS — N183 Chronic kidney disease, stage 3 (moderate): Secondary | ICD-10-CM | POA: Diagnosis present

## 2019-06-17 DIAGNOSIS — E872 Acidosis: Secondary | ICD-10-CM | POA: Diagnosis present

## 2019-06-17 DIAGNOSIS — L8990 Pressure ulcer of unspecified site, unspecified stage: Secondary | ICD-10-CM | POA: Insufficient documentation

## 2019-06-17 DIAGNOSIS — B372 Candidiasis of skin and nail: Secondary | ICD-10-CM | POA: Diagnosis present

## 2019-06-17 DIAGNOSIS — E871 Hypo-osmolality and hyponatremia: Secondary | ICD-10-CM | POA: Diagnosis not present

## 2019-06-17 DIAGNOSIS — G9341 Metabolic encephalopathy: Secondary | ICD-10-CM | POA: Diagnosis present

## 2019-06-17 DIAGNOSIS — E11649 Type 2 diabetes mellitus with hypoglycemia without coma: Secondary | ICD-10-CM | POA: Diagnosis present

## 2019-06-17 DIAGNOSIS — I69398 Other sequelae of cerebral infarction: Secondary | ICD-10-CM | POA: Diagnosis not present

## 2019-06-17 DIAGNOSIS — I248 Other forms of acute ischemic heart disease: Secondary | ICD-10-CM | POA: Diagnosis present

## 2019-06-17 DIAGNOSIS — I69354 Hemiplegia and hemiparesis following cerebral infarction affecting left non-dominant side: Secondary | ICD-10-CM | POA: Diagnosis not present

## 2019-06-17 DIAGNOSIS — H9190 Unspecified hearing loss, unspecified ear: Secondary | ICD-10-CM | POA: Diagnosis present

## 2019-06-17 DIAGNOSIS — R531 Weakness: Secondary | ICD-10-CM | POA: Diagnosis not present

## 2019-06-17 DIAGNOSIS — Z515 Encounter for palliative care: Secondary | ICD-10-CM | POA: Diagnosis not present

## 2019-06-17 DIAGNOSIS — Z6823 Body mass index (BMI) 23.0-23.9, adult: Secondary | ICD-10-CM | POA: Diagnosis not present

## 2019-06-17 DIAGNOSIS — E861 Hypovolemia: Secondary | ICD-10-CM | POA: Diagnosis present

## 2019-06-17 DIAGNOSIS — N179 Acute kidney failure, unspecified: Secondary | ICD-10-CM | POA: Diagnosis present

## 2019-06-17 DIAGNOSIS — E875 Hyperkalemia: Secondary | ICD-10-CM | POA: Diagnosis present

## 2019-06-17 DIAGNOSIS — R627 Adult failure to thrive: Secondary | ICD-10-CM | POA: Diagnosis present

## 2019-06-17 DIAGNOSIS — I9589 Other hypotension: Secondary | ICD-10-CM | POA: Diagnosis not present

## 2019-06-17 DIAGNOSIS — I5032 Chronic diastolic (congestive) heart failure: Secondary | ICD-10-CM | POA: Diagnosis present

## 2019-06-17 DIAGNOSIS — Z7189 Other specified counseling: Secondary | ICD-10-CM | POA: Diagnosis not present

## 2019-06-17 DIAGNOSIS — I130 Hypertensive heart and chronic kidney disease with heart failure and stage 1 through stage 4 chronic kidney disease, or unspecified chronic kidney disease: Secondary | ICD-10-CM | POA: Diagnosis present

## 2019-06-17 DIAGNOSIS — E1122 Type 2 diabetes mellitus with diabetic chronic kidney disease: Secondary | ICD-10-CM | POA: Diagnosis present

## 2019-06-17 DIAGNOSIS — Z1159 Encounter for screening for other viral diseases: Secondary | ICD-10-CM | POA: Diagnosis not present

## 2019-06-17 DIAGNOSIS — E1151 Type 2 diabetes mellitus with diabetic peripheral angiopathy without gangrene: Secondary | ICD-10-CM | POA: Diagnosis not present

## 2019-06-17 DIAGNOSIS — D751 Secondary polycythemia: Secondary | ICD-10-CM | POA: Diagnosis present

## 2019-06-17 DIAGNOSIS — E46 Unspecified protein-calorie malnutrition: Secondary | ICD-10-CM | POA: Diagnosis present

## 2019-06-17 LAB — GLUCOSE, CAPILLARY
Glucose-Capillary: 137 mg/dL — ABNORMAL HIGH (ref 70–99)
Glucose-Capillary: 124 mg/dL — ABNORMAL HIGH (ref 70–99)
Glucose-Capillary: 122 mg/dL — ABNORMAL HIGH (ref 70–99)
Glucose-Capillary: 255 mg/dL — ABNORMAL HIGH (ref 70–99)
Glucose-Capillary: 94 mg/dL (ref 70–99)
Glucose-Capillary: 87 mg/dL (ref 70–99)

## 2019-06-17 LAB — CBC WITH DIFFERENTIAL/PLATELET
Abs Immature Granulocytes: 0.05 10*3/uL (ref 0.00–0.07)
Basophils Absolute: 0 10*3/uL (ref 0.0–0.1)
Basophils Relative: 0 %
Eosinophils Absolute: 0.1 10*3/uL (ref 0.0–0.5)
Eosinophils Relative: 0 %
HCT: 42.1 % (ref 36.0–46.0)
Hemoglobin: 14.5 g/dL (ref 12.0–15.0)
Immature Granulocytes: 0 %
Lymphocytes Relative: 7 %
Lymphs Abs: 0.9 10*3/uL (ref 0.7–4.0)
MCH: 29.4 pg (ref 26.0–34.0)
MCHC: 34.4 g/dL (ref 30.0–36.0)
MCV: 85.4 fL (ref 80.0–100.0)
Monocytes Absolute: 0.8 10*3/uL (ref 0.1–1.0)
Monocytes Relative: 6 %
Neutro Abs: 11.9 10*3/uL — ABNORMAL HIGH (ref 1.7–7.7)
Neutrophils Relative %: 87 %
Platelets: 252 10*3/uL (ref 150–400)
RBC: 4.93 MIL/uL (ref 3.87–5.11)
RDW: 14.7 % (ref 11.5–15.5)
WBC: 13.7 10*3/uL — ABNORMAL HIGH (ref 4.0–10.5)
nRBC: 0 % (ref 0.0–0.2)

## 2019-06-17 LAB — BASIC METABOLIC PANEL
Anion gap: 13 (ref 5–15)
BUN: 165 mg/dL — ABNORMAL HIGH (ref 8–23)
CO2: 18 mmol/L — ABNORMAL LOW (ref 22–32)
Calcium: 8.7 mg/dL — ABNORMAL LOW (ref 8.9–10.3)
Chloride: 99 mmol/L (ref 98–111)
Creatinine, Ser: 1.86 mg/dL — ABNORMAL HIGH (ref 0.44–1.00)
GFR calc Af Amer: 31 mL/min — ABNORMAL LOW (ref >60)
GFR calc non Af Amer: 27 mL/min — ABNORMAL LOW (ref >60)
Glucose, Bld: 139 mg/dL — ABNORMAL HIGH (ref 70–99)
Potassium: 5.5 mmol/L — ABNORMAL HIGH (ref 3.5–5.1)
Sodium: 130 mmol/L — ABNORMAL LOW (ref 135–145)

## 2019-06-17 LAB — HEMOGLOBIN A1C
Hgb A1c MFr Bld: 10.7 % — ABNORMAL HIGH (ref 4.8–5.6)
Mean Plasma Glucose: 260.39 mg/dL

## 2019-06-17 LAB — OSMOLALITY, URINE: Osmolality, Ur: 398 mOsm/kg (ref 300–900)

## 2019-06-17 LAB — MRSA PCR SCREENING: MRSA by PCR: NEGATIVE

## 2019-06-17 LAB — TROPONIN I: Troponin I: 0.07 ng/mL (ref <0.03)

## 2019-06-17 LAB — VITAMIN B12: Vitamin B-12: 280 pg/mL (ref 180–914)

## 2019-06-17 LAB — POTASSIUM
Potassium: 5.4 mmol/L — ABNORMAL HIGH (ref 3.5–5.1)
Potassium: 4.3 mmol/L (ref 3.5–5.1)

## 2019-06-17 LAB — TSH: TSH: 0.947 u[IU]/mL (ref 0.350–4.500)

## 2019-06-17 MED ORDER — ACETAMINOPHEN 650 MG RE SUPP: 650.0000 mg | Freq: Four times a day (QID) | RECTAL | Status: DC | PRN

## 2019-06-17 MED ORDER — LACTATED RINGERS IV SOLN
INTRAVENOUS | Status: DC
  Administered 2019-06-17 – 2019-06-19 (×4): via INTRAVENOUS

## 2019-06-17 MED ORDER — HALOPERIDOL LACTATE 5 MG/ML IJ SOLN: 5.0000 mg | Freq: Once | INTRAMUSCULAR | Status: DC

## 2019-06-17 MED ORDER — CHLORHEXIDINE GLUCONATE CLOTH 2 % EX PADS
6.0000 | MEDICATED_PAD | Freq: Every day | CUTANEOUS | Status: DC
  Administered 2019-06-17 – 2019-06-21 (×5): 6 via TOPICAL

## 2019-06-17 MED ORDER — CLOPIDOGREL BISULFATE 75 MG PO TABS
75.0000 mg | ORAL_TABLET | Freq: Every day | ORAL | Status: DC
  Administered 2019-06-17 – 2019-06-22 (×6): 75 mg via ORAL
  Filled 2019-06-17 (×6): qty 1

## 2019-06-17 MED ORDER — SODIUM CHLORIDE 0.9% FLUSH
3.0000 mL | Freq: Two times a day (BID) | INTRAVENOUS | Status: DC
  Administered 2019-06-17 – 2019-06-22 (×9): 3 mL via INTRAVENOUS

## 2019-06-17 MED ORDER — INSULIN ASPART 100 UNIT/ML ~~LOC~~ SOLN
0.0000 [IU] | Freq: Three times a day (TID) | SUBCUTANEOUS | Status: DC
  Administered 2019-06-17: 1 [IU] via SUBCUTANEOUS
  Administered 2019-06-17: 12:00:00 5 [IU] via SUBCUTANEOUS
  Administered 2019-06-18: 12:00:00 3 [IU] via SUBCUTANEOUS
  Administered 2019-06-19 – 2019-06-20 (×5): 2 [IU] via SUBCUTANEOUS
  Administered 2019-06-20: 5 [IU] via SUBCUTANEOUS
  Administered 2019-06-21: 2 [IU] via SUBCUTANEOUS
  Administered 2019-06-21: 1 [IU] via SUBCUTANEOUS
  Administered 2019-06-21 – 2019-06-22 (×2): 2 [IU] via SUBCUTANEOUS
  Administered 2019-06-22: 5 [IU] via SUBCUTANEOUS

## 2019-06-17 MED ORDER — ONDANSETRON HCL 4 MG/2ML IJ SOLN: 4.0000 mg | Freq: Four times a day (QID) | INTRAMUSCULAR | Status: DC | PRN

## 2019-06-17 MED ORDER — INSULIN ASPART 100 UNIT/ML ~~LOC~~ SOLN: 0.0000 [IU] | Freq: Every day | SUBCUTANEOUS | Status: DC

## 2019-06-17 MED ORDER — HEPARIN SODIUM (PORCINE) 5000 UNIT/ML IJ SOLN
5000.0000 [IU] | Freq: Three times a day (TID) | INTRAMUSCULAR | Status: DC
  Administered 2019-06-17 – 2019-06-22 (×16): 5000 [IU] via SUBCUTANEOUS
  Filled 2019-06-17 (×16): qty 1

## 2019-06-17 MED ORDER — ACETAMINOPHEN 325 MG PO TABS: 650.0000 mg | ORAL_TABLET | Freq: Four times a day (QID) | ORAL | Status: DC | PRN

## 2019-06-17 MED ORDER — SIMVASTATIN 20 MG PO TABS
20.0000 mg | ORAL_TABLET | Freq: Every day | ORAL | Status: DC
  Administered 2019-06-17 – 2019-06-21 (×5): 20 mg via ORAL
  Filled 2019-06-17 (×2): qty 1
  Filled 2019-06-17 (×3): qty 2

## 2019-06-17 MED ORDER — ONDANSETRON HCL 4 MG PO TABS: 4.0000 mg | ORAL_TABLET | Freq: Four times a day (QID) | ORAL | Status: DC | PRN

## 2019-06-17 NOTE — Progress Notes (Addendum)
PROGRESS NOTE    Angela Hays  GXQ:[id number] DOB: 1951-06-30 DOA: 06/16/2019 PCP: Maury Dus, MD     Brief Narrative:  Angela Hays is a 68 y.o. female with medical history significant for insulin-dependent diabetes, hypertension, and history of CVA, now presenting to the emergency department for evaluation of worsening lethargy, not eating, and hypotension.  Patient's family reports that the patient has been more lethargic over the past couple days, has not eaten anything in 2 days, but has continued to drink some fluids.  She has continued to take her medications as prescribed.  Patient has not complained of anything in particular per report of family.  At baseline, she is alert and talkative.  EMS was called out, patient was found to have systolic pressure in the 14G, and she was given 500 cc of IV fluids prior to arrival in the ED. Upon arrival to the ED, patient is found to be afebrile, saturating adequately on room air, and with blood pressure 90/47.  EKG features sinus rhythm with LVH and repolarization abnormality.  Chest x-ray is notable for hypoinflation without acute cardiopulmonary disease.  Noncontrast head CT reveals chronic infarctions but no acute intracranial abnormality.  Chemistry panel is concerning for potassium of 7.1, bicarbonate of 13, and creatinine 2.24 with unclear baseline but up from 1.50 a year ago.  CBC features a leukocytosis to 18,900 with a mild polycythemia.  BNP is slightly elevated 250 and troponin to 0.08.  Patient was treated with albuterol, IV calcium, insulin with dextrose, bicarbonate, and 1 L of normal saline in the ED.  Nephrology was consulted by the ED physician, suspects this is a prerenal injury and anticipates rapid improvement in the potassium, recommending bicarbonate infusion and medical admission.  New events last 24 hours / Subjective: Patient still confused on exam, yells "Ma'am" when asked simple questions. Oriented to self only. Per RN,  patient did finish breakfast this morning.   Assessment & Plan:   Principal Problem:   Hyperkalemia Active Problems:   DM (diabetes mellitus), type 2 with peripheral vascular complications (HCC)   Renal insufficiency   Hyponatremia   Metabolic acidosis   Chronic diastolic CHF (congestive heart failure) (HCC)   Hypotension due to hypovolemia   Elevated troponin   Candidal intertrigo   Pressure injury of skin   AKI on CKD stage III -Presented with decreased oral intake, progressive lethargy -Baseline creatinine around 1.5 -Presented with Cr 2.24 --> 1.86 with IVF, continue -Likely prerenal azotemia in setting of anorexia, hypotension  Hyperkalemia -Has been taking supplemental potassium at home -Resolved now   Hypotension; history of HTN  -SBP was 80's with EMS, 90 on arrival to ED after 500 cc NS with EMS  -Likely secondary to hypovolemia and continued use of her multiple antihypertensives  -Hold antihypertensives, diuretic at this time  Chronic diastolic CHF  -BNP is slightly elevated in ED, but patient is hypovolemic in setting of anorexia with continued diuretic use  -Hold Lasix and continue to monitor volume status  Insulin-dependent DM; hypoglycemia  -Check Ha1c  -Sliding-scale insulin  Elevated troponin  -Demand ischemia   Acute metabolic encephalopathy  -Apparently at baseline, alert and conversant but often confuesd at baseline, now presenting with 2 days of anorexia and lethargy   -Head CT is negative for acute findings    History of right-sided CVA  -No acute findings on CT head  -With chronic hemiparesis of the left upper extremity and bilateral lower extremity contractures -Continue Zocor and Plavix  Intertrigo  -There is erythema about the diaper and intertriginous areas with maceration, erosions, and excoriations  -Topical clotrimazole    Left hip, posterior thigh wound, POA, not pressure injury related  -Appreciate Wound RN consult    Goals of care -Patient presents with anorexia, weakness, confusion, chronic contractures. She is apparently blind and is scheduled for cataract surgery as outpatient. Per son, he states that she is alert and conversational at baseline and paints a picture of a healthy individual. However, my exam is vastly different than the impression I receive from him.  -Asked RN if we can make exception for son to visit at bedside due to her continued confusion    DVT prophylaxis: Subq hep  Code Status: Full Family Communication: Son over the phone  Disposition Plan: Pending improvement, PO intake, palliative care consult    Consultants:   Palliative care  Procedures:   None   Antimicrobials:  Anti-infectives (From admission, onward)   None        Objective: Vitals:   06/17/19 0800 06/17/19 0900 06/17/19 1037 06/17/19 1039  BP: (!) 110/51 (!) 91/56    Pulse: (!) 104 (!) 108 (!) 112 (!) 111  Resp:  (!) 26 (!) 26 19  Temp:  97.9 F (36.6 C)    TempSrc:  Oral    SpO2: 99% 100% 100% 100%    Intake/Output Summary (Last 24 hours) at 06/17/2019 1202 Last data filed at 06/17/2019 0911 Gross per 24 hour  Intake 1514.91 ml  Output 1350 ml  Net 164.91 ml   There were no vitals filed for this visit.  Examination:  General exam: Appears calm  Respiratory system: Clear to auscultation. Respiratory effort normal. Cardiovascular system: S1 & S2 heard, tachycardic regular rhythm  Gastrointestinal system: Abdomen is nondistended, soft and nontender. No organomegaly or masses felt. Normal bowel sounds heard. Central nervous system: Alert and oriented to self only, chronic left hemiparesis Extremities: Bilateral lower extremity contractures present Skin: Left buttock with stage II pressure ulcers, present on admission Psychiatry: Appears very confused, does not answer questions appropriately  Data Reviewed: I have personally reviewed following labs and imaging studies  CBC: Recent Labs   Lab 06/16/19 1458 06/17/19 0207  WBC 18.9* 13.7*  NEUTROABS 16.9* 11.9*  HGB 16.1* 14.5  HCT 49.4* 42.1  MCV 88.5 85.4  PLT 301 229   Basic Metabolic Panel: Recent Labs  Lab 06/16/19 1747 06/16/19 2105 06/17/19 0207 06/17/19 0955  NA 132* 131* 130*  --   K 7.1* 5.9* 5.5*  5.4* 4.3  CL 107 103 99  --   CO2 13* 16* 18*  --   GLUCOSE 62* 205* 139*  --   BUN 113* 136* 165*  --   CREATININE 2.24* 2.06* 1.86*  --   CALCIUM 8.3* 9.6 8.7*  --    GFR: CrCl cannot be calculated (Unknown ideal weight.). Liver Function Tests: Recent Labs  Lab 06/16/19 1747  AST 22  ALT 17  ALKPHOS 92  BILITOT 0.7  PROT 6.5  ALBUMIN 3.3*   Recent Labs  Lab 06/16/19 1747  LIPASE 34   No results for input(s): AMMONIA in the last 168 hours. Coagulation Profile: No results for input(s): INR, PROTIME in the last 168 hours. Cardiac Enzymes: Recent Labs  Lab 06/16/19 1747 06/16/19 2115 06/16/19 2116 06/17/19 0207  CKTOTAL  --   --  172  --   TROPONINI 0.08* 0.08*  --  0.07*   BNP (last 3 results) No results for  input(s): PROBNP in the last 8760 hours. HbA1C: No results for input(s): HGBA1C in the last 72 hours. CBG: Recent Labs  Lab 06/16/19 2036 06/17/19 0207 06/17/19 0528 06/17/19 0813 06/17/19 1146  GLUCAP 185* 137* 124* 122* 255*   Lipid Profile: No results for input(s): CHOL, HDL, LDLCALC, TRIG, CHOLHDL, LDLDIRECT in the last 72 hours. Thyroid Function Tests: Recent Labs    06/17/19 0207  TSH 0.947   Anemia Panel: Recent Labs    06/17/19 0207  VITAMINB12 280   Sepsis Labs: No results for input(s): PROCALCITON, LATICACIDVEN in the last 168 hours.  Recent Results (from the past 240 hour(s))  MRSA PCR Screening     Status: None   Collection Time: 06/17/19  2:11 AM   Specimen: Nasal Mucosa; Nasopharyngeal  Result Value Ref Range Status   MRSA by PCR NEGATIVE NEGATIVE Final    Comment:        The GeneXpert MRSA Assay (FDA approved for NASAL specimens  only), is one component of a comprehensive MRSA colonization surveillance program. It is not intended to diagnose MRSA infection nor to guide or monitor treatment for MRSA infections. Performed at [facility], [place] [address], [place], [place] [address]        Radiology Studies: Ct Head Wo Contrast  Result Date: 06/16/2019 CLINICAL DATA:  Altered mental status. EXAM: CT HEAD WITHOUT CONTRAST TECHNIQUE: Contiguous axial images were obtained from the base of the skull through the vertex without intravenous contrast. COMPARISON:  05/10/2006 FINDINGS: Brain: No evidence of acute infarction, hemorrhage, hydrocephalus, extra-axial collection or mass lesion/mass effect. Chronic changes of left caudate, right thalamus and bilateral cerebellar infarcts. Moderate brain parenchymal volume loss and deep white matter microangiopathy. Vascular: Calcific atherosclerotic disease. Skull: Normal. Negative for fracture or focal lesion. Sinuses/Orbits: No acute finding. Other: None. IMPRESSION: 1. No acute intracranial abnormality. 2. Atrophy, chronic microvascular disease. 3. Chronic infarcts of the left caudate, right thalamus and bilateral cerebellar hemispheres. Electronically Signed   By: Fidela Salisbury M.D.   On: 06/16/2019 16:43   Dg Chest Port 1 View  Result Date: 06/16/2019 CLINICAL DATA:  Failure to thrive. EXAM: PORTABLE CHEST 1 VIEW COMPARISON:  05/07/2016 FINDINGS: Patient is moderately rotated to the left. Lungs are hypoinflated without focal airspace consolidation, effusion or pneumothorax. Cardiomediastinal silhouette and remainder of the exam is unchanged. IMPRESSION: Hypoinflation without acute cardiopulmonary disease. Electronically Signed   By: Marin Olp M.D.   On: 06/16/2019 14:31      Scheduled Meds: . Chlorhexidine Gluconate Cloth  6 each Topical Daily  . clopidogrel  75 mg Oral Daily  . clotrimazole   Topical BID  . heparin  5,000 Units Subcutaneous Q8H   . insulin aspart  0-5 Units Subcutaneous QHS  . insulin aspart  0-9 Units Subcutaneous TID WC  . simvastatin  20 mg Oral q1800  . sodium chloride flush  3 mL Intravenous Q12H   Continuous Infusions: .  sodium bicarbonate  infusion 1000 mL 125 mL/hr at 06/17/19 0911     LOS: 0 days    Time spent: 35 minutes   Dessa Phi, DO [facility] [email or web address] 06/17/2019, 12:02 PM

## 2019-06-17 NOTE — Progress Notes (Signed)
PT Cancellation Note  Patient Details Name: Angela Hays MRN: [id number] DOB: Jul 21, 1951   Cancelled Treatment:    Reason Eval/Treat Not Completed: Other (comment) RN reports pt cries out in pain with attempts to move LEs due to contractures.  Pt typically in w/c at baseline and lives with son.  Palliative care meeting pending today.  Will await plan of care and check back as schedule permits.   Hillard Goodwine,KATHrine E 06/17/2019, 2:27 PM [place], DPT Acute Rehabilitation Services Office: [phone] Pager: [phone]

## 2019-06-17 NOTE — Consult Note (Addendum)
[facility] Nurse wound consult note Reason for Consult: Consult requested for abd skin folds and left hip/posterior thigh Wound type:  Left hip/posterior thigh with patchy areas dark brown round scarred lesions of unknown etiology.  The location has evolved into a full thickness irregular-shaped wound; 7X5X.1cm, 90% red, 10% yellow slough red and moist. Appearance is NOT consistent with a pressure injury. Pt is frequently incontinent of urine and this is a barrier to healing.  Abd skin folds with red moist partial thickness skin loss; appearance is consistent with intertrigo. Dressing procedure/placement/frequency: Interdry silver-impregnated fabric to absorb drainage and provide antimicrobial benefits to abd skin fold.  Foam dressing to protect and promote healing to left hip/posterior thigh.   Please re-consult if further assistance is needed.  Thank-you,  Julien Girt MSN, [place], [place], [place], [place]

## 2019-06-18 DIAGNOSIS — R531 Weakness: Secondary | ICD-10-CM

## 2019-06-18 DIAGNOSIS — Z515 Encounter for palliative care: Secondary | ICD-10-CM

## 2019-06-18 DIAGNOSIS — Z7189 Other specified counseling: Secondary | ICD-10-CM

## 2019-06-18 LAB — CBC
HCT: 42.3 % (ref 36.0–46.0)
Hemoglobin: 14.6 g/dL (ref 12.0–15.0)
MCH: 29.3 pg (ref 26.0–34.0)
MCHC: 34.5 g/dL (ref 30.0–36.0)
MCV: 84.8 fL (ref 80.0–100.0)
Platelets: 252 10*3/uL (ref 150–400)
RBC: 4.99 MIL/uL (ref 3.87–5.11)
RDW: 14.6 % (ref 11.5–15.5)
WBC: 11.8 10*3/uL — ABNORMAL HIGH (ref 4.0–10.5)
nRBC: 0 % (ref 0.0–0.2)

## 2019-06-18 LAB — NOVEL CORONAVIRUS, NAA (HOSP ORDER, SEND-OUT TO REF LAB; TAT 18-24 HRS): SARS-CoV-2, NAA: NOT DETECTED

## 2019-06-18 LAB — GLUCOSE, CAPILLARY
Glucose-Capillary: 110 mg/dL — ABNORMAL HIGH (ref 70–99)
Glucose-Capillary: 217 mg/dL — ABNORMAL HIGH (ref 70–99)
Glucose-Capillary: 84 mg/dL (ref 70–99)
Glucose-Capillary: 163 mg/dL — ABNORMAL HIGH (ref 70–99)

## 2019-06-18 LAB — HIV ANTIBODY (ROUTINE TESTING W REFLEX): HIV Screen 4th Generation wRfx: NONREACTIVE

## 2019-06-18 LAB — URINE CULTURE

## 2019-06-18 LAB — BASIC METABOLIC PANEL
Anion gap: 13 (ref 5–15)
BUN: 101 mg/dL — ABNORMAL HIGH (ref 8–23)
CO2: 26 mmol/L (ref 22–32)
Calcium: 8 mg/dL — ABNORMAL LOW (ref 8.9–10.3)
Chloride: 101 mmol/L (ref 98–111)
Creatinine, Ser: 1.37 mg/dL — ABNORMAL HIGH (ref 0.44–1.00)
GFR calc Af Amer: 45 mL/min — ABNORMAL LOW (ref >60)
GFR calc non Af Amer: 39 mL/min — ABNORMAL LOW (ref >60)
Glucose, Bld: 95 mg/dL (ref 70–99)
Potassium: 3.3 mmol/L — ABNORMAL LOW (ref 3.5–5.1)
Sodium: 140 mmol/L (ref 135–145)

## 2019-06-18 LAB — UREA NITROGEN, URINE: Urea Nitrogen, Ur: 529 mg/dL

## 2019-06-18 MED ORDER — LIP MEDEX EX OINT
1.0000 "application " | TOPICAL_OINTMENT | CUTANEOUS | Status: DC | PRN
  Administered 2019-06-20: 1 via TOPICAL

## 2019-06-18 MED ORDER — POTASSIUM CHLORIDE CRYS ER 20 MEQ PO TBCR
40.0000 meq | EXTENDED_RELEASE_TABLET | Freq: Once | ORAL | Status: AC
  Administered 2019-06-18: 40 meq via ORAL
  Filled 2019-06-18: qty 2

## 2019-06-18 MED ORDER — LIP MEDEX EX OINT
TOPICAL_OINTMENT | CUTANEOUS | Status: AC
  Administered 2019-06-18: 10:00:00
  Filled 2019-06-18: qty 7

## 2019-06-18 NOTE — Progress Notes (Signed)
PROGRESS NOTE    Angela Hays  ZOX:[id number] DOB: 02/09/1951 DOA: 06/16/2019 PCP: Maury Dus, MD     Brief Narrative:  Angela Hays is a 68 y.o. female with medical history significant for insulin-dependent diabetes, hypertension, and history of CVA, now presenting to the emergency department for evaluation of worsening lethargy, not eating, and hypotension.  Patient's family reports that the patient has been more lethargic over the past couple days, has not eaten anything in 2 days, but has continued to drink some fluids.  She has continued to take her medications as prescribed.  Patient has not complained of anything in particular per report of family.  At baseline, she is alert and talkative.  EMS was called out, patient was found to have systolic pressure in the 81X, and she was given 500 cc of IV fluids prior to arrival in the ED. Upon arrival to the ED, patient is found to be afebrile, saturating adequately on room air, and with blood pressure 90/47.  EKG features sinus rhythm with LVH and repolarization abnormality.  Chest x-ray is notable for hypoinflation without acute cardiopulmonary disease.  Noncontrast head CT reveals chronic infarctions but no acute intracranial abnormality.  Chemistry panel is concerning for potassium of 7.1, bicarbonate of 13, and creatinine 2.24 with unclear baseline but up from 1.50 a year ago.  CBC features a leukocytosis to 18,900 with a mild polycythemia.  BNP is slightly elevated 250 and troponin to 0.08.  Patient was treated with albuterol, IV calcium, insulin with dextrose, bicarbonate, and 1 L of normal saline in the ED.  Nephrology was consulted by the ED physician, suspects this is a prerenal injury and anticipates rapid improvement in the potassium, recommending bicarbonate infusion and medical admission.  New events last 24 hours / Subjective: Patient much more oriented this morning, oriented to self and place but not to year.  Oral intake has improved  as well.  BP has been low, 95/54 this morning.   Assessment & Plan:   Principal Problem:   Hyperkalemia Active Problems:   DM (diabetes mellitus), type 2 with peripheral vascular complications (HCC)   Renal insufficiency   Hyponatremia   Metabolic acidosis   Chronic diastolic CHF (congestive heart failure) (HCC)   Hypotension due to hypovolemia   Elevated troponin   Candidal intertrigo   AKI (acute kidney injury) ([place])   Pressure injury of skin   AKI on CKD stage III -Presented with decreased oral intake, progressive lethargy -Baseline creatinine around 1.5 -Presented with Cr 2.24 --> 1.86 --> 1.37 with IVF -Likely prerenal azotemia in setting of anorexia, hypotension  Hyperkalemia -Has been taking supplemental potassium at home -Resolved now hypokalemic 3.3.  Replace  Hypotension; history of HTN  -SBP was 80's with EMS, 90 on arrival to ED after 500 cc NS with EMS  -Likely secondary to hypovolemia and continued use of her multiple antihypertensives (norvasc, catapres, cardura, lasix, labetalol) -Hold antihypertensives, diuretic at this time -Continue IV fluids  Chronic diastolic CHF  -BNP is slightly elevated in ED, but patient is hypovolemic in setting of anorexia with continued diuretic use  -Hold Lasix and continue to monitor volume status, appears dry  Insulin-dependent DM; hypoglycemia  -Ha1c 10.7  -Sliding-scale insulin  Elevated troponin  -Demand ischemia   Acute metabolic encephalopathy  -Apparently at baseline, alert and conversant but often confuesd at baseline, now presenting with 2 days of anorexia and lethargy   -Head CT is negative for acute findings    History of  right-sided CVA  -No acute findings on CT head  -With 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 She is apparently blind and is scheduled for cataract surgery as outpatient. Per son, he states that she is alert and conversational at baseline and paints a picture of a healthy individual. However, my exam is vastly different than the impression I receive from him.    DVT prophylaxis: Subq hep  Code Status: Full Family Communication: None Disposition Plan: Pending improvement in blood pressure, palliative care consult    Consultants:   Palliative care  Procedures:   None   Antimicrobials:  Anti-infectives (From admission, onward)   None       Objective: Vitals:   06/18/19 0623 06/18/19 0717 06/18/19 0747 06/18/19 0800  BP:  (!) 89/68  (!) 95/54  Pulse: (!) 131     Resp: 18 (!) 24    Temp:    98.7 F (37.1 C)  TempSrc:    Oral  SpO2: 98%   98%  Weight:   71 kg   Height:   5\' 3"  (1.6 m)     Intake/Output Summary (Last 24 hours) at 06/18/2019 1018 Last data filed at 06/18/2019 0600 Gross per 24 hour  Intake 1996.1 ml  Output 1550 ml  Net 446.1 ml   Filed Weights   06/18/19 0747  Weight: 71 kg   Examination: General exam: Appears calm  Respiratory system: Clear to auscultation. Respiratory effort normal. Cardiovascular system: S1 & S2 heard, tachycardic, regular rhythm. No JVD, murmurs, rubs, gallops or clicks. No pedal edema. Gastrointestinal system: Abdomen is nondistended, soft and nontender. No organomegaly or masses felt. Normal bowel sounds heard. Central nervous system: Alert and oriented to self and [facility] only, not to year.  Extremities: +contractures Psychiatry: ?Dementia    Data Reviewed: I have personally reviewed following labs and imaging studies  CBC: Recent Labs  Lab 06/16/19 1458 06/17/19 0207 06/18/19 0222   WBC 18.9* 13.7* 11.8*  NEUTROABS 16.9* 11.9*  --   HGB 16.1* 14.5 14.6  HCT 49.4* 42.1 42.3  MCV 88.5 85.4 84.8  PLT 301 252 500   Basic Metabolic Panel: Recent Labs  Lab 06/16/19 1747 06/16/19 2105 06/17/19 0207 06/17/19 0955 06/18/19 0222  NA 132* 131* 130*  --  140  K 7.1* 5.9* 5.5*  5.4* 4.3 3.3*  CL 107 103 99  --  101  CO2 13* 16* 18*  --  26  GLUCOSE 62* 205* 139*  --  95  BUN 113* 136* 165*  --  101*  CREATININE 2.24* 2.06* 1.86*  --  1.37*  CALCIUM 8.3* 9.6 8.7*  --  8.0*   GFR: Estimated Creatinine Clearance: 36.1 mL/min (A) (by C-G formula based on SCr of 1.37 mg/dL (H)). Liver Function Tests: Recent Labs  Lab 06/16/19 1747  AST 22  ALT 17  ALKPHOS 92  BILITOT 0.7  PROT 6.5  ALBUMIN 3.3*   Recent Labs  Lab 06/16/19 1747  LIPASE 34   No results for input(s): AMMONIA in the last 168 hours. Coagulation Profile: No results for input(s): INR, PROTIME in the last 168 hours. Cardiac  Enzymes: Recent Labs  Lab 06/16/19 1747 06/16/19 2115 06/16/19 2116 06/17/19 0207  CKTOTAL  --   --  172  --   TROPONINI 0.08* 0.08*  --  0.07*   BNP (last 3 results) No results for input(s): PROBNP in the last 8760 hours. HbA1C: Recent Labs    06/17/19 0207  HGBA1C 10.7*   CBG: Recent Labs  Lab 06/17/19 0813 06/17/19 1146 06/17/19 1546 06/17/19 2128 06/18/19 0823  GLUCAP 122* 255* 94 87 110*   Lipid Profile: No results for input(s): CHOL, HDL, LDLCALC, TRIG, CHOLHDL, LDLDIRECT in the last 72 hours. Thyroid Function Tests: Recent Labs    06/17/19 0207  TSH 0.947   Anemia Panel: Recent Labs    06/17/19 0207  VITAMINB12 280   Sepsis Labs: No results for input(s): PROCALCITON, LATICACIDVEN in the last 168 hours.  Recent Results (from the past 240 hour(s))  Novel Coronavirus,NAA,(SEND-OUT TO REF LAB - TAT 24-48 hrs); Hosp Order     Status: None   Collection Time: 06/16/19  3:28 PM   Specimen: Nasopharyngeal Swab; Respiratory  Result Value Ref  Range Status   SARS-CoV-2, NAA NOT DETECTED NOT DETECTED Final    Comment: (NOTE) Testing was performed using the cobas(R) SARS-CoV-2 test. This test was developed and its performance characteristics determined by [employer]. This test has not been FDA cleared or approved. This test has been authorized by FDA under an Emergency Use Authorization (EUA). This test is only authorized for the duration of time the declaration that circumstances exist justifying the authorization of the emergency use of in vitro diagnostic tests for detection of SARS-CoV-2 virus and/or diagnosis of COVID-19 infection under section 564(b)(1) of the Act, 21 U.S.C. 322GUR-4(Y)(7), unless the authorization is terminated or revoked sooner. When diagnostic testing is negative, the possibility of a false negative result should be considered in the context of a patient's recent exposures and the presence of clinical signs and symptoms consistent with COVID-19. An individual without symptoms of COVID-19 and who is not shedding SARS-CoV-2 virus would expect to have  a negative (not detected) result in this assay. Performed At: [facility] [place], [place] [id number] Rush Farmer MD TD:[id number]    [place]  Final    Comment: Performed at [facility] [address] [place], [place] [address]  Urine culture     Status: Abnormal   Collection Time: 06/17/19 12:47 AM   Specimen: Urine, Random  Result Value Ref Range Status   Specimen Description   Final    URINE, RANDOM Performed at [facility] [address] [place], [place] [address]    Special Requests   Final    NONE Performed at [facility], [place] [address] [place],  [address]    Culture MULTIPLE SPECIES PRESENT, SUGGEST RECOLLECTION (A)  Final   Report Status 06/18/2019 FINAL  Final  MRSA PCR Screening     Status: None    Collection Time: 06/17/19  2:11 AM   Specimen: Nasal Mucosa; Nasopharyngeal  Result Value Ref Range Status   MRSA by PCR NEGATIVE NEGATIVE Final    Comment:        The GeneXpert MRSA Assay (FDA approved for NASAL specimens only), is one component of a comprehensive MRSA colonization surveillance program. It is not intended to diagnose MRSA infection nor to guide or monitor treatment for MRSA infections. Performed at [facility], [place] [address], [place],  [address]  Radiology Studies: Ct Head Wo Contrast  Result Date: 06/16/2019 CLINICAL DATA:  Altered mental status. EXAM: CT HEAD WITHOUT CONTRAST TECHNIQUE: Contiguous axial images were obtained from the base of the skull through the vertex without intravenous contrast. COMPARISON:  05/10/2006 FINDINGS: Brain: No evidence of acute infarction, hemorrhage, hydrocephalus, extra-axial collection or mass lesion/mass effect. Chronic changes of left caudate, right thalamus and bilateral cerebellar infarcts. Moderate brain parenchymal volume loss and deep white matter microangiopathy. Vascular: Calcific atherosclerotic disease. Skull: Normal. Negative for fracture or focal lesion. Sinuses/Orbits: No acute finding. Other: None. IMPRESSION: 1. No acute intracranial abnormality. 2. Atrophy, chronic microvascular disease. 3. Chronic infarcts of the left caudate, right thalamus and bilateral cerebellar hemispheres. Electronically Signed   By: Fidela Salisbury M.D.   On: 06/16/2019 16:43   Dg Chest Port 1 View  Result Date: 06/16/2019 CLINICAL DATA:  Failure to thrive. EXAM: PORTABLE CHEST 1 VIEW COMPARISON:  05/07/2016 FINDINGS: Patient is moderately rotated to the left. Lungs are hypoinflated without focal airspace consolidation, effusion or pneumothorax. Cardiomediastinal silhouette and remainder of the exam is unchanged. IMPRESSION: Hypoinflation without acute cardiopulmonary disease. Electronically Signed   By:  Marin Olp M.D.   On: 06/16/2019 14:31      Scheduled Meds: . Chlorhexidine Gluconate Cloth  6 each Topical Daily  . clopidogrel  75 mg Oral Daily  . clotrimazole   Topical BID  . heparin  5,000 Units Subcutaneous Q8H  . insulin aspart  0-5 Units Subcutaneous QHS  . insulin aspart  0-9 Units Subcutaneous TID WC  . simvastatin  20 mg Oral q1800  . sodium chloride flush  3 mL Intravenous Q12H   Continuous Infusions: . lactated ringers Stopped (06/18/19 0348)     LOS: 1 day    Time spent: 25 minutes   Dessa Phi, DO [facility] [email or web address] 06/18/2019, 10:18 AM

## 2019-06-18 NOTE — Evaluation (Addendum)
Physical Therapy Evaluation Patient Details Name: Angela Hays MRN: [id number] DOB: [birth year] Today's Date: 06/18/2019   History of Present Illness  Angela Hays is a 68 y.o. female with medical history significant for insulin-dependent diabetes, hypertension, and history of CVA, now presenting to the emergency department 06/16/19 for evaluation of worsening lethargy, not eating, and hypotension.PTA WC bound. Lived with son  Clinical Impression  The patient  Has long term painful contractures of legs per previous epic notes from PT. Per patient, resides with son, is home alone at times , son assists with transfer to [facility]. Patient sat at bed edge with total assist. HR 121 to 144. BP 106/84. Patient demonstrates no sitting balance. PT goals are limited at this time. Per Palliative  Consult, son plans for patient to return home. Continue trial PT for DC planning/needs. Pt admitted with above diagnosis. Pt currently with functional limitations due to the deficits listed below (see PT Problem List).  Pt will benefit from skilled PT to increase their independence and safety with mobility to allow discharge to the venue listed below.       Follow Up Recommendations SNF vs retun home with son.    Equipment Recommendations  None recommended by PT    Recommendations for Other Services       Precautions / Restrictions Precautions Precautions: Fall Precaution Comments: both Leg s contracted in flexion, HR into 140'. BP soft. very painful to move the legs, has pressure sores on  both hips and sacrum      Mobility  Bed Mobility Overal bed mobility: Needs Assistance Bed Mobility: Sidelying to Sit;Sit to Sidelying   Sidelying to sit: Max assist;HOB elevated     Sit to sidelying: Max assist General bed mobility comments: patient did initiate to  move the legs toward the bed edge. Assist with trunk and place legs over bed edge. then total with trunk and legs to return to sidelying. patient yells  out and states, Don't hurt me"  Transfers                 General transfer comment: NT  Ambulation/Gait                Stairs            Wheelchair Mobility    Modified Rankin (Stroke Patients Only)       Balance Overall balance assessment: Needs assistance Sitting-balance support: No upper extremity supported Sitting balance-Leahy Scale: Zero Sitting balance - Comments: no balance sitting Postural control: Right lateral lean                                   Pertinent Vitals/Pain Pain Assessment: Faces Faces Pain Scale: Hurts whole lot Pain Location: when moving legs Pain Descriptors / Indicators: Discomfort;Grimacing;Moaning Pain Intervention(s): Monitored during session;Limited activity within patient's tolerance    Home Living Family/patient expects to be discharged to:: Private residence Living Arrangements: Children Available Help at Discharge: Family Type of Home: House Home Access: Level entry     Home Layout: One level Home Equipment: Wheelchair - manual;Bedside commode Additional Comments: pt reports lives with son,    Prior Function Level of Independence: Needs assistance   Gait / Transfers Assistance Needed: non ambulatory for many years. reports son places in [facility], stays up while son workks and and he checks on her. Son  unavailable to clarify.  ADL's / Homemaking Assistance Needed: states  son washes her back she washes private parts.        Hand Dominance        Extremity/Trunk Assessment   Upper Extremity Assessment Upper Extremity Assessment: LUE deficits/detail;RUE deficits/detail RUE Deficits / Details: able to hold cup and drink from a cup LUE Deficits / Details: decreased elbow extension, decreased finger extension.    Lower Extremity Assessment Lower Extremity Assessment: RLE deficits/detail;LLE deficits/detail RLE Deficits / Details: contractures at hip and knee at about 45* is able to actively  flex and extend knee a small amount LLE Deficits / Details: contractures at the hip and knee at near 90 at hip.       Communication      Cognition Arousal/Alertness: Awake/alert Behavior During Therapy: WFL for tasks assessed/performed Overall Cognitive Status: No family/caregiver present to determine baseline cognitive functioning Area of Impairment: Orientation                 Orientation Level: Time             General Comments: oriented to [facility], follows directions      General Comments      Exercises     Assessment/Plan    PT Assessment Patient needs continued PT services  PT Problem List Decreased strength;Decreased mobility;Decreased range of motion;Decreased activity tolerance;Decreased balance;Decreased knowledge of use of DME;Cardiopulmonary status limiting activity;Pain;Decreased skin integrity       PT Treatment Interventions Functional mobility training;Therapeutic activities;Patient/family education    PT Goals (Current goals can be found in the Care Plan section)  Acute Rehab PT Goals Patient Stated Goal: to go home PT Goal Formulation: With patient Time For Goal Achievement: 07/02/19 Potential to Achieve Goals: Fair    Frequency Min 2X/week   Barriers to discharge        Co-evaluation               AM-PAC PT "6 Clicks" Mobility  Outcome Measure Help needed turning from your back to your side while in a flat bed without using bedrails?: Total Help needed moving from lying on your back to sitting on the side of a flat bed without using bedrails?: Total Help needed moving to and from a bed to a chair (including a wheelchair)?: Total Help needed standing up from a chair using your arms (e.g., wheelchair or bedside chair)?: Total Help needed to walk in hospital room?: Total Help needed climbing 3-5 steps with a railing? : Total 6 Click Score: 6    End of Session   Activity Tolerance: Treatment limited secondary to medical  complications (Comment) Patient left: in bed;with call bell/phone within reach Nurse Communication: Mobility status PT Visit Diagnosis: Muscle weakness (generalized) (M62.81)    Time: 3335-4562 PT Time Calculation (min) (ACUTE ONLY): 25 min   Charges:   PT Evaluation $PT Eval Low Complexity: 1 Low PT Treatments $Therapeutic Activity: 8-22 mins        [place] Pager [phone] Office [phone]   Claretha Cooper 06/18/2019, 3:57 PM

## 2019-06-18 NOTE — Consult Note (Signed)
Consultation Note Date: 06/18/2019   Patient Name: Angela Hays  DOB: 10/15/1951  MRN: [id number]  Age / Sex: 68 y.o., female  PCP: Maury Dus, MD Referring Physician: Dessa Phi, DO  Reason for Consultation: Establishing goals of care  HPI/Patient Profile: 69 y.o. female  admitted on 06/16/2019    Clinical Assessment and Goals of Care: 68 year old lady who lives at home with her son Angela Hays.  At baseline, she is wheelchair dependent.  Patient has a past medical history significant for diabetes, hypertension, history of cerebrovascular accident.  It is noted that the patient had worsening lethargy, not eating and low blood pressure for which EMS was called, patient was brought into the emergency department for further evaluation.  Patient was admitted to hospital medicine service for hyperkalemia, acute on chronic stage III kidney disease, low blood pressures, chronic diastolic congestive heart failure.  Additionally with acute metabolic encephalopathy, left hip posterior thigh wound present on arrival.  A palliative consultation has been requested for goals of care discussions.  Patient is resting in bed.  She is a little hard of hearing.  However, she opens her eyes and engages and is able to verbalize freely with me.  She provides her own history.  She states that she lives at home with her son and uses a wheelchair to get around.  When asked why she is in the hospital she states that she was sleeping a lot and not eating and also had abdominal discomfort.  She states she is feeling much better.  Patient is very polite and answers mostly with single word answers.  She does not appear to be in distress.  She does appear chronically ill and with generalized weakness.  She does have contractures.  Call placed and discussed again with son Angela Hays over the phone.  He states that he is regularly in touch with the  patient over the phone and is thankful that she is feeling better.  He states that she is beginning to sound like her usual self.  He got concerned when she started appearing more lethargic and was not eating well.  Patient's son Angela Hays is thankful for the patient's gradual improvement in this hospitalization thus far.  He hopes that the patient will continue to show signs of improvement.  He is eager to have her back home towards the end of this hospitalization.  Patient wishes to go home back to her son towards the end of this hospitalization.  Please note additional discussions/recommendations as listed below.  Thank you for the consult.  NEXT OF KIN  lives at home with son Angela Hays who is her HCPOA agent and primary caregiver.   SUMMARY OF RECOMMENDATIONS    Full code full scope Home with home health care, home PT and out patient palliative care on discharge is recommended. Discussed with both patient and son over the phone.  Continue current mode of care. Thank you for the consult.   Code Status/Advance Care Planning:  Full code    Symptom Management:  Continue current mode of care.  Palliative Prophylaxis:   Delirium Protocol  Additional Recommendations (Limitations, Scope, Preferences):  Full Scope Treatment  Psycho-social/Spiritual:   Desire for further Chaplaincy support:yes  Additional Recommendations: Caregiving  Support/Resources  Prognosis:   Unable to determine  Discharge Planning: Home with Home Health      Primary Diagnoses: Present on Admission: . Hyperkalemia . DM (diabetes mellitus), type 2 with peripheral vascular complications ([place]) . Hyponatremia . Metabolic acidosis . Chronic diastolic CHF (congestive heart failure) ([place]) . Hypotension due to hypovolemia . Elevated troponin . Candidal intertrigo . AKI (acute kidney injury) ([place])   I have reviewed the medical record, interviewed the patient and family, and examined the patient. The  following aspects are pertinent.  Past Medical History:  Diagnosis Date  . Diabetes mellitus without complication ([place])   . Hypertension   . Stroke [facility])    Social History   Socioeconomic History  . Marital status: Divorced    Spouse name: Not on file  . Number of children: Not on file  . Years of education: Not on file  . Highest education level: Not on file  Occupational History  . Not on file  Social Needs  . Financial resource strain: Not on file  . Food insecurity    Worry: Not on file    Inability: Not on file  . Transportation needs    Medical: Not on file    Non-medical: Not on file  Tobacco Use  . Smoking status: Former Research scientist (life sciences)  . Smokeless tobacco: Never Used  Substance and Sexual Activity  . Alcohol use: No  . Drug use: No  . Sexual activity: Not on file  Lifestyle  . Physical activity    Days per week: Not on file    Minutes per session: Not on file  . Stress: Not on file  Relationships  . Social Herbalist on phone: Not on file    Gets together: Not on file    Attends religious service: Not on file    Active member of club or organization: Not on file    Attends meetings of clubs or organizations: Not on file    Relationship status: Not on file  Other Topics Concern  . Not on file  Social History Narrative  . Not on file   History reviewed. No pertinent family history. Scheduled Meds: . Chlorhexidine Gluconate Cloth  6 each Topical Daily  . clopidogrel  75 mg Oral Daily  . clotrimazole   Topical BID  . heparin  5,000 Units Subcutaneous Q8H  . insulin aspart  0-5 Units Subcutaneous QHS  . insulin aspart  0-9 Units Subcutaneous TID WC  . simvastatin  20 mg Oral q1800  . sodium chloride flush  3 mL Intravenous Q12H   Continuous Infusions: . lactated ringers 100 mL/hr at 06/18/19 1101   PRN Meds:.acetaminophen **OR** acetaminophen, fentaNYL (SUBLIMAZE) injection, HYDROcodone-acetaminophen, lip balm, ondansetron **OR** ondansetron  (ZOFRAN) IV Medications Prior to Admission:  Prior to Admission medications   Medication Sig Start Date End Date Taking? Authorizing Provider  amLODipine (NORVASC) 5 MG tablet Take 5 mg by mouth every morning. 05/28/19  Yes [provider]  cloNIDine (CATAPRES) 0.2 MG tablet Take 0.2 mg by mouth 4 (four) times daily. 08/07/18  Yes [provider]  clopidogrel (PLAVIX) 75 MG tablet Take 75 mg by mouth daily.   Yes [provider]  doxazosin (CARDURA) 4 MG tablet Take 4 mg by mouth at bedtime.  05/17/19  Yes [provider]  furosemide (LASIX) 40 MG tablet Take 0.5 tablets (20 mg total) by mouth daily as needed for fluid or edema (for weight gain of more then 2 Lbs in 1 day). Patient taking differently: Take 80 mg by mouth daily.  05/15/16  Yes Lavina Hamman, MD  Insulin Glargine (BASAGLAR KWIKPEN) 100 UNIT/ML SOPN Inject 15 Units into the skin 2 (two) times daily.  07/09/18  Yes [provider]  KLOR-CON M20 20 MEQ tablet Take 20 mEq by mouth daily. 05/24/18  Yes [provider]  labetalol (NORMODYNE) 100 MG tablet Take 100 mg by mouth 2 (two) times daily. 06/06/18  Yes [provider]  simvastatin (ZOCOR) 20 MG tablet Take 20 mg by mouth daily with breakfast.   Yes [provider]  DUREZOL 0.05 % EMUL Place 1 drop into the right eye as directed. Instill 1 drop BID IN OPERATIVE EYE Starting 2 Days Prior to Surgery and After Surgery for 3 Weeks 06/04/19   [provider]  ofloxacin (OCUFLOX) 0.3 % ophthalmic solution Place 1 drop into the right eye as directed. Instill 1 drop BID IN OPERATIVE EYE Starting 2 Days Prior to Surgery and After Surgery for 3 Weeks 06/04/19   [provider]   No Known Allergies Review of Systems Denies pain Had abdominal pain earlier  Physical Exam Asleep in bed Awakens easily No distress Exophthalmos is noted Contractures No edema Slow to speak, but is able to converse freely with  me.   Vital Signs: BP (!) 95/54 (BP Location: Left Arm)   Pulse (!) 131   Temp 98.7 F (37.1 C) (Oral)   Resp (!) 24   Ht 5\' 3"  (1.6 m)   Wt 71 kg   SpO2 98%   BMI 27.73 kg/m  Pain Scale: PAINAD       SpO2: SpO2: 98 % O2 Device:SpO2: 98 % O2 Flow Rate: .   IO: Intake/output summary:   Intake/Output Summary (Last 24 hours) at 06/18/2019 1205 Last data filed at 06/18/2019 0600 Gross per 24 hour  Intake 1996.1 ml  Output 1550 ml  Net 446.1 ml    LBM: Last BM Date: 06/16/01 Baseline Weight: Weight: (bed would not weigh ) Most recent weight: Weight: 71 kg     Palliative Assessment/Data:   Flowsheet Rows     Most Recent Value  Intake Tab  Referral Department  Hospitalist  Unit at Time of Referral  Intermediate Care Unit  Palliative Care Primary Diagnosis  Nephrology  Palliative Care Type  New Palliative care  Reason for referral  Clarify Goals of Care  Date first seen by Palliative Care  06/18/19  Clinical Assessment  Palliative Performance Scale Score  40%  Pain Max last 24 hours  5  Pain Min Last 24 hours  4  Dyspnea Max Last 24 Hours  4  Dyspnea Min Last 24 hours  3  Psychosocial & Spiritual Assessment  Palliative Care Outcomes      Time In:  10 Time Out:  11 Time Total:  60 min  Greater than 50%  of this time was spent counseling and coordinating care related to the above assessment and plan.  Signed by: Loistine Chance, MD [phone]  Please contact Palliative Medicine Team phone at [phone] for questions and concerns.  For individual provider: See Shea Evans

## 2019-06-19 LAB — GLUCOSE, CAPILLARY
Glucose-Capillary: 150 mg/dL — ABNORMAL HIGH (ref 70–99)
Glucose-Capillary: 194 mg/dL — ABNORMAL HIGH (ref 70–99)
Glucose-Capillary: 180 mg/dL — ABNORMAL HIGH (ref 70–99)
Glucose-Capillary: 152 mg/dL — ABNORMAL HIGH (ref 70–99)
Glucose-Capillary: 164 mg/dL — ABNORMAL HIGH (ref 70–99)

## 2019-06-19 LAB — BASIC METABOLIC PANEL
Anion gap: 15 (ref 5–15)
BUN: 68 mg/dL — ABNORMAL HIGH (ref 8–23)
CO2: 23 mmol/L (ref 22–32)
Calcium: 7.3 mg/dL — ABNORMAL LOW (ref 8.9–10.3)
Chloride: 101 mmol/L (ref 98–111)
Creatinine, Ser: 1.29 mg/dL — ABNORMAL HIGH (ref 0.44–1.00)
GFR calc Af Amer: 49 mL/min — ABNORMAL LOW (ref >60)
GFR calc non Af Amer: 42 mL/min — ABNORMAL LOW (ref >60)
Glucose, Bld: 152 mg/dL — ABNORMAL HIGH (ref 70–99)
Potassium: 3.1 mmol/L — ABNORMAL LOW (ref 3.5–5.1)
Sodium: 139 mmol/L (ref 135–145)

## 2019-06-19 LAB — CBC
HCT: 40.2 % (ref 36.0–46.0)
Hemoglobin: 13.3 g/dL (ref 12.0–15.0)
MCH: 29 pg (ref 26.0–34.0)
MCHC: 33.1 g/dL (ref 30.0–36.0)
MCV: 87.6 fL (ref 80.0–100.0)
Platelets: 237 10*3/uL (ref 150–400)
RBC: 4.59 MIL/uL (ref 3.87–5.11)
RDW: 14.6 % (ref 11.5–15.5)
WBC: 10.3 10*3/uL (ref 4.0–10.5)
nRBC: 0 % (ref 0.0–0.2)

## 2019-06-19 LAB — D-DIMER, QUANTITATIVE: D-Dimer, Quant: 0.71 ug/mL-FEU — ABNORMAL HIGH (ref 0.00–0.50)

## 2019-06-19 MED ORDER — POTASSIUM CHLORIDE CRYS ER 20 MEQ PO TBCR
40.0000 meq | EXTENDED_RELEASE_TABLET | Freq: Once | ORAL | Status: AC
  Administered 2019-06-19: 40 meq via ORAL
  Filled 2019-06-19: qty 2

## 2019-06-19 NOTE — Progress Notes (Signed)
Daily Progress Note   Patient Name: Angela Hays       Date: 06/19/2019 DOB: 1951-01-28  Age: 68 y.o. MRN#: [id number] Attending Physician: Alma Friendly, MD Primary Care Physician: Maury Dus, MD Admit Date: 06/16/2019  Reason for Consultation/Follow-up: Establishing goals of care  Subjective:  patient is awake, resting in bed. She is on the phone with her son, appears in no distress, appears to be conversing normally Discussed with bedside RN, See below.   Length of Stay: 2  Current Medications: Scheduled Meds:  . Chlorhexidine Gluconate Cloth  6 each Topical Daily  . clopidogrel  75 mg Oral Daily  . clotrimazole   Topical BID  . heparin  5,000 Units Subcutaneous Q8H  . insulin aspart  0-5 Units Subcutaneous QHS  . insulin aspart  0-9 Units Subcutaneous TID WC  . simvastatin  20 mg Oral q1800  . sodium chloride flush  3 mL Intravenous Q12H    Continuous Infusions: . lactated ringers 100 mL/hr at 06/19/19 0644    PRN Meds: acetaminophen **OR** acetaminophen, fentaNYL (SUBLIMAZE) injection, HYDROcodone-acetaminophen, lip balm, ondansetron **OR** ondansetron (ZOFRAN) IV  Physical Exam         Appears frail Has contractures Work of breathing doesn't appear labored Patient able to speak in full sentences on the phone No edema Non focal Abdomen not distended  Vital Signs: BP 116/64   Pulse (!) 131   Temp 99.4 F (37.4 C) (Oral)   Resp (!) 24   Ht 5\' 3"  (1.6 m)   Wt 71 kg   SpO2 100%   BMI 27.73 kg/m  SpO2: SpO2: 100 % O2 Device: O2 Device: Room Air O2 Flow Rate:    Intake/output summary:   Intake/Output Summary (Last 24 hours) at 06/19/2019 1229 Last data filed at 06/19/2019 0500 Gross per 24 hour  Intake 2493.02 ml  Output 1102 ml  Net 1391.02 ml    LBM: Last BM Date: 06/17/19 Baseline Weight: Weight: (bed would not weigh ) Most recent weight: Weight: 71 kg       Palliative Assessment/Data:    Flowsheet Rows     Most Recent Value  Intake Tab  Referral Department  Hospitalist  Unit at Time of Referral  Intermediate Care Unit  Palliative Care Primary Diagnosis  Nephrology  Palliative Care Type  New Palliative care  Reason for referral  Clarify Goals of Care  Date first seen by Palliative Care  06/18/19  Clinical Assessment  Palliative Performance Scale Score  40%  Pain Max last 24 hours  5  Pain Min Last 24 hours  4  Dyspnea Max Last 24 Hours  4  Dyspnea Min Last 24 hours  3  Psychosocial & Spiritual Assessment  Palliative Care Outcomes      Patient Active Problem List   Diagnosis Date Noted  . Weakness   . Palliative care by specialist   . Goals of care, counseling/discussion   . Pressure injury of skin 06/17/2019  . Hyperkalemia 06/16/2019  . Renal insufficiency 06/16/2019  . Hyponatremia 06/16/2019  . Metabolic acidosis 36/46/8032  . Chronic diastolic CHF (congestive heart failure) ([place]) 06/16/2019  . Hypotension due to hypovolemia 06/16/2019  . Elevated troponin 06/16/2019  . Candidal intertrigo 06/16/2019  . AKI (acute kidney injury) ([place])   . ARF (acute renal failure) ([place]) 05/11/2016  . Leg ulcer ([place]) 05/11/2016  . Cellulitis and abscess of leg 05/06/2016  . Sepsis ([place]) 01/05/2016  . Pressure ulcer 01/05/2016  . Leg swelling 01/05/2016  . DM (diabetes mellitus), type 2 with peripheral vascular complications ([place]) 12/18/8249  . Hypertensive urgency 01/05/2016    Palliative Care Assessment & Plan   Patient Profile:    Assessment: AKI with III CKD Hyperkalemia Hypotension Weakness Contractures frailty  history of stroke Functional decline at home.   Recommendations/Plan:   as per initial goals of care discussions, both patient and son expressed preference for home with home  health/home based PT on discharge. Will request CSW input for additional discussions regarding appropriate disposition.  Continue current mode of care, encourage PO and OOB as much as possible.   No additional PMT specific recommendations at this time.    Code Status:    Code Status Orders  (From admission, onward)         Start     Ordered   06/17/19 0047  Full code  Continuous     06/17/19 0047        Code Status History    Date Active Date Inactive Code Status Order ID Comments User Context   05/06/2016 2136 05/15/2016 1738 Full Code [id number]  Marshell Garfinkel, MD ED   01/05/2016 1823 01/07/2016 1514 Full Code [id number]  Theodis Blaze, MD Inpatient   Advance Care Planning Activity       Prognosis:   Unable to determine  Discharge Planning:  Home with [facility] rehab with palliative.   Care plan was discussed with RN and PT  Thank you for allowing the Palliative Medicine Team to assist in the care of this patient.   Time In: 9 Time Out: 9.25 Total Time 25 Prolonged Time Billed  no       Greater than 50%  of this time was spent counseling and coordinating care related to the above assessment and plan.  Loistine Chance, MD [phone] Please contact Palliative Medicine Team phone at [phone] for questions and concerns.

## 2019-06-19 NOTE — TOC Initial Note (Addendum)
Transition of Care [facility]) - Initial/Assessment Note    Patient Details  Name: Angela Hays MRN: [id number] Date of Birth: 04-12-1951  Transition of Care [facility]) CM/SW Contact:    Angela Hays, Angela Skiff, RN Phone Number: 06/19/2019, 12:52 PM  Clinical Narrative:                 Per both pt and son Angela Hays, plan is for pt to dc back home with him.  Expected Discharge Plan: [place] Barriers to Discharge: Continued Medical Work up   Patient Goals and CMS Choice   CMS Medicare.gov Compare Post Acute Care list provided to:: Patient Choice offered to / list presented to : Patient  Expected Discharge Plan and Services Expected Discharge Plan: [place]   Discharge Planning Services: CM Consult Post Acute Care Choice: [place] arrangements for the past 2 months: Single Family Home Expected Discharge Date: (unknown)                         HH Arranged: RN, PT, Nurse's Aide [place] Agency: [place] ([place]) Date [place]: 06/19/19 Time [place]: 1200 Representative spoke with at [place]: Angela Hays  Prior Living Arrangements/Services Living arrangements for the past 2 months: [place] Lives with:: Adult Children Patient language and need for interpreter reviewed:: Yes Do you feel safe going back to the place where you live?: Yes      Need for Family Participation in Patient Care: Yes (Comment) Care giver support system in place?: Yes (comment)      Activities of Daily Living Home Assistive Devices/Equipment: CBG Meter, Cane (specify quad or straight), Eyeglasses, Walker (specify type), Bedside commode/3-in-1, Wheelchair(front wheeled walker, single point cane) ADL Screening (condition at time of admission) Patient's cognitive ability adequate to safely complete daily activities?: No Is the patient deaf or have difficulty hearing?: No Does the patient have difficulty seeing, even when wearing  glasses/contacts?: No Does the patient have difficulty concentrating, remembering, or making decisions?: Yes Patient able to express need for assistance with ADLs?: Yes Does the patient have difficulty dressing or bathing?: Yes Independently performs ADLs?: No Communication: Independent Dressing (OT): Dependent Is this a change from baseline?: Change from baseline, expected to last >3 days Grooming: Dependent Is this a change from baseline?: Change from baseline, expected to last >3 days Feeding: Dependent Is this a change from baseline?: Change from baseline, expected to last >3 days Bathing: Dependent Is this a change from baseline?: Change from baseline, expected to last >3 days Toileting: Dependent Is this a change from baseline?: Change from baseline, expected to last >3days In/Out Bed: Dependent Is this a change from baseline?: Change from baseline, expected to last >3 days Walks in Home: Dependent Is this a change from baseline?: Change from baseline, expected to last >3 days Does the patient have difficulty walking or climbing stairs?: Yes Weakness of Legs: Both Weakness of Arms/Hands: Both  Permission Sought/Granted Permission sought to share information with : Angela Hays granted to share information with : Yes, Verbal Permission Granted     Permission granted to share info w AGENCY: [facility]        Emotional Assessment Appearance:: Appears older than stated age     Orientation: : Oriented to Self, Oriented to Place, Oriented to  Time, Oriented to Situation      Admission diagnosis:  Dehydration [E86.0] Hyperkalemia [E87.5] Weakness [R53.1] AKI (acute  kidney injury) [facility]) [N17.9] Patient Active Problem List   Diagnosis Date Noted  . Weakness   . Palliative care by specialist   . Goals of care, counseling/discussion   . Pressure injury of skin 06/17/2019  . Hyperkalemia 06/16/2019  . Renal insufficiency 06/16/2019  .  Hyponatremia 06/16/2019  . Metabolic acidosis 50/93/2671  . Chronic diastolic CHF (congestive heart failure) ([place]) 06/16/2019  . Hypotension due to hypovolemia 06/16/2019  . Elevated troponin 06/16/2019  . Candidal intertrigo 06/16/2019  . AKI (acute kidney injury) ([place])   . ARF (acute renal failure) ([place]) 05/11/2016  . Leg ulcer ([place]) 05/11/2016  . Cellulitis and abscess of leg 05/06/2016  . Sepsis ([place]) 01/05/2016  . Pressure ulcer 01/05/2016  . Leg swelling 01/05/2016  . DM (diabetes mellitus), type 2 with peripheral vascular complications ([place]) 24/58/0998  . Hypertensive urgency 01/05/2016   PCP:  Angela Dus, MD Pharmacy:   [place], [place] - [address] [place] [address] Phone: [phone] Fax: [phone]     Social Determinants of Health (SDOH) Interventions    Readmission Risk Interventions No flowsheet data found.

## 2019-06-19 NOTE — Progress Notes (Signed)
PROGRESS NOTE    Angela Hays  BOF:[id number] DOB: 26-Apr-1951 DOA: 06/16/2019 PCP: Maury Dus, MD     Brief Narrative:  Angela Hays is a 68 y.o. female with medical history significant for insulin-dependent diabetes, hypertension, and history of CVA, now presenting to the emergency department for evaluation of worsening lethargy, not eating, and hypotension.  Patient's family reports that the patient has been more lethargic over the past couple days, has not eaten anything in 2 days, but has continued to drink some fluids.  She has continued to take her medications as prescribed.  Patient has not complained of anything in particular per report of family.  At baseline, she is alert and talkative.  EMS was called out, patient was found to have systolic pressure in the 77O, and she was given 500 cc of IV fluids prior to arrival in the ED. Upon arrival to the ED, patient is found to be afebrile, saturating adequately on room air, and with blood pressure 90/47.  EKG features sinus rhythm with LVH and repolarization abnormality.  Chest x-ray is notable for hypoinflation without acute cardiopulmonary disease.  Noncontrast head CT reveals chronic infarctions but no acute intracranial abnormality.  Chemistry panel is concerning for potassium of 7.1, bicarbonate of 13, and creatinine 2.24 with unclear baseline but up from 1.50 a year ago.  CBC features a leukocytosis to 18,900 with a mild polycythemia.  BNP is slightly elevated 250 and troponin to 0.08.  Patient was treated with albuterol, IV calcium, insulin with dextrose, bicarbonate, and 1 L of normal saline in the ED.  Nephrology was consulted by the ED physician, suspects this is a prerenal injury and anticipates rapid improvement in the potassium, recommending bicarbonate infusion and medical admission.  New events last 24 hours / Subjective: Patient currently denies any new complaints, knows where she is and her name. Patient still noted to be  tachycardic  Assessment & Plan:   Principal Problem:   Hyperkalemia Active Problems:   DM (diabetes mellitus), type 2 with peripheral vascular complications (HCC)   Renal insufficiency   Hyponatremia   Metabolic acidosis   Chronic diastolic CHF (congestive heart failure) (HCC)   Hypotension due to hypovolemia   Elevated troponin   Candidal intertrigo   AKI (acute kidney injury) ([place])   Pressure injury of skin   Weakness   Palliative care by specialist   Goals of care, counseling/discussion   AKI on CKD stage III Improving -Presented with decreased oral intake, progressive lethargy -Baseline creatinine around 1.5 -Presented with Cr 2.24 --> 1.86 --> 1.37 with IVF -Likely prerenal azotemia in setting of anorexia, hypotension  SinusTachycardia EKG with sinus tachycardia, occasional PVCs D-dimer elevated at 0.71 Unable to do VQ scan due to inability of patient to lie still due to multiple contractures.  To do CTA chest due to CKD Plan to restart her labetalol at a lower dose, once BP permits  Hyperkalemia Resolved, now hypokalemic -Replace PRN  Hypotension; history of HTN  Improving -SBP was 80's with EMS, 90 on arrival to ED after 500 cc NS with EMS  -Likely secondary to hypovolemia and continued use of her multiple antihypertensives (norvasc, catapres, cardura, lasix, labetalol) -Hold antihypertensives, diuretic at this time -May plan to restart lower dose of labetalol due to tachycardia, pending BP  Chronic diastolic CHF  -BNP is slightly elevated in ED, but patient is hypovolemic in setting of anorexia with continued diuretic use  -Continue to hold Lasix  Insulin-dependent DM; hypoglycemia  -Ha1c  10.7  -Sliding-scale insulin  Elevated troponin  -Demand ischemia   Acute metabolic encephalopathy  Improving -Apparently at baseline, alert and conversant but often confuesd at baseline, now presenting with 2 days of anorexia and lethargy   -Head CT is negative  for acute findings    History of right-sided CVA  -No acute findings on CT head  -With chronic hemiparesis of the left upper extremity and bilateral lower extremity contractures -Continue Zocor and Plavix    Intertrigo  -There is erythema about the diaper and intertriginous areas with maceration, erosions, and excoriations  -Topical clotrimazole    Left hip, posterior thigh wound, POA, not pressure injury related  -Appreciate Wound RN consult   Goals of care -Patient presents with anorexia, weakness, confusion, chronic contractures. She is apparently blind and is scheduled for cataract surgery as outpatient. Per son, he states that she is alert and conversational at baseline and paints a picture of a healthy individual. -Palliative consulted   DVT prophylaxis: Subq hep  Code Status: Full Family Communication: None Disposition Plan: Home once significant improvement with home health therapy   Consultants:   Palliative care  Procedures:   None   Antimicrobials:  Anti-infectives (From admission, onward)   None       Objective: Vitals:   06/19/19 0900 06/19/19 1000 06/19/19 1100 06/19/19 1200  BP: 129/80 105/60 116/64   Pulse:      Resp: (!) 21 (!) 23 (!) 24   Temp:    99.4 F (37.4 C)  TempSrc:    Oral  SpO2:      Weight:      Height:        Intake/Output Summary (Last 24 hours) at 06/19/2019 1352 Last data filed at 06/19/2019 1300 Gross per 24 hour  Intake 2773.37 ml  Output 1100 ml  Net 1673.37 ml   Filed Weights   06/18/19 0747  Weight: 71 kg   Examination:  General: NAD   Cardiovascular: S1, S2 present  Respiratory: CTAB  Abdomen: Soft, nontender, nondistended, bowel sounds present  Musculoskeletal: No bilateral pedal edema noted, ++ contractures  Skin:  Posterior thigh wound  Psychiatry: Normal mood   Data Reviewed: I have personally reviewed following labs and imaging studies  CBC: Recent Labs  Lab 06/16/19 1458 06/17/19 0207  06/18/19 0222 06/19/19 0245  WBC 18.9* 13.7* 11.8* 10.3  NEUTROABS 16.9* 11.9*  --   --   HGB 16.1* 14.5 14.6 13.3  HCT 49.4* 42.1 42.3 40.2  MCV 88.5 85.4 84.8 87.6  PLT 301 252 252 329   Basic Metabolic Panel: Recent Labs  Lab 06/16/19 1747 06/16/19 2105 06/17/19 0207 06/17/19 0955 06/18/19 0222 06/19/19 0245  NA 132* 131* 130*  --  140 139  K 7.1* 5.9* 5.5*  5.4* 4.3 3.3* 3.1*  CL 107 103 99  --  101 101  CO2 13* 16* 18*  --  26 23  GLUCOSE 62* 205* 139*  --  95 152*  BUN 113* 136* 165*  --  101* 68*  CREATININE 2.24* 2.06* 1.86*  --  1.37* 1.29*  CALCIUM 8.3* 9.6 8.7*  --  8.0* 7.3*   GFR: Estimated Creatinine Clearance: 38.3 mL/min (A) (by C-G formula based on SCr of 1.29 mg/dL (H)). Liver Function Tests: Recent Labs  Lab 06/16/19 1747  AST 22  ALT 17  ALKPHOS 92  BILITOT 0.7  PROT 6.5  ALBUMIN 3.3*   Recent Labs  Lab 06/16/19 1747  LIPASE 34   No  results for input(s): AMMONIA in the last 168 hours. Coagulation Profile: No results for input(s): INR, PROTIME in the last 168 hours. Cardiac Enzymes: Recent Labs  Lab 06/16/19 1747 06/16/19 2115 06/16/19 2116 06/17/19 0207  CKTOTAL  --   --  172  --   TROPONINI 0.08* 0.08*  --  0.07*   BNP (last 3 results) No results for input(s): PROBNP in the last 8760 hours. HbA1C: Recent Labs    06/17/19 0207  HGBA1C 10.7*   CBG: Recent Labs  Lab 06/18/19 1636 06/18/19 2120 06/19/19 0748 06/19/19 0938 06/19/19 1202  GLUCAP 84 163* 150* 194* 180*   Lipid Profile: No results for input(s): CHOL, HDL, LDLCALC, TRIG, CHOLHDL, LDLDIRECT in the last 72 hours. Thyroid Function Tests: Recent Labs    06/17/19 0207  TSH 0.947   Anemia Panel: Recent Labs    06/17/19 0207  VITAMINB12 280   Sepsis Labs: No results for input(s): PROCALCITON, LATICACIDVEN in the last 168 hours.  Recent Results (from the past 240 hour(s))  Novel Coronavirus,NAA,(SEND-OUT TO REF LAB - TAT 24-48 hrs); Hosp Order      Status: None   Collection Time: 06/16/19  3:28 PM   Specimen: Nasopharyngeal Swab; Respiratory  Result Value Ref Range Status   SARS-CoV-2, NAA NOT DETECTED NOT DETECTED Final    Comment: (NOTE) Testing was performed using the cobas(R) SARS-CoV-2 test. This test was developed and its performance characteristics determined by [employer]. This test has not been FDA cleared or approved. This test has been authorized by FDA under an Emergency Use Authorization (EUA). This test is only authorized for the duration of time the declaration that circumstances exist justifying the authorization of the emergency use of in vitro diagnostic tests for detection of SARS-CoV-2 virus and/or diagnosis of COVID-19 infection under section 564(b)(1) of the Act, 21 U.S.C. 481EHU-3(J)(4), unless the authorization is terminated or revoked sooner. When diagnostic testing is negative, the possibility of a false negative result should be considered in the context of a patient's recent exposures and the presence of clinical signs and symptoms consistent with COVID-19. An individual without symptoms of COVID-19 and who is not shedding SARS-CoV-2 virus would expect to have  a negative (not detected) result in this assay. Performed At: [facility] [place], [place] [id number] Rush Farmer MD YI:[id number]    [place]  Final    Comment: Performed at [facility] [address] [place], [place] [address]  Urine culture     Status: Abnormal   Collection Time: 06/17/19 12:47 AM   Specimen: Urine, Random  Result Value Ref Range Status   Specimen Description   Final    URINE, RANDOM Performed at [facility] [address] [place], [place] [address]    Special Requests   Final    NONE Performed at [facility], [place] [address], [place],  [address]    Culture MULTIPLE SPECIES PRESENT,  SUGGEST RECOLLECTION (A)  Final   Report Status 06/18/2019 FINAL  Final  MRSA PCR Screening     Status: None   Collection Time: 06/17/19  2:11 AM   Specimen: Nasal Mucosa; Nasopharyngeal  Result Value Ref Range Status   MRSA by PCR NEGATIVE NEGATIVE Final    Comment:        The GeneXpert MRSA Assay (FDA approved for NASAL specimens only), is one component of a comprehensive MRSA colonization surveillance program. It is not intended to diagnose MRSA infection  nor to guide or monitor treatment for MRSA infections. Performed at [facility], [place] [address], [place], [place] [address]        Radiology Studies: No results found.    Scheduled Meds: . Chlorhexidine Gluconate Cloth  6 each Topical Daily  . clopidogrel  75 mg Oral Daily  . clotrimazole   Topical BID  . heparin  5,000 Units Subcutaneous Q8H  . insulin aspart  0-5 Units Subcutaneous QHS  . insulin aspart  0-9 Units Subcutaneous TID WC  . simvastatin  20 mg Oral q1800  . sodium chloride flush  3 mL Intravenous Q12H   Continuous Infusions: . lactated ringers Stopped (06/19/19 0748)     LOS: 2 days    Time spent: 25 minutes   Alma Friendly, MD [facility] [email or web address] 06/19/2019, 1:52 PM

## 2019-06-20 LAB — CBC WITH DIFFERENTIAL/PLATELET
Abs Immature Granulocytes: 0.11 10*3/uL — ABNORMAL HIGH (ref 0.00–0.07)
Basophils Absolute: 0.1 10*3/uL (ref 0.0–0.1)
Basophils Relative: 1 %
Eosinophils Absolute: 0.2 10*3/uL (ref 0.0–0.5)
Eosinophils Relative: 2 %
HCT: 39.7 % (ref 36.0–46.0)
Hemoglobin: 12.6 g/dL (ref 12.0–15.0)
Immature Granulocytes: 1 %
Lymphocytes Relative: 17 %
Lymphs Abs: 2 10*3/uL (ref 0.7–4.0)
MCH: 28.8 pg (ref 26.0–34.0)
MCHC: 31.7 g/dL (ref 30.0–36.0)
MCV: 90.6 fL (ref 80.0–100.0)
Monocytes Absolute: 1.1 10*3/uL — ABNORMAL HIGH (ref 0.1–1.0)
Monocytes Relative: 9 %
Neutro Abs: 8.4 10*3/uL — ABNORMAL HIGH (ref 1.7–7.7)
Neutrophils Relative %: 70 %
Platelets: 233 10*3/uL (ref 150–400)
RBC: 4.38 MIL/uL (ref 3.87–5.11)
RDW: 14.8 % (ref 11.5–15.5)
WBC: 11.8 10*3/uL — ABNORMAL HIGH (ref 4.0–10.5)
nRBC: 0 % (ref 0.0–0.2)

## 2019-06-20 LAB — BASIC METABOLIC PANEL
Anion gap: 13 (ref 5–15)
BUN: 51 mg/dL — ABNORMAL HIGH (ref 8–23)
CO2: 25 mmol/L (ref 22–32)
Calcium: 7 mg/dL — ABNORMAL LOW (ref 8.9–10.3)
Chloride: 102 mmol/L (ref 98–111)
Creatinine, Ser: 1.53 mg/dL — ABNORMAL HIGH (ref 0.44–1.00)
GFR calc Af Amer: 40 mL/min — ABNORMAL LOW (ref >60)
GFR calc non Af Amer: 34 mL/min — ABNORMAL LOW (ref >60)
Glucose, Bld: 187 mg/dL — ABNORMAL HIGH (ref 70–99)
Potassium: 3.3 mmol/L — ABNORMAL LOW (ref 3.5–5.1)
Sodium: 140 mmol/L (ref 135–145)

## 2019-06-20 LAB — GLUCOSE, CAPILLARY
Glucose-Capillary: 261 mg/dL — ABNORMAL HIGH (ref 70–99)
Glucose-Capillary: 170 mg/dL — ABNORMAL HIGH (ref 70–99)
Glucose-Capillary: 151 mg/dL — ABNORMAL HIGH (ref 70–99)
Glucose-Capillary: 159 mg/dL — ABNORMAL HIGH (ref 70–99)

## 2019-06-20 LAB — FOLATE RBC
Folate, Hemolysate: 491 ng/mL
Folate, RBC: 1279 ng/mL (ref >498)
Hematocrit: 38.4 % (ref 34.0–46.6)

## 2019-06-20 MED ORDER — LABETALOL HCL 100 MG PO TABS
50.0000 mg | ORAL_TABLET | Freq: Two times a day (BID) | ORAL | Status: DC
  Administered 2019-06-20 – 2019-06-22 (×5): 50 mg via ORAL
  Filled 2019-06-20 (×5): qty 1

## 2019-06-20 MED ORDER — POTASSIUM CHLORIDE CRYS ER 20 MEQ PO TBCR
40.0000 meq | EXTENDED_RELEASE_TABLET | Freq: Once | ORAL | Status: AC
  Administered 2019-06-20: 40 meq via ORAL
  Filled 2019-06-20: qty 2

## 2019-06-20 NOTE — Care Management Important Message (Signed)
Important Message  Patient Details IM Letter given to Dessa Phi RN to present to the Patient Name: Angela Hays MRN: [id number] Date of Birth: [birth year]   Medicare Important Message Given:  Yes     Kerin Salen 06/20/2019, 10:37 AM

## 2019-06-20 NOTE — Progress Notes (Signed)
PROGRESS NOTE    Angela Hays  ACZ:[id number] DOB: 22-Sep-1951 DOA: 06/16/2019 PCP: Maury Dus, MD     Brief Narrative:  Angela Hays is a 68 y.o. female with medical history significant for insulin-dependent diabetes, hypertension, and history of CVA, now presenting to the emergency department for evaluation of worsening lethargy, not eating, and hypotension.  Patient's family reports that the patient has been more lethargic over the past couple days, has not eaten anything in 2 days, but has continued to drink some fluids.  She has continued to take her medications as prescribed.  Patient has not complained of anything in particular per report of family.  At baseline, she is alert and talkative.  EMS was called out, patient was found to have systolic pressure in the 10X, and she was given 500 cc of IV fluids prior to arrival in the ED. Upon arrival to the ED, patient is found to be afebrile, saturating adequately on room air, and with blood pressure 90/47.  EKG features sinus rhythm with LVH and repolarization abnormality.  Chest x-ray is notable for hypoinflation without acute cardiopulmonary disease.  Noncontrast head CT reveals chronic infarctions but no acute intracranial abnormality.  Chemistry panel is concerning for potassium of 7.1, bicarbonate of 13, and creatinine 2.24 with unclear baseline but up from 1.50 a year ago.  CBC features a leukocytosis to 18,900 with a mild polycythemia.  BNP is slightly elevated 250 and troponin to 0.08.  Patient was treated with albuterol, IV calcium, insulin with dextrose, bicarbonate, and 1 L of normal saline in the ED.  Nephrology was consulted by the ED physician, suspects this is a prerenal injury and anticipates rapid improvement in the potassium, recommending bicarbonate infusion and medical admission.  New events last 24 hours / Subjective: Patient denied any new symptoms.  Still noted to be tachycardic  Assessment & Plan:   Principal Problem:   Hyperkalemia Active Problems:   DM (diabetes mellitus), type 2 with peripheral vascular complications (HCC)   Renal insufficiency   Hyponatremia   Metabolic acidosis   Chronic diastolic CHF (congestive heart failure) (HCC)   Hypotension due to hypovolemia   Elevated troponin   Candidal intertrigo   AKI (acute kidney injury) ([place])   Pressure injury of skin   Weakness   Palliative care by specialist   Goals of care, counseling/discussion   AKI on CKD stage III Improving -Presented with decreased oral intake, progressive lethargy -Baseline creatinine around 1.5 -Presented with Cr 2.24 --> 1.86 --> 1.37 with IVF -Likely prerenal azotemia in setting of anorexia, hypotension  SinusTachycardia EKG with sinus tachycardia, occasional PVCs D-dimer elevated at 0.71 Unable to do VQ scan due to inability of patient to lie still due to multiple contractures. Also unable to do CTA chest due to CKD Restart home labetalol at a lower dose  Hyperkalemia Resolved, now hypokalemic -Replace PRN  Hypotension; history of HTN  Improving -SBP was 80's with EMS, 90 on arrival to ED after 500 cc NS with EMS  -Likely secondary to hypovolemia and continued use of her multiple antihypertensives (norvasc, catapres, cardura, lasix, labetalol) -Hold antihypertensives, diuretic at this time -Restart lower dose of labetalol  Chronic diastolic CHF  -BNP is slightly elevated in ED, but patient is hypovolemic in setting of anorexia with continued diuretic use  -Continue to hold Lasix  Insulin-dependent DM; hypoglycemia  -Ha1c 10.7  -Sliding-scale insulin  Elevated troponin  -Demand ischemia   Acute metabolic encephalopathy  Improving -Apparently at baseline, alert  and conversant but often confuesd at baseline, now presenting with anorexia and lethargy   -Head CT is negative for acute findings    History of right-sided CVA  -No acute findings on CT head  -With chronic hemiparesis of the left  upper extremity and bilateral lower extremity contractures -Continue Zocor and Plavix    Intertrigo  -There is erythema about the diaper and intertriginous areas with maceration, erosions, and excoriations  -Topical clotrimazole    Left hip, posterior thigh wound, POA, not pressure injury related  -Appreciate Wound RN consult   Goals of care -Patient presents with anorexia, weakness, confusion, chronic contractures. She is apparently blind and is scheduled for cataract surgery as outpatient. Per son, he states that she is alert and conversational at baseline and paints a picture of a healthy individual. -Palliative consulted   DVT prophylaxis: Subq hep  Code Status: Full Family Communication: None Disposition Plan: Home once significant improvement with home health therapy   Consultants:   Palliative care  Procedures:   None   Antimicrobials:  Anti-infectives (From admission, onward)   None       Objective: Vitals:   06/19/19 2138 06/20/19 0538 06/20/19 0929 06/20/19 1348  BP: (!) 134/111 (!) 143/73 140/76 105/72  Pulse: (!) 129 (!) 114 (!) 105 (!) 105  Resp: 18 18  20   Temp: 98.7 F (37.1 C) 99.4 F (37.4 C)  98.8 F (37.1 C)  TempSrc: Oral Oral  Oral  SpO2: 98% 96%  97%  Weight:  59.6 kg    Height:  5\' 3"  (1.6 m)      Intake/Output Summary (Last 24 hours) at 06/20/2019 1505 Last data filed at 06/20/2019 1251 Gross per 24 hour  Intake 1240 ml  Output 1350 ml  Net -110 ml   Filed Weights   06/18/19 0747 06/20/19 0538  Weight: 71 kg 59.6 kg   Examination:  General: NAD   Cardiovascular: S1, S2 present  Respiratory: CTAB  Abdomen: Soft, nontender, nondistended, bowel sounds present  Musculoskeletal: No bilateral pedal edema noted, ++contractures  Skin: Posterior thigh wound  Psychiatry: Normal mood   Data Reviewed: I have personally reviewed following labs and imaging studies  CBC: Recent Labs  Lab 06/16/19 1458 06/17/19 0207 06/18/19  0222 06/19/19 0245 06/20/19 0536  WBC 18.9* 13.7* 11.8* 10.3 11.8*  NEUTROABS 16.9* 11.9*  --   --  8.4*  HGB 16.1* 14.5 14.6 13.3 12.6  HCT 49.4* 42.1 42.3 40.2  38.4 39.7  MCV 88.5 85.4 84.8 87.6 90.6  PLT 301 252 252 237 220   Basic Metabolic Panel: Recent Labs  Lab 06/16/19 2105 06/17/19 0207 06/17/19 0955 06/18/19 0222 06/19/19 0245 06/20/19 0536  NA 131* 130*  --  140 139 140  K 5.9* 5.5*  5.4* 4.3 3.3* 3.1* 3.3*  CL 103 99  --  101 101 102  CO2 16* 18*  --  26 23 25   GLUCOSE 205* 139*  --  95 152* 187*  BUN 136* 165*  --  101* 68* 51*  CREATININE 2.06* 1.86*  --  1.37* 1.29* 1.53*  CALCIUM 9.6 8.7*  --  8.0* 7.3* 7.0*   GFR: Estimated Creatinine Clearance: 28.3 mL/min (A) (by C-G formula based on SCr of 1.53 mg/dL (H)). Liver Function Tests: Recent Labs  Lab 06/16/19 1747  AST 22  ALT 17  ALKPHOS 92  BILITOT 0.7  PROT 6.5  ALBUMIN 3.3*   Recent Labs  Lab 06/16/19 1747  LIPASE 34  No results for input(s): AMMONIA in the last 168 hours. Coagulation Profile: No results for input(s): INR, PROTIME in the last 168 hours. Cardiac Enzymes: Recent Labs  Lab 06/16/19 1747 06/16/19 2115 06/16/19 2116 06/17/19 0207  CKTOTAL  --   --  172  --   TROPONINI 0.08* 0.08*  --  0.07*   BNP (last 3 results) No results for input(s): PROBNP in the last 8760 hours. HbA1C: No results for input(s): HGBA1C in the last 72 hours. CBG: Recent Labs  Lab 06/19/19 0938 06/19/19 1202 06/19/19 1614 06/19/19 2139 06/20/19 0810  GLUCAP 194* 180* 152* 164* 261*   Lipid Profile: No results for input(s): CHOL, HDL, LDLCALC, TRIG, CHOLHDL, LDLDIRECT in the last 72 hours. Thyroid Function Tests: No results for input(s): TSH, T4TOTAL, FREET4, T3FREE, THYROIDAB in the last 72 hours. Anemia Panel: No results for input(s): VITAMINB12, FOLATE, FERRITIN, TIBC, IRON, RETICCTPCT in the last 72 hours. Sepsis Labs: No results for input(s): PROCALCITON, LATICACIDVEN in the last  168 hours.  Recent Results (from the past 240 hour(s))  Novel Coronavirus,NAA,(SEND-OUT TO REF LAB - TAT 24-48 hrs); Hosp Order     Status: None   Collection Time: 06/16/19  3:28 PM   Specimen: Nasopharyngeal Swab; Respiratory  Result Value Ref Range Status   SARS-CoV-2, NAA NOT DETECTED NOT DETECTED Final    Comment: (NOTE) Testing was performed using the cobas(R) SARS-CoV-2 test. This test was developed and its performance characteristics determined by [employer]. This test has not been FDA cleared or approved. This test has been authorized by FDA under an Emergency Use Authorization (EUA). This test is only authorized for the duration of time the declaration that circumstances exist justifying the authorization of the emergency use of in vitro diagnostic tests for detection of SARS-CoV-2 virus and/or diagnosis of COVID-19 infection under section 564(b)(1) of the Act, 21 U.S.C. 696VEL-3(Y)(1), unless the authorization is terminated or revoked sooner. When diagnostic testing is negative, the possibility of a false negative result should be considered in the context of a patient's recent exposures and the presence of clinical signs and symptoms consistent with COVID-19. An individual without symptoms of COVID-19 and who is not shedding SARS-CoV-2 virus would expect to have  a negative (not detected) result in this assay. Performed At: [facility] [place], [place] [id number] Rush Farmer MD NI:[id number]    [place]  Final    Comment: Performed at [facility] [address] [place], [place] [address]  Urine culture     Status: Abnormal   Collection Time: 06/17/19 12:47 AM   Specimen: Urine, Random  Result Value Ref Range Status   Specimen Description   Final    URINE, RANDOM Performed at [facility] [address] [place], [place] [address]    Special Requests   Final     NONE Performed at [facility], [place] [address], [place], [place] [address]    Culture MULTIPLE SPECIES PRESENT, SUGGEST RECOLLECTION (A)  Final   Report Status 06/18/2019 FINAL  Final  MRSA PCR Screening     Status: None   Collection Time: 06/17/19  2:11 AM   Specimen: Nasal Mucosa; Nasopharyngeal  Result Value Ref Range Status   MRSA by PCR NEGATIVE NEGATIVE Final    Comment:        The GeneXpert MRSA Assay (FDA approved for NASAL specimens only), is one component of a comprehensive MRSA colonization surveillance program. It is not intended  to diagnose MRSA infection nor to guide or monitor treatment for MRSA infections. Performed at [facility], [place] [address] [place], [place] [address]        Radiology Studies: No results found.    Scheduled Meds: . Chlorhexidine Gluconate Cloth  6 each Topical Daily  . clopidogrel  75 mg Oral Daily  . clotrimazole   Topical BID  . heparin  5,000 Units Subcutaneous Q8H  . insulin aspart  0-5 Units Subcutaneous QHS  . insulin aspart  0-9 Units Subcutaneous TID WC  . labetalol  50 mg Oral BID  . simvastatin  20 mg Oral q1800  . sodium chloride flush  3 mL Intravenous Q12H   Continuous Infusions:    LOS: 3 days    Time spent: 25 minutes   Alma Friendly, MD [facility] [email or web address] 06/20/2019, 3:05 PM

## 2019-06-20 NOTE — Progress Notes (Signed)
Daily Progress Note   Patient Name: Angela Hays       Date: 06/20/2019 DOB: Dec 26, 1951  Age: 68 y.o. MRN#: [id number] Attending Physician: Alma Friendly, MD Primary Care Physician: Maury Dus, MD Admit Date: 06/16/2019  Reason for Consultation/Follow-up: Establishing goals of care  Subjective:  patient is awake, resting in bed. She denies any complaints, she is going to be discharged home. She states her preference is for going home with son, states that he takes good care of her.    Length of Stay: 3  Current Medications: Scheduled Meds:  . Chlorhexidine Gluconate Cloth  6 each Topical Daily  . clopidogrel  75 mg Oral Daily  . clotrimazole   Topical BID  . heparin  5,000 Units Subcutaneous Q8H  . insulin aspart  0-5 Units Subcutaneous QHS  . insulin aspart  0-9 Units Subcutaneous TID WC  . labetalol  50 mg Oral BID  . potassium chloride  40 mEq Oral Once  . simvastatin  20 mg Oral q1800  . sodium chloride flush  3 mL Intravenous Q12H    Continuous Infusions:   PRN Meds: acetaminophen **OR** acetaminophen, HYDROcodone-acetaminophen, lip balm, ondansetron **OR** ondansetron (ZOFRAN) IV  Physical Exam         Appears frail Has contractures Work of breathing doesn't appear labored Patient able to speak in full sentences   No edema Non focal Abdomen not distended  Vital Signs: BP 140/76   Pulse (!) 105   Temp 99.4 F (37.4 C) (Oral)   Resp 18   Ht 5\' 3"  (1.6 m)   Wt 59.6 kg   SpO2 96%   BMI 23.28 kg/m  SpO2: SpO2: 96 % O2 Device: O2 Device: Room Air O2 Flow Rate:    Intake/output summary:   Intake/Output Summary (Last 24 hours) at 06/20/2019 1041 Last data filed at 06/20/2019 0848 Gross per 24 hour  Intake 1560.35 ml  Output 1050 ml  Net 510.35  ml   LBM: Last BM Date: 06/20/19 Baseline Weight: Weight: (bed would not weigh ) Most recent weight: Weight: 59.6 kg       Palliative Assessment/Data:    Flowsheet Rows     Most Recent Value  Intake Tab  Referral Department  Hospitalist  Unit at Time of Referral  Intermediate Care Unit  Palliative  Care Primary Diagnosis  Nephrology  Palliative Care Type  New Palliative care  Reason for referral  Clarify Goals of Care  Date first seen by Palliative Care  06/18/19  Clinical Assessment  Palliative Performance Scale Score  40%  Pain Max last 24 hours  5  Pain Min Last 24 hours  4  Dyspnea Max Last 24 Hours  4  Dyspnea Min Last 24 hours  3  Psychosocial & Spiritual Assessment  Palliative Care Outcomes      Patient Active Problem List   Diagnosis Date Noted  . Weakness   . Palliative care by specialist   . Goals of care, counseling/discussion   . Pressure injury of skin 06/17/2019  . Hyperkalemia 06/16/2019  . Renal insufficiency 06/16/2019  . Hyponatremia 06/16/2019  . Metabolic acidosis 12/28/7251  . Chronic diastolic CHF (congestive heart failure) ([place]) 06/16/2019  . Hypotension due to hypovolemia 06/16/2019  . Elevated troponin 06/16/2019  . Candidal intertrigo 06/16/2019  . AKI (acute kidney injury) ([place])   . ARF (acute renal failure) ([place]) 05/11/2016  . Leg ulcer ([place]) 05/11/2016  . Cellulitis and abscess of leg 05/06/2016  . Sepsis ([place]) 01/05/2016  . Pressure ulcer 01/05/2016  . Leg swelling 01/05/2016  . DM (diabetes mellitus), type 2 with peripheral vascular complications ([place]) 66/44/0347  . Hypertensive urgency 01/05/2016    Palliative Care Assessment & Plan   Patient Profile:    Assessment: AKI with III CKD Hyperkalemia Hypotension Weakness Contractures frailty  history of stroke Functional decline at home.   Recommendations/Plan:   as per initial goals of care discussions, both patient and son expressed preference for home with home  health/home based PT on discharge. Appreciate care manager input   regarding appropriate disposition.  Continue current mode of care, encourage PO and OOB as much as possible.   No additional PMT specific recommendations at this time.   Home with home health discharge today, recommend out patient palliative care and follow up with PCP post discharge.    Code Status:    Code Status Orders  (From admission, onward)         Start     Ordered   06/17/19 0047  Full code  Continuous     06/17/19 0047        Code Status History    Date Active Date Inactive Code Status Order ID Comments User Context   05/06/2016 2136 05/15/2016 1738 Full Code [id number]  Marshell Garfinkel, MD ED   01/05/2016 1823 01/07/2016 1514 Full Code [id number]  Theodis Blaze, MD Inpatient   Advance Care Planning Activity       Prognosis:   Unable to determine  Discharge Planning:  Home with Home Health  with palliative.   Care plan was discussed with patient.   Thank you for allowing the Palliative Medicine Team to assist in the care of this patient.   Time In: 9 Time Out: 9.25 Total Time 25 Prolonged Time Billed  no       Greater than 50%  of this time was spent counseling and coordinating care related to the above assessment and plan.  Loistine Chance, MD [phone] Please contact Palliative Medicine Team phone at [phone] for questions and concerns.

## 2019-06-21 ENCOUNTER — Inpatient Hospital Stay (HOSPITAL_COMMUNITY): Payer: Medicare Other

## 2019-06-21 LAB — BASIC METABOLIC PANEL
Anion gap: 8 (ref 5–15)
BUN: 48 mg/dL — ABNORMAL HIGH (ref 8–23)
CO2: 32 mmol/L (ref 22–32)
Calcium: 7.1 mg/dL — ABNORMAL LOW (ref 8.9–10.3)
Chloride: 102 mmol/L (ref 98–111)
Creatinine, Ser: 1.61 mg/dL — ABNORMAL HIGH (ref 0.44–1.00)
GFR calc Af Amer: 37 mL/min — ABNORMAL LOW (ref >60)
GFR calc non Af Amer: 32 mL/min — ABNORMAL LOW (ref >60)
Glucose, Bld: 197 mg/dL — ABNORMAL HIGH (ref 70–99)
Potassium: 3.2 mmol/L — ABNORMAL LOW (ref 3.5–5.1)
Sodium: 142 mmol/L (ref 135–145)

## 2019-06-21 LAB — GLUCOSE, CAPILLARY
Glucose-Capillary: 170 mg/dL — ABNORMAL HIGH (ref 70–99)
Glucose-Capillary: 184 mg/dL — ABNORMAL HIGH (ref 70–99)
Glucose-Capillary: 149 mg/dL — ABNORMAL HIGH (ref 70–99)
Glucose-Capillary: 145 mg/dL — ABNORMAL HIGH (ref 70–99)

## 2019-06-21 MED ORDER — POTASSIUM CHLORIDE CRYS ER 20 MEQ PO TBCR
40.0000 meq | EXTENDED_RELEASE_TABLET | Freq: Once | ORAL | Status: AC
  Administered 2019-06-21: 40 meq via ORAL
  Filled 2019-06-21: qty 2

## 2019-06-21 MED ORDER — SODIUM CHLORIDE 0.9 % IV SOLN: INTRAVENOUS | Status: DC

## 2019-06-21 MED ORDER — CALCIUM GLUCONATE-NACL 1-0.675 GM/50ML-% IV SOLN
1.0000 g | Freq: Once | INTRAVENOUS | Status: AC
  Administered 2019-06-21: 1000 mg via INTRAVENOUS
  Filled 2019-06-21: qty 50

## 2019-06-21 MED ORDER — SODIUM CHLORIDE 0.9 % IV SOLN
INTRAVENOUS | Status: DC
  Administered 2019-06-21: 12:00:00 via INTRAVENOUS

## 2019-06-21 NOTE — Progress Notes (Signed)
PROGRESS NOTE    Angela Hays  DQQ:[id number] DOB: 04/18/1951 DOA: 06/16/2019 PCP: Maury Dus, MD     Brief Narrative:  Angela Hays is a 68 y.o. female with medical history significant for insulin-dependent diabetes, hypertension, and history of CVA, now presenting to the emergency department for evaluation of worsening lethargy, not eating, and hypotension.  Patient's family reports that the patient has been more lethargic over the past couple days, has not eaten anything in 2 days, but has continued to drink some fluids.  She has continued to take her medications as prescribed.  Patient has not complained of anything in particular per report of family.  At baseline, she is alert and talkative.  EMS was called out, patient was found to have systolic pressure in the 19E, and she was given 500 cc of IV fluids prior to arrival in the ED. Upon arrival to the ED, patient is found to be afebrile, saturating adequately on room air, and with blood pressure 90/47.  EKG features sinus rhythm with LVH and repolarization abnormality.  Chest x-ray is notable for hypoinflation without acute cardiopulmonary disease.  Noncontrast head CT reveals chronic infarctions but no acute intracranial abnormality.  Chemistry panel is concerning for potassium of 7.1, bicarbonate of 13, and creatinine 2.24 with unclear baseline but up from 1.50 a year ago.  CBC features a leukocytosis to 18,900 with a mild polycythemia.  BNP is slightly elevated 250 and troponin to 0.08.  Patient was treated with albuterol, IV calcium, insulin with dextrose, bicarbonate, and 1 L of normal saline in the ED.  Nephrology was consulted by the ED physician, suspects this is a prerenal injury and anticipates rapid improvement in the potassium, recommending bicarbonate infusion and medical admission.  New events last 24 hours / Subjective: Patient denies any new complaints.  Assessment & Plan:   Principal Problem:   Hyperkalemia Active  Problems:   DM (diabetes mellitus), type 2 with peripheral vascular complications ([facility])   Renal insufficiency   Hyponatremia   Metabolic acidosis   Chronic diastolic CHF (congestive heart failure) ([facility])   Hypotension due to hypovolemia   Elevated troponin   Candidal intertrigo   AKI (acute kidney injury) ([place])   Pressure injury of skin   Weakness   Palliative care by specialist   Goals of care, counseling/discussion   AKI on CKD stage III Cr slowly trending up -Presented with decreased oral intake, progressive lethargy -Baseline creatinine around 1.5 -Presented with Cr 2.24 --> 1.86 --> 1.37, now 1.61 (pt making urine) -Likely prerenal azotemia in setting of anorexia, hypotension -Re-start gentle hydration for 1L -Daily BMP  SinusTachycardia EKG with sinus tachycardia, occasional PVCs D-dimer elevated at 0.71 Unable to do VQ scan due to inability of patient to lie still due to multiple contractures. Also unable to do CTA chest due to CKD Restart home labetalol at a lower dose  Hyperkalemia Resolved, now hypokalemic -Replace PRN  Hypotension; history of HTN  Improving -SBP was 80's with EMS, 90 on arrival to ED after 500 cc NS with EMS  -Likely secondary to hypovolemia and continued use of her multiple antihypertensives (norvasc, catapres, cardura, lasix, labetalol) -Hold antihypertensives, diuretic at this time -Restart lower dose of labetalol  Chronic diastolic CHF  -BNP is slightly elevated in ED, but patient is hypovolemic in setting of anorexia with continued diuretic use  -Continue to hold Lasix  Insulin-dependent DM; hypoglycemia  -Ha1c 10.7  -Sliding-scale insulin  Elevated troponin  -Demand ischemia   Acute  metabolic encephalopathy  Resolved -Apparently at baseline, alert and conversant but often confuesd at baseline, now presenting with anorexia and lethargy   -Head CT is negative for acute findings    History of right-sided CVA  -No acute  findings on CT head  -With chronic hemiparesis of the left upper extremity and bilateral lower extremity contractures -Continue Zocor and Plavix    Intertrigo  -There is erythema about the diaper and intertriginous areas with maceration, erosions, and excoriations  -Topical clotrimazole    Left hip, posterior thigh wound, POA, not pressure injury related  -Appreciate Wound RN consult   Goals of care -Patient presents with anorexia, weakness, confusion, chronic contractures. She is apparently blind and is scheduled for cataract surgery as outpatient. Per son, he states that she is alert and conversational at baseline and paints a picture of a healthy individual. -Palliative consulted   DVT prophylaxis: Subq hep  Code Status: Full Family Communication: None Disposition Plan: Home once significant improvement with home health therapy   Consultants:   Palliative care  Procedures:   None   Antimicrobials:  Anti-infectives (From admission, onward)   None       Objective: Vitals:   06/20/19 2137 06/21/19 0500 06/21/19 0532 06/21/19 1053  BP: 131/73  101/67 118/76  Pulse: (!) 107  (!) 110 100  Resp:   (!) 24   Temp: 99.3 F (37.4 C)  99.2 F (37.3 C)   TempSrc: Oral  Oral   SpO2: 98%  95%   Weight:  59.1 kg    Height:        Intake/Output Summary (Last 24 hours) at 06/21/2019 1526 Last data filed at 06/21/2019 1251 Gross per 24 hour  Intake 713 ml  Output 1050 ml  Net -337 ml   Filed Weights   06/18/19 0747 06/20/19 0538 06/21/19 0500  Weight: 71 kg 59.6 kg 59.1 kg   Examination:  General: NAD   Cardiovascular: S1, S2 present  Respiratory: CTAB  Abdomen: Soft, nontender, nondistended, bowel sounds present  Musculoskeletal: No bilateral pedal edema noted, ++ contractures  Skin:  Posterior thigh wound  Psychiatry: Normal mood   Data Reviewed: I have personally reviewed following labs and imaging studies  CBC: Recent Labs  Lab 06/16/19 1458  06/17/19 0207 06/18/19 0222 06/19/19 0245 06/20/19 0536  WBC 18.9* 13.7* 11.8* 10.3 11.8*  NEUTROABS 16.9* 11.9*  --   --  8.4*  HGB 16.1* 14.5 14.6 13.3 12.6  HCT 49.4* 42.1 42.3 40.2   38.4 39.7  MCV 88.5 85.4 84.8 87.6 90.6  PLT 301 252 252 237 542   Basic Metabolic Panel: Recent Labs  Lab 06/17/19 0207 06/17/19 0955 06/18/19 0222 06/19/19 0245 06/20/19 0536 06/21/19 0442  NA 130*  --  140 139 140 142  K 5.5*   5.4* 4.3 3.3* 3.1* 3.3* 3.2*  CL 99  --  101 101 102 102  CO2 18*  --  26 23 25  32  GLUCOSE 139*  --  95 152* 187* 197*  BUN 165*  --  101* 68* 51* 48*  CREATININE 1.86*  --  1.37* 1.29* 1.53* 1.61*  CALCIUM 8.7*  --  8.0* 7.3* 7.0* 7.1*   GFR: Estimated Creatinine Clearance: 26.9 mL/min (A) (by C-G formula based on SCr of 1.61 mg/dL (H)). Liver Function Tests: Recent Labs  Lab 06/16/19 1747  AST 22  ALT 17  ALKPHOS 92  BILITOT 0.7  PROT 6.5  ALBUMIN 3.3*   Recent Labs  Lab  06/16/19 1747  LIPASE 34   No results for input(s): AMMONIA in the last 168 hours. Coagulation Profile: No results for input(s): INR, PROTIME in the last 168 hours. Cardiac Enzymes: Recent Labs  Lab 06/16/19 1747 06/16/19 2115 06/16/19 2116 06/17/19 0207  CKTOTAL  --   --  172  --   TROPONINI 0.08* 0.08*  --  0.07*   BNP (last 3 results) No results for input(s): PROBNP in the last 8760 hours. HbA1C: No results for input(s): HGBA1C in the last 72 hours. CBG: Recent Labs  Lab 06/20/19 1121 06/20/19 1631 06/20/19 2148 06/21/19 0730 06/21/19 1118  GLUCAP 170* 151* 159* 170* 184*   Lipid Profile: No results for input(s): CHOL, HDL, LDLCALC, TRIG, CHOLHDL, LDLDIRECT in the last 72 hours. Thyroid Function Tests: No results for input(s): TSH, T4TOTAL, FREET4, T3FREE, THYROIDAB in the last 72 hours. Anemia Panel: No results for input(s): VITAMINB12, FOLATE, FERRITIN, TIBC, IRON, RETICCTPCT in the last 72 hours. Sepsis Labs: No results for input(s): PROCALCITON,  LATICACIDVEN in the last 168 hours.  Recent Results (from the past 240 hour(s))  Novel Coronavirus,NAA,(SEND-OUT TO REF LAB - TAT 24-48 hrs); Hosp Order     Status: None   Collection Time: 06/16/19  3:28 PM   Specimen: Nasopharyngeal Swab; Respiratory  Result Value Ref Range Status   SARS-CoV-2, NAA NOT DETECTED NOT DETECTED Final    Comment: (NOTE) Testing was performed using the cobas(R) SARS-CoV-2 test. This test was developed and its performance characteristics determined by [employer]. This test has not been FDA cleared or approved. This test has been authorized by FDA under an Emergency Use Authorization (EUA). This test is only authorized for the duration of time the declaration that circumstances exist justifying the authorization of the emergency use of in vitro diagnostic tests for detection of SARS-CoV-2 virus and/or diagnosis of COVID-19 infection under section 564(b)(1) of the Act, 21 U.S.C. 761PJK-9(T)(2), unless the authorization is terminated or revoked sooner. When diagnostic testing is negative, the possibility of a false negative result should be considered in the context of a patient's recent exposures and the presence of clinical signs and symptoms consistent with COVID-19. An individual without symptoms of COVID-19 and who is not shedding SARS-CoV-2 virus would expect to have  a negative (not detected) result in this assay. Performed At: [facility] [place], [place] [id number] Rush Farmer MD XI:[id number]    [place]  Final    Comment: Performed at [facility] [address] [place], [place] [address]  Urine culture     Status: Abnormal   Collection Time: 06/17/19 12:47 AM   Specimen: Urine, Random  Result Value Ref Range Status   Specimen Description   Final    URINE, RANDOM Performed at [facility] [address] [place], [place] [address]     Special Requests   Final    NONE Performed at [facility], [place] [address], [place], [place] [address]    Culture MULTIPLE SPECIES PRESENT, SUGGEST RECOLLECTION (A)  Final   Report Status 06/18/2019 FINAL  Final  MRSA PCR Screening     Status: None   Collection Time: 06/17/19  2:11 AM   Specimen: Nasal Mucosa; Nasopharyngeal  Result Value Ref Range Status   MRSA by PCR NEGATIVE NEGATIVE Final    Comment:        The GeneXpert MRSA Assay (FDA approved for NASAL specimens only), is one component of a comprehensive MRSA  colonization surveillance program. It is not intended to diagnose MRSA infection nor to guide or monitor treatment for MRSA infections. Performed at [facility], [place] [address] [place], [place] [address]        Radiology Studies: Dg Chest Port 1 View  Result Date: 06/21/2019 CLINICAL DATA:  Fluid overload EXAM: PORTABLE CHEST 1 VIEW COMPARISON:  June 16, 2019 FINDINGS: No edema or consolidation. Heart size and pulmonary vascularity are normal. No adenopathy. There is aortic atherosclerosis. No bone lesions. IMPRESSION: No edema or consolidation. Cardiac silhouette within normal limits. Aortic Atherosclerosis (ICD10-I70.0). Electronically Signed   By: Lowella Grip III M.D.   On: 06/21/2019 08:39      Scheduled Meds:  Chlorhexidine Gluconate Cloth  6 each Topical Daily   clopidogrel  75 mg Oral Daily   clotrimazole   Topical BID   heparin  5,000 Units Subcutaneous Q8H   insulin aspart  0-5 Units Subcutaneous QHS   insulin aspart  0-9 Units Subcutaneous TID WC   labetalol  50 mg Oral BID   simvastatin  20 mg Oral q1800   sodium chloride flush  3 mL Intravenous Q12H   Continuous Infusions:  sodium chloride 100 mL/hr at 06/21/19 1207     LOS: 4 days    Time spent: 25 minutes   Alma Friendly, MD [facility] [email or web address] 06/21/2019, 3:26 PM

## 2019-06-21 NOTE — Progress Notes (Signed)
This RN noticed a notable size mass on patients right upper chest. MD made aware and came to bedside. MD informed RN that mass was previously documented on and was of no concern at this time. Will continue to monitor.

## 2019-06-21 NOTE — Progress Notes (Signed)
PT Cancellation Note  Patient Details Name: Angela Hays MRN: [id number] DOB: 1951/01/31   Cancelled Treatment:     unable to arouse enough to participate. Pt is total Care and was home with son.  Pt has been evaluated with rec for SNF.      Rica Koyanagi  PTA Acute  Rehabilitation Services Pager      [phone] Office      [phone]

## 2019-06-22 LAB — GLUCOSE, CAPILLARY
Glucose-Capillary: 193 mg/dL — ABNORMAL HIGH (ref 70–99)
Glucose-Capillary: 265 mg/dL — ABNORMAL HIGH (ref 70–99)

## 2019-06-22 LAB — BASIC METABOLIC PANEL
Anion gap: 11 (ref 5–15)
BUN: 38 mg/dL — ABNORMAL HIGH (ref 8–23)
CO2: 25 mmol/L (ref 22–32)
Calcium: 7.3 mg/dL — ABNORMAL LOW (ref 8.9–10.3)
Chloride: 106 mmol/L (ref 98–111)
Creatinine, Ser: 1.36 mg/dL — ABNORMAL HIGH (ref 0.44–1.00)
GFR calc Af Amer: 46 mL/min — ABNORMAL LOW (ref >60)
GFR calc non Af Amer: 39 mL/min — ABNORMAL LOW (ref >60)
Glucose, Bld: 201 mg/dL — ABNORMAL HIGH (ref 70–99)
Potassium: 3.5 mmol/L (ref 3.5–5.1)
Sodium: 142 mmol/L (ref 135–145)

## 2019-06-22 MED ORDER — CLOTRIMAZOLE 1 % EX CREA: TOPICAL_CREAM | Freq: Two times a day (BID) | CUTANEOUS | 0 refills | Status: DC

## 2019-06-22 NOTE — Progress Notes (Addendum)
Patient son [name] called me and updated. Informed him that patient is cleared for DC.

## 2019-06-22 NOTE — Progress Notes (Signed)
Nsg Discharge Note  Admit Date:  06/16/2019 Discharge date: 06/22/2019   Angela Hays to be discharged home per MD order.  AVS completed.  Patient/caregiver able to verbalize understanding.  Discharge Medication: Allergies as of 06/22/2019   No Known Allergies     Medication List    STOP taking these medications   amLODipine 5 MG tablet Commonly known as: NORVASC   cloNIDine 0.2 MG tablet Commonly known as: CATAPRES     TAKE these medications   Basaglar KwikPen 100 UNIT/ML Sopn Inject 15 Units into the skin 2 (two) times daily. Notes to patient: Nest dose 6/27   clopidogrel 75 MG tablet Commonly known as: PLAVIX Take 75 mg by mouth daily. Notes to patient: Next Dose 6/28   clotrimazole 1 % cream Commonly known as: LOTRIMIN Apply topically 2 (two) times daily. Notes to patient: Nest Dose 6/27   doxazosin 4 MG tablet Commonly known as: CARDURA Take 4 mg by mouth at bedtime. Notes to patient: Nest Dose 6/27   Durezol 0.05 % Emul Generic drug: Difluprednate Place 1 drop into the right eye as directed. Instill 1 drop BID IN OPERATIVE EYE Starting 2 Days Prior to Surgery and After Surgery for 3 Weeks Notes to patient: See instructions   furosemide 40 MG tablet Commonly known as: LASIX Take 0.5 tablets (20 mg total) by mouth daily as needed for fluid or edema (for weight gain of more then 2 Lbs in 1 day). What changed:   how much to take  when to take this   Klor-Con M20 20 MEQ tablet Generic drug: potassium chloride SA Take 20 mEq by mouth daily. Notes to patient: Next Dose 6/28   labetalol 100 MG tablet Commonly known as: NORMODYNE Take 100 mg by mouth 2 (two) times daily. Notes to patient: Nest Dose due 6/27   ofloxacin 0.3 % ophthalmic solution Commonly known as: OCUFLOX Place 1 drop into the right eye as directed. Instill 1 drop BID IN OPERATIVE EYE Starting 2 Days Prior to Surgery and After Surgery for 3 Weeks Notes to patient: See instructions    simvastatin 20 MG tablet Commonly known as: ZOCOR Take 20 mg by mouth daily with breakfast. Notes to patient: Nest Dose 6/28       Discharge Assessment: Vitals:   06/22/19 0758 06/22/19 1241  BP: (!) 132/99 107/61  Pulse: 86 99  Resp: 20 15  Temp: 99.2 F (37.3 C) 98.9 F (37.2 C)  SpO2: 100% 97%   Skin clean, dry and intact without evidence of skin break down, no evidence of skin tears noted. IV catheter discontinued intact. Site without signs and symptoms of complications - no redness or edema noted at insertion site, patient denies c/o pain - only slight tenderness at site.  Dressing with slight pressure applied.  D/c Instructions-Education: Discharge instructions given to patient/family with verbalized understanding. D/c education completed with patient/family including follow up instructions, medication list, d/c activities limitations if indicated, with other d/c instructions as indicated by MD - patient able to verbalize understanding, all questions fully answered. Patient instructed to return to ED, call 911, or call MD for any changes in condition.  Patient escorted via [place], and D/C home via private auto.  Angela Hays, Angela Schimke, RN 06/22/2019 3:55 PM

## 2019-06-22 NOTE — Progress Notes (Signed)
Home Health needs already taken care off per case manager

## 2019-06-22 NOTE — Discharge Summary (Signed)
Discharge Summary  Angela Hays:[id number] DOB: 1951/12/19  PCP: Maury Dus, MD  Admit date: 06/16/2019 Discharge date: 06/22/2019  Time spent: 35 mins  Recommendations for Outpatient Follow-up:  1. Follow-up with PCP in 1 week for blood pressure checks and repeat labs  Discharge Diagnoses:  Active Hospital Problems   Diagnosis Date Noted   Hyperkalemia 06/16/2019   Weakness    Palliative care by specialist    Goals of care, counseling/discussion    Pressure injury of skin 06/17/2019   Renal insufficiency 06/16/2019   Hyponatremia 67/20/9470   Metabolic acidosis 96/28/3662   Chronic diastolic CHF (congestive heart failure) ([place]) 06/16/2019   Hypotension due to hypovolemia 06/16/2019   Elevated troponin 06/16/2019   Candidal intertrigo 06/16/2019   AKI (acute kidney injury) ([place])    DM (diabetes mellitus), type 2 with peripheral vascular complications ([place]) 94/76/5465    Resolved Hospital Problems  No resolved problems to display.    Discharge Condition: Stable  Diet recommendation: Heart healthy  Vitals:   06/21/19 2043 06/22/19 0758  BP: (!) 122/58 (!) 132/99  Pulse: 97 86  Resp: (!) 24 20  Temp: 98.8 F (37.1 C) 99.2 F (37.3 C)  SpO2: 100% 100%    History of present illness:  Angela Hays a 68 y.o.femalewith medical history significant forinsulin-dependent diabetes, hypertension, and history of CVA, now presenting to the emergency department for evaluation of worsening lethargy, not eating, and hypotension. Patient's family reports that the patient has been more lethargic over the past couple days, has not eaten anything in 2 days, but has continued to drink some fluids. She has continued to take her medications as prescribed. Patient has not complained of anything in particular per report of family. At baseline, she is alert and talkative. EMS was called out, patient was found to have systolic pressure in the 03T, and she was  given 500 cc of IV fluids prior to arrival in the ED. Upon arrival to the ED, patient is found to be afebrile, saturating adequately on room air, and with blood pressure 90/47. EKG features sinus rhythm with LVH and repolarization abnormality. Chest x-ray is notable for hypoinflation without acute cardiopulmonary disease. Noncontrast head CT reveals chronic infarctions but no acute intracranial abnormality. Chemistry panel is concerning for potassium of 7.1, bicarbonate of 13, and creatinine 2.24 with unclear baseline but up from 1.50 a year ago. CBC features a leukocytosis to 18,900 with a mild polycythemia. BNP is slightly elevated 250 and troponin to 0.08. Patient was treated with albuterol, IV calcium, insulin with dextrose, bicarbonate, and 1 L of normal saline in the ED. Nephrology was consulted by the ED physician, suspects this is a prerenal injury and anticipates rapid improvement in the potassium, recommending bicarbonate infusion and medical admission.    Today, patient denies any new complaints.  Patient eager to be discharged home to her son Angela Hays.  Hospital Course:  Principal Problem:   Hyperkalemia Active Problems:   DM (diabetes mellitus), type 2 with peripheral vascular complications (H[facility])   Renal insufficiency   Hyponatremia   Metabolic acidosis   Chronic diastolic CHF (congestive heart failure) ([facility])   Hypotension due to hypovolemia   Elevated troponin   Candidal intertrigo   AKI (acute kidney injury) ([place])   Pressure injury of skin   Weakness   Palliative care by specialist   Goals of care, counseling/discussion  AKI on CKD stage III Cr improved -Presented with decreased oral intake, progressive lethargy -Baseline creatinine around 1.5 -Presented  with Cr 2.24 --> 1.86 --> 1.37, now 1.36 (pt making urine) s/p gentle hydration -Likely prerenal azotemia in setting of anorexia, hypotension -Follow-up with PCP with repeat labs in 1  week  SinusTachycardia Improved EKG with sinus tachycardia, occasional PVCs D-dimer elevated at 0.71 Unable to do VQ scan due to inability of patient to lie still due to multiple contractures. Also unable to do CTA chest due to CKD Restart home labetalol Follow-up with PCP  Hyperkalemia Resolved -Replace PRN  Hypotension; history of HTN Improved -SBP was 80's with EMS, 90 on arrival to ED after 500 cc NS with EMS -Likely secondary to hypovolemia and continued use of her multiple antihypertensives(norvasc, catapres, cardura, lasix, labetalol) -Hold antihypertensives, diuretic, except labetalol at this time until patient sees her primary care, who then reevaluate her blood pressure medications -Continue labetalol only -PCP to reevaluate BP and adjust medications accordingly  Chronic diastolic CHF -BNP is slightly elevated in ED, but patient is hypovolemic in setting of anorexia with continued diuretic use -Continue to hold Lasix  Insulin-dependent DM; hypoglycemia -Ha1c 10.7  -Continue home regimen  Elevated troponin -Demand ischemia, chest pain-free  Acute metabolic encephalopathy  Resolved -Apparently at baseline, alert and conversant but often confuesd at baseline, now presenting with anorexia and lethargy -Head CT is negative for acute findings  History of right-sided CVA  -No acute findings on CT head  -With 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 She is apparently blind and is scheduled for cataract surgery as outpatient. Per son, he  states that she is alert and conversational at baseline and paints a picture of a healthy individual. -Palliative consulted, outpatient palliative recommended         Malnutrition Type:      Malnutrition Characteristics:      Nutrition Interventions:      Estimated body mass index is 23.43 kg/m as calculated from the following:   Height as of this encounter: 5\' 3"  (1.6 m).   Weight as of this encounter: 60 kg.    Procedures:  None  Consultations:  Palliative care  Discharge Exam: BP (!) 132/99 (BP Location: Left Arm)    Pulse 86    Temp 99.2 F (37.3 C) (Oral)    Resp 20    Ht 5\' 3"  (1.6 m)    Wt 60 kg    SpO2 100%    BMI 23.43 kg/m   General: NAD Cardiovascular: S1, S2 present Respiratory: CTA B  Discharge Instructions You were cared for by a hospitalist during your hospital stay. If you have any questions about your discharge medications or the care you received while you were in the hospital after you are discharged, you can call the unit and asked to speak with the hospitalist on call if the hospitalist that took care of you is not available. Once you are discharged, your primary care physician will handle any further medical issues. Please note that NO REFILLS for any discharge medications will be authorized once you are discharged, as it is imperative that you return to your primary care physician (or establish a relationship with a primary care physician if you do not have one)  for your aftercare needs so that they can reassess your need for medications and monitor your lab values.   Allergies as of 06/22/2019   No Known Allergies     Medication List    STOP taking these medications   amLODipine 5 MG tablet Commonly known as: NORVASC   cloNIDine 0.2 MG tablet Commonly known as: CATAPRES     TAKE these medications   Basaglar KwikPen 100 UNIT/ML Sopn Inject 15 Units into the skin 2 (two) times daily.   clopidogrel 75 MG tablet Commonly known as:  PLAVIX Take 75 mg by mouth daily.   clotrimazole 1 % cream Commonly known as: LOTRIMIN Apply topically 2 (two) times daily.   doxazosin 4 MG tablet Commonly known as: CARDURA Take 4 mg by mouth at bedtime.   Durezol 0.05 % Emul Generic drug: Difluprednate Place 1 drop into the right eye as directed. Instill 1 drop BID IN OPERATIVE EYE Starting 2 Days Prior to Surgery and After Surgery for 3 Weeks   furosemide 40 MG tablet Commonly known as: LASIX Take 0.5 tablets (20 mg total) by mouth daily as needed for fluid or edema (for weight gain of more then 2 Lbs in 1 day). What changed:   how much to take  when to take this   Klor-Con M20 20 MEQ tablet Generic drug: potassium chloride SA Take 20 mEq by mouth daily.   labetalol 100 MG tablet Commonly known as: NORMODYNE Take 100 mg by mouth 2 (two) times daily.   ofloxacin 0.3 % ophthalmic solution Commonly known as: OCUFLOX Place 1 drop into the right eye as directed. Instill 1 drop BID IN OPERATIVE EYE Starting 2 Days Prior to Surgery and After Surgery for 3 Weeks   simvastatin 20 MG tablet Commonly known as: ZOCOR Take 20 mg by mouth daily with breakfast.      No Known Allergies Follow-up [facility] Follow up.   Why: For home health PT/RN/Aide Contact information: [place] [address] [phone]        Maury Dus, MD. Schedule an appointment as soon as possible for a visit in 1 week(s).   Specialty: Family Medicine Contact information: [address] [place] [address] [phone]            The results of significant diagnostics from this hospitalization (including imaging, microbiology, ancillary and laboratory) are listed below for reference.    Significant Diagnostic Studies: Ct Head Wo Contrast  Result Date: 06/16/2019 CLINICAL DATA:  Altered mental status. EXAM: CT HEAD WITHOUT CONTRAST TECHNIQUE:  Contiguous axial images were obtained from the base of the skull through the vertex without intravenous contrast. COMPARISON:  05/10/2006 FINDINGS: Brain: No evidence of acute infarction, hemorrhage, hydrocephalus, extra-axial collection or mass lesion/mass effect. Chronic changes of left caudate, right thalamus and bilateral cerebellar infarcts. Moderate brain parenchymal volume loss and deep white matter microangiopathy. Vascular: Calcific atherosclerotic disease. Skull: Normal. Negative for fracture or focal lesion. Sinuses/Orbits: No acute finding. Other: None. IMPRESSION: 1. No acute intracranial abnormality. 2. Atrophy, chronic microvascular disease. 3. Chronic infarcts of the left caudate, right thalamus and bilateral cerebellar hemispheres. Electronically Signed   By: Fidela Salisbury M.D.   On: 06/16/2019 16:43   Dg Chest Port 1 View  Result Date: 06/21/2019 CLINICAL DATA:  Fluid overload EXAM: PORTABLE CHEST 1 VIEW COMPARISON:  June 16, 2019 FINDINGS: No edema or consolidation. Heart size and pulmonary vascularity are  normal. No adenopathy. There is aortic atherosclerosis. No bone lesions. IMPRESSION: No edema or consolidation. Cardiac silhouette within normal limits. Aortic Atherosclerosis (ICD10-I70.0). Electronically Signed   By: Lowella Grip III M.D.   On: 06/21/2019 08:39   Dg Chest Port 1 View  Result Date: 06/16/2019 CLINICAL DATA:  Failure to thrive. EXAM: PORTABLE CHEST 1 VIEW COMPARISON:  05/07/2016 FINDINGS: Patient is moderately rotated to the left. Lungs are hypoinflated without focal airspace consolidation, effusion or pneumothorax. Cardiomediastinal silhouette and remainder of the exam is unchanged. IMPRESSION: Hypoinflation without acute cardiopulmonary disease. Electronically Signed   By: Marin Olp M.D.   On: 06/16/2019 14:31    Microbiology: Recent Results (from the past 240 hour(s))  Novel Coronavirus,NAA,(SEND-OUT TO REF LAB - TAT 24-48 hrs); Hosp Order      Status: None   Collection Time: 06/16/19  3:28 PM   Specimen: Nasopharyngeal Swab; Respiratory  Result Value Ref Range Status   SARS-CoV-2, NAA NOT DETECTED NOT DETECTED Final    Comment: (NOTE) Testing was performed using the cobas(R) SARS-CoV-2 test. This test was developed and its performance characteristics determined by [employer]. This test has not been FDA cleared or approved. This test has been authorized by FDA under an Emergency Use Authorization (EUA). This test is only authorized for the duration of time the declaration that circumstances exist justifying the authorization of the emergency use of in vitro diagnostic tests for detection of SARS-CoV-2 virus and/or diagnosis of COVID-19 infection under section 564(b)(1) of the Act, 21 U.S.C. 761YWV-3(X)(1), unless the authorization is terminated or revoked sooner. When diagnostic testing is negative, the possibility of a false negative result should be considered in the context of a patient's recent exposures and the presence of clinical signs and symptoms consistent with COVID-19. An individual without symptoms of COVID-19 and who is not shedding SARS-CoV-2 virus would expect to have  a negative (not detected) result in this assay. Performed At: [facility] [place], [place] [id number] Rush Farmer MD OE:[id number]    [place]  Final    Comment: Performed at [facility] [address], [place], [place] [address]  Urine culture     Status: Abnormal   Collection Time: 06/17/19 12:47 AM   Specimen: Urine, Random  Result Value Ref Range Status   Specimen Description   Final    URINE, RANDOM Performed at [facility] [address] [place], [place] [address]    Special Requests   Final    NONE Performed at [facility], [place] [address] [place], [place] [address]    Culture MULTIPLE SPECIES PRESENT,  SUGGEST RECOLLECTION (A)  Final   Report Status 06/18/2019 FINAL  Final  MRSA PCR Screening     Status: None   Collection Time: 06/17/19  2:11 AM   Specimen: Nasal Mucosa; Nasopharyngeal  Result Value Ref Range Status   MRSA by PCR NEGATIVE NEGATIVE Final    Comment:        The GeneXpert MRSA Assay (FDA approved for NASAL specimens only), is one component of a comprehensive MRSA colonization surveillance program. It is not intended to diagnose MRSA infection nor to guide or monitor treatment for MRSA infections. Performed at [facility], [place] [address], [place], [place] [address]      Labs: Basic Metabolic Panel: Recent Labs  Lab 06/18/19 0222 06/19/19 0245 06/20/19 0536 06/21/19 0442 06/22/19 0502  NA 140 139 140 142 142  K 3.3*  3.1* 3.3* 3.2* 3.5  CL 101 101 102 102 106  CO2 26 23 25  32 25  GLUCOSE 95 152* 187* 197* 201*  BUN 101* 68* 51* 48* 38*  CREATININE 1.37* 1.29* 1.53* 1.61* 1.36*  CALCIUM 8.0* 7.3* 7.0* 7.1* 7.3*   Liver Function Tests: Recent Labs  Lab 06/16/19 1747  AST 22  ALT 17  ALKPHOS 92  BILITOT 0.7  PROT 6.5  ALBUMIN 3.3*   Recent Labs  Lab 06/16/19 1747  LIPASE 34   No results for input(s): AMMONIA in the last 168 hours. CBC: Recent Labs  Lab 06/16/19 1458 06/17/19 0207 06/18/19 0222 06/19/19 0245 06/20/19 0536  WBC 18.9* 13.7* 11.8* 10.3 11.8*  NEUTROABS 16.9* 11.9*  --   --  8.4*  HGB 16.1* 14.5 14.6 13.3 12.6  HCT 49.4* 42.1 42.3 40.2   38.4 39.7  MCV 88.5 85.4 84.8 87.6 90.6  PLT 301 252 252 237 233   Cardiac Enzymes: Recent Labs  Lab 06/16/19 1747 06/16/19 2115 06/16/19 2116 06/17/19 0207  CKTOTAL  --   --  172  --   TROPONINI 0.08* 0.08*  --  0.07*   BNP: BNP (last 3 results) Recent Labs    06/16/19 1458  BNP 149.9*    ProBNP (last 3 results) No results for input(s): PROBNP in the last 8760 hours.  CBG: Recent Labs  Lab 06/21/19 1118 06/21/19 1637 06/21/19 2143 06/22/19 0757  06/22/19 1118  GLUCAP 184* 149* 145* 193* 265*       Signed:  Alma Friendly, MD [facility] 06/22/2019, 12:38 PM

## 2019-07-17 HISTORY — PX: CATARACT EXTRACTION: SUR2

## 2019-09-13 ENCOUNTER — Encounter (HOSPITAL_COMMUNITY): Payer: Self-pay | Admitting: Internal Medicine

## 2019-09-13 ENCOUNTER — Observation Stay (HOSPITAL_COMMUNITY)
Admission: EM | Admit: 2019-09-13 | Discharge: 2019-09-14 | Disposition: A | Discharge status: Home / Self Care | Payer: Medicare Other | Attending: Internal Medicine | Admitting: Internal Medicine

## 2019-09-13 ENCOUNTER — Other Ambulatory Visit: Payer: Self-pay

## 2019-09-13 ENCOUNTER — Emergency Department (HOSPITAL_COMMUNITY): Payer: Medicare Other

## 2019-09-13 DIAGNOSIS — N179 Acute kidney failure, unspecified: Secondary | ICD-10-CM | POA: Diagnosis not present

## 2019-09-13 DIAGNOSIS — Z794 Long term (current) use of insulin: Secondary | ICD-10-CM | POA: Diagnosis not present

## 2019-09-13 DIAGNOSIS — I5032 Chronic diastolic (congestive) heart failure: Secondary | ICD-10-CM | POA: Diagnosis not present

## 2019-09-13 DIAGNOSIS — Z87891 Personal history of nicotine dependence: Secondary | ICD-10-CM | POA: Insufficient documentation

## 2019-09-13 DIAGNOSIS — K529 Noninfective gastroenteritis and colitis, unspecified: Secondary | ICD-10-CM | POA: Diagnosis not present

## 2019-09-13 DIAGNOSIS — Z993 Dependence on wheelchair: Secondary | ICD-10-CM | POA: Insufficient documentation

## 2019-09-13 DIAGNOSIS — Z7902 Long term (current) use of antithrombotics/antiplatelets: Secondary | ICD-10-CM | POA: Insufficient documentation

## 2019-09-13 DIAGNOSIS — I10 Essential (primary) hypertension: Secondary | ICD-10-CM | POA: Diagnosis present

## 2019-09-13 DIAGNOSIS — E875 Hyperkalemia: Secondary | ICD-10-CM | POA: Diagnosis present

## 2019-09-13 DIAGNOSIS — R109 Unspecified abdominal pain: Secondary | ICD-10-CM | POA: Diagnosis present

## 2019-09-13 DIAGNOSIS — K5900 Constipation, unspecified: Secondary | ICD-10-CM | POA: Diagnosis present

## 2019-09-13 DIAGNOSIS — E1151 Type 2 diabetes mellitus with diabetic peripheral angiopathy without gangrene: Secondary | ICD-10-CM | POA: Diagnosis present

## 2019-09-13 DIAGNOSIS — E1165 Type 2 diabetes mellitus with hyperglycemia: Secondary | ICD-10-CM | POA: Insufficient documentation

## 2019-09-13 DIAGNOSIS — Z79899 Other long term (current) drug therapy: Secondary | ICD-10-CM | POA: Diagnosis not present

## 2019-09-13 DIAGNOSIS — I69354 Hemiplegia and hemiparesis following cerebral infarction affecting left non-dominant side: Secondary | ICD-10-CM | POA: Insufficient documentation

## 2019-09-13 DIAGNOSIS — E860 Dehydration: Secondary | ICD-10-CM

## 2019-09-13 DIAGNOSIS — K5641 Fecal impaction: Secondary | ICD-10-CM | POA: Insufficient documentation

## 2019-09-13 DIAGNOSIS — Z20828 Contact with and (suspected) exposure to other viral communicable diseases: Secondary | ICD-10-CM | POA: Insufficient documentation

## 2019-09-13 DIAGNOSIS — Z23 Encounter for immunization: Secondary | ICD-10-CM | POA: Diagnosis not present

## 2019-09-13 DIAGNOSIS — I110 Hypertensive heart disease with heart failure: Secondary | ICD-10-CM | POA: Insufficient documentation

## 2019-09-13 LAB — CBC WITH DIFFERENTIAL/PLATELET
Abs Immature Granulocytes: 0.08 10*3/uL — ABNORMAL HIGH (ref 0.00–0.07)
Basophils Absolute: 0.1 10*3/uL (ref 0.0–0.1)
Basophils Relative: 0 %
Eosinophils Absolute: 0.1 10*3/uL (ref 0.0–0.5)
Eosinophils Relative: 0 %
HCT: 40.1 % (ref 36.0–46.0)
Hemoglobin: 12.9 g/dL (ref 12.0–15.0)
Immature Granulocytes: 1 %
Lymphocytes Relative: 4 %
Lymphs Abs: 0.6 10*3/uL — ABNORMAL LOW (ref 0.7–4.0)
MCH: 30.9 pg (ref 26.0–34.0)
MCHC: 32.2 g/dL (ref 30.0–36.0)
MCV: 96.2 fL (ref 80.0–100.0)
Monocytes Absolute: 0.6 10*3/uL (ref 0.1–1.0)
Monocytes Relative: 4 %
Neutro Abs: 12.6 10*3/uL — ABNORMAL HIGH (ref 1.7–7.7)
Neutrophils Relative %: 91 %
Platelets: 211 10*3/uL (ref 150–400)
RBC: 4.17 MIL/uL (ref 3.87–5.11)
RDW: 13.8 % (ref 11.5–15.5)
WBC: 14.1 10*3/uL — ABNORMAL HIGH (ref 4.0–10.5)
nRBC: 0.2 % (ref 0.0–0.2)

## 2019-09-13 LAB — BASIC METABOLIC PANEL
Anion gap: 9 (ref 5–15)
BUN: 97 mg/dL — ABNORMAL HIGH (ref 8–23)
CO2: 23 mmol/L (ref 22–32)
Calcium: 8.5 mg/dL — ABNORMAL LOW (ref 8.9–10.3)
Chloride: 109 mmol/L (ref 98–111)
Creatinine, Ser: 1.72 mg/dL — ABNORMAL HIGH (ref 0.44–1.00)
GFR calc Af Amer: 34 mL/min — ABNORMAL LOW (ref >60)
GFR calc non Af Amer: 30 mL/min — ABNORMAL LOW (ref >60)
Glucose, Bld: 129 mg/dL — ABNORMAL HIGH (ref 70–99)
Potassium: 3.8 mmol/L (ref 3.5–5.1)
Sodium: 141 mmol/L (ref 135–145)

## 2019-09-13 LAB — COMPREHENSIVE METABOLIC PANEL
ALT: 12 U/L (ref 0–44)
AST: 14 U/L — ABNORMAL LOW (ref 15–41)
Albumin: 3.7 g/dL (ref 3.5–5.0)
Alkaline Phosphatase: 103 U/L (ref 38–126)
Anion gap: 16 — ABNORMAL HIGH (ref 5–15)
BUN: 121 mg/dL — ABNORMAL HIGH (ref 8–23)
CO2: 19 mmol/L — ABNORMAL LOW (ref 22–32)
Calcium: 9.2 mg/dL (ref 8.9–10.3)
Chloride: 103 mmol/L (ref 98–111)
Creatinine, Ser: 2 mg/dL — ABNORMAL HIGH (ref 0.44–1.00)
GFR calc Af Amer: 29 mL/min — ABNORMAL LOW (ref >60)
GFR calc non Af Amer: 25 mL/min — ABNORMAL LOW (ref >60)
Glucose, Bld: 218 mg/dL — ABNORMAL HIGH (ref 70–99)
Potassium: 5.3 mmol/L — ABNORMAL HIGH (ref 3.5–5.1)
Sodium: 138 mmol/L (ref 135–145)
Total Bilirubin: 0.4 mg/dL (ref 0.3–1.2)
Total Protein: 7.4 g/dL (ref 6.5–8.1)

## 2019-09-13 LAB — URINALYSIS, ROUTINE W REFLEX MICROSCOPIC
Bilirubin Urine: NEGATIVE
Glucose, UA: NEGATIVE mg/dL
Hgb urine dipstick: NEGATIVE
Ketones, ur: NEGATIVE mg/dL
Leukocytes,Ua: NEGATIVE
Nitrite: NEGATIVE
Protein, ur: NEGATIVE mg/dL
Specific Gravity, Urine: 1.009 (ref 1.005–1.030)
pH: 5 (ref 5.0–8.0)

## 2019-09-13 LAB — GLUCOSE, CAPILLARY
Glucose-Capillary: 151 mg/dL — ABNORMAL HIGH (ref 70–99)
Glucose-Capillary: 117 mg/dL — ABNORMAL HIGH (ref 70–99)
Glucose-Capillary: 117 mg/dL — ABNORMAL HIGH (ref 70–99)
Glucose-Capillary: 127 mg/dL — ABNORMAL HIGH (ref 70–99)

## 2019-09-13 LAB — LIPASE, BLOOD: Lipase: 46 U/L (ref 11–51)

## 2019-09-13 LAB — MRSA PCR SCREENING: MRSA by PCR: NEGATIVE

## 2019-09-13 LAB — SARS CORONAVIRUS 2 (TAT 6-24 HRS): SARS Coronavirus 2: NEGATIVE

## 2019-09-13 MED ORDER — INSULIN ASPART 100 UNIT/ML ~~LOC~~ SOLN: 0.0000 [IU] | Freq: Every day | SUBCUTANEOUS | Status: DC

## 2019-09-13 MED ORDER — CLOPIDOGREL BISULFATE 75 MG PO TABS
75.0000 mg | ORAL_TABLET | Freq: Every day | ORAL | Status: DC
  Administered 2019-09-13 – 2019-09-14 (×2): 75 mg via ORAL
  Filled 2019-09-13 (×2): qty 1

## 2019-09-13 MED ORDER — POLYETHYLENE GLYCOL 3350 17 G PO PACK
17.0000 g | PACK | Freq: Every day | ORAL | Status: DC
  Administered 2019-09-13 – 2019-09-14 (×2): 17 g via ORAL
  Filled 2019-09-13 (×2): qty 1

## 2019-09-13 MED ORDER — SODIUM CHLORIDE 0.9 % IV SOLN
2.0000 g | Freq: Every day | INTRAVENOUS | Status: DC
  Administered 2019-09-13 – 2019-09-14 (×2): 2 g via INTRAVENOUS
  Filled 2019-09-13: qty 2
  Filled 2019-09-13: qty 20

## 2019-09-13 MED ORDER — INFLUENZA VAC A&B SA ADJ QUAD 0.5 ML IM PRSY
0.5000 mL | PREFILLED_SYRINGE | INTRAMUSCULAR | Status: AC
  Administered 2019-09-14: 0.5 mL via INTRAMUSCULAR
  Filled 2019-09-13: qty 0.5

## 2019-09-13 MED ORDER — INSULIN GLARGINE 100 UNIT/ML ~~LOC~~ SOLN
25.0000 [IU] | Freq: Every day | SUBCUTANEOUS | Status: DC
  Administered 2019-09-13 – 2019-09-14 (×2): 25 [IU] via SUBCUTANEOUS
  Filled 2019-09-13 (×2): qty 0.25

## 2019-09-13 MED ORDER — INSULIN ASPART 100 UNIT/ML ~~LOC~~ SOLN
0.0000 [IU] | Freq: Three times a day (TID) | SUBCUTANEOUS | Status: DC
  Administered 2019-09-14: 2 [IU] via SUBCUTANEOUS

## 2019-09-13 MED ORDER — ACETAMINOPHEN 325 MG PO TABS: 650.0000 mg | ORAL_TABLET | Freq: Four times a day (QID) | ORAL | Status: DC | PRN

## 2019-09-13 MED ORDER — SODIUM CHLORIDE 0.9 % IV SOLN
INTRAVENOUS | Status: DC
  Administered 2019-09-13 – 2019-09-14 (×3): via INTRAVENOUS

## 2019-09-13 MED ORDER — INSULIN ASPART 100 UNIT/ML ~~LOC~~ SOLN
0.0000 [IU] | Freq: Three times a day (TID) | SUBCUTANEOUS | Status: DC
  Administered 2019-09-13: 2 [IU] via SUBCUTANEOUS

## 2019-09-13 MED ORDER — MILK AND MOLASSES ENEMA
1.0000 | Freq: Once | RECTAL | Status: DC
  Filled 2019-09-13: qty 240

## 2019-09-13 MED ORDER — METRONIDAZOLE IN NACL 5-0.79 MG/ML-% IV SOLN: 500.0000 mg | Freq: Once | INTRAVENOUS | Status: DC

## 2019-09-13 MED ORDER — AMLODIPINE BESYLATE 5 MG PO TABS
5.0000 mg | ORAL_TABLET | Freq: Every day | ORAL | Status: DC
  Administered 2019-09-13 – 2019-09-14 (×2): 5 mg via ORAL
  Filled 2019-09-13 (×2): qty 1

## 2019-09-13 MED ORDER — ONDANSETRON HCL 4 MG/2ML IJ SOLN: 4.0000 mg | Freq: Four times a day (QID) | INTRAMUSCULAR | Status: DC | PRN

## 2019-09-13 MED ORDER — SIMVASTATIN 20 MG PO TABS
20.0000 mg | ORAL_TABLET | Freq: Every day | ORAL | Status: DC
  Administered 2019-09-14: 20 mg via ORAL
  Filled 2019-09-13: qty 1

## 2019-09-13 MED ORDER — HEPARIN SODIUM (PORCINE) 5000 UNIT/ML IJ SOLN
5000.0000 [IU] | Freq: Three times a day (TID) | INTRAMUSCULAR | Status: DC
  Administered 2019-09-13 – 2019-09-14 (×4): 5000 [IU] via SUBCUTANEOUS
  Filled 2019-09-13 (×4): qty 1

## 2019-09-13 MED ORDER — ACETAMINOPHEN 650 MG RE SUPP: 650.0000 mg | Freq: Four times a day (QID) | RECTAL | Status: DC | PRN

## 2019-09-13 MED ORDER — SODIUM CHLORIDE 0.9 % IV BOLUS
1000.0000 mL | Freq: Once | INTRAVENOUS | Status: AC
  Administered 2019-09-13: 1000 mL via INTRAVENOUS

## 2019-09-13 MED ORDER — METRONIDAZOLE IN NACL 5-0.79 MG/ML-% IV SOLN
500.0000 mg | Freq: Three times a day (TID) | INTRAVENOUS | Status: DC
  Administered 2019-09-13 – 2019-09-14 (×5): 500 mg via INTRAVENOUS
  Filled 2019-09-13 (×5): qty 100

## 2019-09-13 MED ORDER — ONDANSETRON HCL 4 MG PO TABS: 4.0000 mg | ORAL_TABLET | Freq: Four times a day (QID) | ORAL | Status: DC | PRN

## 2019-09-13 MED ORDER — PNEUMOCOCCAL VAC POLYVALENT 25 MCG/0.5ML IJ INJ
0.5000 mL | INJECTION | INTRAMUSCULAR | Status: AC
  Administered 2019-09-14: 0.5 mL via INTRAMUSCULAR
  Filled 2019-09-13: qty 0.5

## 2019-09-13 MED ORDER — SODIUM ZIRCONIUM CYCLOSILICATE 10 G PO PACK
10.0000 g | PACK | Freq: Once | ORAL | Status: AC
  Administered 2019-09-13: 04:00:00 10 g via ORAL
  Filled 2019-09-13: qty 1

## 2019-09-13 MED ORDER — LABETALOL HCL 100 MG PO TABS
100.0000 mg | ORAL_TABLET | Freq: Two times a day (BID) | ORAL | Status: DC
  Administered 2019-09-13 – 2019-09-14 (×3): 100 mg via ORAL
  Filled 2019-09-13 (×3): qty 1

## 2019-09-13 MED ORDER — CIPROFLOXACIN IN D5W 400 MG/200ML IV SOLN: 400.0000 mg | Freq: Once | INTRAVENOUS | Status: DC

## 2019-09-13 NOTE — Progress Notes (Addendum)
PROGRESS NOTE                                                                                                                                                                                                             Patient Demographics:    Angela Hays, is a 68 y.o. female, DOB - 1951-06-10, [id number]  Admit date - 09/13/2019   Admitting Physician Etta Quill, DO  Outpatient Primary MD for the patient is Maury Dus, MD  LOS - 0    Chief Complaint  Patient presents with  . Flank Pain  . Constipation       Brief Narrative   68 year old female with history of stroke and left-sided weakness, wheelchair-bound, type 2 diabetes mellitus and hypertension presented to the ED with right lower quadrant abdominal pain radiating to the back and constipation. In the ED she had leukocytosis (14 K), AKI with BUN of 121 and creatinine of 2 (was 38 and 1.3 few months back).  CT abdomen and pelvis showed fecal impaction with stercoral colitis. Admitted for IV antibiotics, IV fluids and enema.   Subjective:   Patient reports that she had 3 bowel movements after the enema and abdominal pain is better this morning.   Assessment  & Plan :    Principal Problem: Acute stercoral colitis with constipation Abdominal pain symptoms improving after enema and reports 3 bowel movements.  On empiric IV Rocephin and Flagyl. Add daily MiraLAX.  Patient encouraged to keep herself hydrated.    AKI (acute kidney injury) ([place]) Suspect prerenal with dehydration.  Avoid nephrotoxins.  IV fluids.  Repeat labs in a.m.  Active Problems:     DM (diabetes mellitus), type 2 with peripheral vascular complications, uncontrolled with hyperglycemia (HCC) A1c of 10.7, 3 months back.  On Lantus 15 units twice daily.  I will place her on 25 units daily with moderate sliding scale coverage.  Essential hypertension Resume labetalol and amlodipine.   Hold Lasix and Cardura for now.  History of stroke Resume statin and Plavix     Code Status : Full code  Family Communication  : None  Disposition Plan  : Home possibly tomorrow if abdominal pain, AKI improved. Lives with her son  Barriers For Discharge : Active symptoms  Consults  : None  Procedures  : CT abdomen pelvis  DVT Prophylaxis  :  Subcu heparin  Lab Results  Component Value Date   PLT 211 09/13/2019    Antibiotics  :    Anti-infectives (From admission, onward)   Start     Dose/Rate Route Frequency Ordered Stop   09/13/19 0500  metroNIDAZOLE (FLAGYL) IVPB 500 mg     500 mg 100 mL/hr over 60 Minutes Intravenous Every 8 hours 09/13/19 0412     09/13/19 0430  cefTRIAXone (ROCEPHIN) 2 g in sodium chloride 0.9 % 100 mL IVPB     2 g 200 mL/hr over 30 Minutes Intravenous Daily 09/13/19 0412     09/13/19 0400  ciprofloxacin (CIPRO) IVPB 400 mg  Status:  Discontinued     400 mg 200 mL/hr over 60 Minutes Intravenous  Once 09/13/19 0357 09/13/19 0412   09/13/19 0400  metroNIDAZOLE (FLAGYL) IVPB 500 mg  Status:  Discontinued     500 mg 100 mL/hr over 60 Minutes Intravenous  Once 09/13/19 0357 09/13/19 0412        Objective:   Vitals:   09/13/19 0200 09/13/19 0230 09/13/19 0300 09/13/19 0610  BP: 124/64 116/76 114/62 111/65  Pulse: 79 82 84 76  Resp: (!) 23 18 (!) 23 20  Temp:    98.7 F (37.1 C)  TempSrc:    Oral  SpO2: 96% 98% 96% 98%  Weight:    61.4 kg  Height:    5\' 3"  (1.6 m)    Wt Readings from Last 3 Encounters:  09/13/19 61.4 kg  06/22/19 60 kg  08/21/18 66.8 kg     Intake/Output Summary (Last 24 hours) at 09/13/2019 0914 Last data filed at 09/13/2019 0817 Gross per 24 hour  Intake -  Output 250 ml  Net -250 ml     Physical Exam  Gen: not in distress HEENT:moist mucosa, supple neck Chest: clear b/l, no added sounds CVS: N S1&S2, no murmurs,  GI: soft,  ND, BS+, mild RLQ tenderness Musculoskeletal: warm, no edema     Data  Review:    CBC Recent Labs  Lab 09/13/19 0108  WBC 14.1*  HGB 12.9  HCT 40.1  PLT 211  MCV 96.2  MCH 30.9  MCHC 32.2  RDW 13.8  LYMPHSABS 0.6*  MONOABS 0.6  EOSABS 0.1  BASOSABS 0.1    Chemistries  Recent Labs  Lab 09/13/19 0108  NA 138  K 5.3*  CL 103  CO2 19*  GLUCOSE 218*  BUN 121*  CREATININE 2.00*  CALCIUM 9.2  AST 14*  ALT 12  ALKPHOS 103  BILITOT 0.4   ------------------------------------------------------------------------------------------------------------------ No results for input(s): CHOL, HDL, LDLCALC, TRIG, CHOLHDL, LDLDIRECT in the last 72 hours.  Lab Results  Component Value Date   HGBA1C 10.7 (H) 06/17/2019   ------------------------------------------------------------------------------------------------------------------ No results for input(s): TSH, T4TOTAL, T3FREE, THYROIDAB in the last 72 hours.  Invalid input(s): FREET3 ------------------------------------------------------------------------------------------------------------------ No results for input(s): VITAMINB12, FOLATE, FERRITIN, TIBC, IRON, RETICCTPCT in the last 72 hours.  Coagulation profile No results for input(s): INR, PROTIME in the last 168 hours.  No results for input(s): DDIMER in the last 72 hours.  Cardiac Enzymes No results for input(s): CKMB, TROPONINI, MYOGLOBIN in the last 168 hours.  Invalid input(s): CK ------------------------------------------------------------------------------------------------------------------    Component Value Date/Time   BNP 149.9 (H) 06/16/2019 1458    Inpatient Medications  Scheduled Meds: . heparin  5,000 Units Subcutaneous Q8H  . insulin aspart  0-9 Units Subcutaneous TID WC  . milk and molasses  1 enema Rectal Once   Continuous  Infusions: . sodium chloride 125 mL/hr at 09/13/19 0619  . cefTRIAXone (ROCEPHIN)  IV 2 g (09/13/19 0506)  . metronidazole 500 mg (09/13/19 0621)   PRN Meds:.acetaminophen **OR**  acetaminophen, ondansetron **OR** ondansetron (ZOFRAN) IV  Micro Results No results found for this or any previous visit (from the past 240 hour(s)).  Radiology Reports Ct Abdomen Pelvis Wo Contrast  Result Date: 09/13/2019 CLINICAL DATA:  Generalized abdominal pain EXAM: CT ABDOMEN AND PELVIS WITHOUT CONTRAST TECHNIQUE: Multidetector CT imaging of the abdomen and pelvis was performed following the standard protocol without IV contrast. COMPARISON:  None. FINDINGS: Lower chest: Lung bases are free of acute infiltrate or sizable effusion. Hepatobiliary: No focal liver abnormality is seen. No gallstones, gallbladder wall thickening, or biliary dilatation. Pancreas: Unremarkable. No pancreatic ductal dilatation or surrounding inflammatory changes. Spleen: Normal in size without focal abnormality. Adrenals/Urinary Tract: Adrenal glands demonstrates some mild hyperplasia. Small nonobstructing renal calculi are noted bilaterally. The ureters are within normal limits. The bladder is decompressed. Stomach/Bowel: Changes of fecal impaction in the rectum are noted. Some mild wall thickening is noted which may represent some stercoral colitis. No obstructive changes are noted proximally. The appendix is within normal limits. No small bowel or gastric abnormality is seen. Vascular/Lymphatic: Aortic atherosclerosis. No enlarged abdominal or pelvic lymph nodes. Reproductive: Status post hysterectomy. No adnexal masses. Other: No free fluid is noted. Musculoskeletal: Degenerative changes of lumbar spine are seen. IMPRESSION: Rectal impaction with mild changes of stercoral colitis. Tiny nonobstructing renal calculi bilaterally. No other acute abnormality is seen. Electronically Signed   By: Inez Catalina M.D.   On: 09/13/2019 03:44    Time Spent in minutes  25   Cristabel Bicknell M.D on 09/13/2019 at 9:14 AM  Between 7am to 7pm - Pager - [phone]  After 7pm go to [email or web address] - password [facility]  [facility]  -  Office  [phone]

## 2019-09-13 NOTE — ED Provider Notes (Signed)
[place] DEPT Provider Note   CSN: [id number] Arrival date & time: 09/13/19  [id number]     History   Chief Complaint Chief Complaint  Patient presents with  . Flank Pain  . Constipation    HPI Angela Hays is a 68 y.o. female.     Patient with past medical history notable for prior stroke, hypertension, diabetes, presents to the emergency department with a chief complaint of right upper quadrant abdominal pain.  She also states that she has some pain in her back.  She denies any fevers, chills, nausea, or vomiting.  She denies any injury to the area.  She denies any cough or shortness of breath.  Denies any prior abdominal surgeries.  States the pain is worsened with palpation  The history is provided by the patient. No language interpreter was used.    Past Medical History:  Diagnosis Date  . Diabetes mellitus without complication ([place])   . Hypertension   . Stroke [facility])     Patient Active Problem List   Diagnosis Date Noted  . Weakness   . Palliative care by specialist   . Goals of care, counseling/discussion   . Pressure injury of skin 06/17/2019  . Hyperkalemia 06/16/2019  . Renal insufficiency 06/16/2019  . Hyponatremia 06/16/2019  . Metabolic acidosis [id number]  . Chronic diastolic CHF (congestive heart failure) ([place]) 06/16/2019  . Hypotension due to hypovolemia 06/16/2019  . Elevated troponin 06/16/2019  . [place]idal intertrigo 06/16/2019  . AKI (acute kidney injury) ([place])   . ARF (acute renal failure) ([place]) 05/11/2016  . Leg ulcer ([place]) 05/11/2016  . Cellulitis and abscess of leg 05/06/2016  . Sepsis ([place]) 01/05/2016  . Pressure ulcer 01/05/2016  . Leg swelling 01/05/2016  . DM (diabetes mellitus), type 2 with peripheral vascular complications ([place]) [id number]  . Hypertensive urgency 01/05/2016    No past surgical history on file.   OB History   No obstetric history on file.      Home Medications    Prior to  Admission medications   Medication Sig Start Date End Date Taking? Authorizing Provider  clopidogrel (PLAVIX) 75 MG tablet Take 75 mg by mouth daily.    [provider]  clotrimazole (LOTRIMIN) 1 % cream Apply topically 2 (two) times daily. 06/22/19   Alma Friendly, MD  doxazosin (CARDURA) 4 MG tablet Take 4 mg by mouth at bedtime. 05/17/19   [provider]  DUREZOL 0.05 % EMUL Place 1 drop into the right eye as directed. Instill 1 drop BID IN OPERATIVE EYE Starting 2 Days Prior to Surgery and After Surgery for 3 Weeks 06/04/19   [provider]  furosemide (LASIX) 40 MG tablet Take 0.5 tablets (20 mg total) by mouth daily as needed for fluid or edema (for weight gain of more then 2 Lbs in 1 day). Patient taking differently: Take 80 mg by mouth daily.  05/15/16   Lavina Hamman, MD  Insulin Glargine (BASAGLAR KWIKPEN) 100 UNIT/ML SOPN Inject 15 Units into the skin 2 (two) times daily.  07/09/18   [provider]  KLOR-CON M20 20 MEQ tablet Take 20 mEq by mouth daily. 05/24/18   [provider]  labetalol (NORMODYNE) 100 MG tablet Take 100 mg by mouth 2 (two) times daily. 06/06/18   [provider]  ofloxacin (OCUFLOX) 0.3 % ophthalmic solution Place 1 drop into the right eye as directed. Instill 1 drop BID IN OPERATIVE EYE Starting 2 Days Prior to  Surgery and After Surgery for 3 Weeks 06/04/19   [provider]  simvastatin (ZOCOR) 20 MG tablet Take 20 mg by mouth daily with breakfast.    [provider]    Family History No family history on file.  Social History Social History   Tobacco Use  . Smoking status: Former Research scientist (life sciences)  . Smokeless tobacco: Never Used  Substance Use Topics  . Alcohol use: No  . Drug use: No     Allergies   Patient has no known allergies.   Review of Systems Review of Systems  All other systems reviewed and are negative.    Physical Exam Updated Vital Signs BP 124/65 (BP Location:  Left Arm)   Pulse 95   Temp 98.3 F (36.8 C) (Oral)   Resp 18   SpO2 95%   Physical Exam Vitals signs and nursing note reviewed.  Constitutional:      General: She is not in acute distress.    Appearance: She is well-developed.  HENT:     Head: Normocephalic and atraumatic.  Eyes:     Conjunctiva/sclera: Conjunctivae normal.  Neck:     Musculoskeletal: Neck supple.  Cardiovascular:     Rate and Rhythm: Normal rate and regular rhythm.     Heart sounds: No murmur.  Pulmonary:     Effort: Pulmonary effort is normal. No respiratory distress.     Breath sounds: Normal breath sounds.  Abdominal:     Palpations: Abdomen is soft.     Tenderness: There is abdominal tenderness.     Comments: RUQ TTP  Musculoskeletal: Normal range of motion.  Skin:    General: Skin is warm and dry.  Neurological:     Mental Status: She is alert and oriented to person, place, and time.  Psychiatric:        Mood and Affect: Mood normal.        Behavior: Behavior normal.      ED Treatments / Results  Labs (all labs ordered are listed, but only abnormal results are displayed) Labs Reviewed  CBC WITH DIFFERENTIAL/PLATELET  COMPREHENSIVE METABOLIC PANEL  LIPASE, BLOOD  URINALYSIS, ROUTINE W REFLEX MICROSCOPIC    EKG None  Radiology No results found.  Procedures Procedures (including critical care time)  Medications Ordered in ED Medications - No data to display   Initial Impression / Assessment and Plan / ED Course  I have reviewed the triage vital signs and the nursing notes.  Pertinent labs & imaging results that were available during my care of the patient were reviewed by me and considered in my medical decision making (see chart for details).        Patient here with abdominal pain.  Labs show leukocytosis.  Likely prerenal AKI, Cr 2.00, BUN 121, K 5.3.  Given 10mg  Lokelma per Dr. Randal Buba for hyperkalemia.  CT shows fecal impaction and stercoral colitis.  Will give milk  of molasses enema.  Did have good BM after triage.  Patient seen by and discussed with Dr. Randal Buba, who agrees with plan for admission.  Final Clinical Impressions(s) / ED Diagnoses   Final diagnoses:  Dehydration  Hyperkalemia  Colitis    ED Discharge Orders    None       Montine Circle, PA-C 09/13/19 [place], April, MD 09/13/19 [phone]

## 2019-09-13 NOTE — Progress Notes (Signed)
PT Cancellation Note / Screen  Patient Details Name: Angela Hays MRN: [id number] DOB: 1951/01/25   Cancelled Treatment:    Reason Eval/Treat Not Completed: PT screened, no needs identified, will sign off  Per chart and RN, pt lives with son and total care.  Son assists with bathing and dressing as well as transfers pt to w/c.  Pt with hx of stroke and left sided weakness.  Pt also has LE contractures per previous admission documentation and RN reports L UE in flexion and pt does not use.  Pt appears at baseline in regards to mobility so PT to sign off.   [place] Scorsone,KATHrine E 09/13/2019, 11:12 AM Carmelia Bake, PT, DPT Acute Rehabilitation Services Office: [phone] Pager: [phone]

## 2019-09-13 NOTE — Progress Notes (Signed)
PHARMACY NOTE:  ANTIMICROBIAL  DOSAGE ADJUSTMENT  Current antimicrobial regimen includes a mismatch between antimicrobial dosage and indication.  As per policy approved by the Pharmacy & Therapeutics and Medical Executive Committees, the antimicrobial dosage will be adjusted accordingly.  Current antimicrobial dosage:  Rocephin 1 Gm IV q24h  Indication: IAI  Renal Function:  CrCl cannot be calculated (Unknown ideal weight.). []      On intermittent HD, scheduled: []      On CRRT    Antimicrobial dosage has been changed to:  Rocephin 2 Gm IV q24h    Thank you for allowing pharmacy to be a part of this patient's care.  Dorrene German, [facility] 09/13/2019 5:39 AM

## 2019-09-13 NOTE — H&P (Signed)
History and Physical    Angela Hays [id number] DOB: Apr 15, 1951 DOA: 09/13/2019  PCP: Maury Dus, MD  Patient coming from: Home  I have personally briefly reviewed patient's old medical records in [place]  Chief Complaint: Flank pain, constipation  HPI: Angela Hays is a 68 y.o. female with medical history significant of prior stroke, HTN, DM2.  Patient presents to the ED with c/o RUQ abd pain and constipation.  Also some pain in back.  Patient reports minimal bowel movements and sensation of constipation.  Patient quite immobile at baseline, wheelchair bound, due to prior stroke.  No fevers, chills, N/V.  ED Course: CT abd/pelvis reveals fecal impaction with stercoral colitis.  WBC 14k.  Patient also has AKI with BUN 121, creat 2.0.  Was 56 and 1.3 back in June.   Review of Systems: As per HPI, otherwise all review of systems negative.  Past Medical History:  Diagnosis Date  . Diabetes mellitus without complication ([place])   . Hypertension   . Stroke [facility])     Past Surgical History:  Procedure Laterality Date  . CATARACT EXTRACTION  07/17/2019     reports that she has quit smoking. She has never used smokeless tobacco. She reports that she does not drink alcohol or use drugs.  No Known Allergies  No family history on file.  No sick contacts   Prior to Admission medications   Medication Sig Start Date End Date Taking? Authorizing Provider  clopidogrel (PLAVIX) 75 MG tablet Take 75 mg by mouth daily.    [provider]  clotrimazole (LOTRIMIN) 1 % cream Apply topically 2 (two) times daily. 06/22/19   Alma Friendly, MD  doxazosin (CARDURA) 4 MG tablet Take 4 mg by mouth at bedtime. 05/17/19   [provider]  DUREZOL 0.05 % EMUL Place 1 drop into the right eye as directed. Instill 1 drop BID IN OPERATIVE EYE Starting 2 Days Prior to Surgery and After Surgery for 3 Weeks 06/04/19   [provider]  furosemide (LASIX) 40  MG tablet Take 0.5 tablets (20 mg total) by mouth daily as needed for fluid or edema (for weight gain of more then 2 Lbs in 1 day). Patient taking differently: Take 80 mg by mouth daily.  05/15/16   Lavina Hamman, MD  Insulin Glargine (BASAGLAR KWIKPEN) 100 UNIT/ML SOPN Inject 15 Units into the skin 2 (two) times daily.  07/09/18   [provider]  KLOR-CON M20 20 MEQ tablet Take 20 mEq by mouth daily. 05/24/18   [provider]  labetalol (NORMODYNE) 100 MG tablet Take 100 mg by mouth 2 (two) times daily. 06/06/18   [provider]  ofloxacin (OCUFLOX) 0.3 % ophthalmic solution Place 1 drop into the right eye as directed. Instill 1 drop BID IN OPERATIVE EYE Starting 2 Days Prior to Surgery and After Surgery for 3 Weeks 06/04/19   [provider]  simvastatin (ZOCOR) 20 MG tablet Take 20 mg by mouth daily with breakfast.    [provider]    Physical Exam: Vitals:   09/13/19 0130 09/13/19 0200 09/13/19 0230 09/13/19 0300  BP: 132/72 124/64 116/76 114/62  Pulse: 81 79 82 84  Resp: (!) 23 (!) 23 18 (!) 23  Temp:      TempSrc:      SpO2: 97% 96% 98% 96%    Constitutional: NAD, calm, comfortable Eyes: PERRL, lids and conjunctivae normal ENMT: Mucous membranes are moist. Posterior pharynx clear of  any exudate or lesions.Normal dentition.  Neck: normal, supple, no masses, no thyromegaly Respiratory: clear to auscultation bilaterally, no wheezing, no crackles. Normal respiratory effort. No accessory muscle use.  Cardiovascular: Regular rate and rhythm, no murmurs / rubs / gallops. No extremity edema. 2+ pedal pulses. No carotid bruits.  Abdomen: Mild diffuse TTP. Musculoskeletal: no clubbing / cyanosis. No joint deformity upper and lower extremities. Good ROM, no contractures. Normal muscle tone.  Skin: no rashes, lesions, ulcers. No induration Neurologic: CN 2-12 grossly intact. Sensation intact, DTR normal. Strength 5/5 in all 4.  Psychiatric: Normal  judgment and insight. Alert and oriented x 3. Normal mood.    Labs on Admission: I have personally reviewed following labs and imaging studies  CBC: Recent Labs  Lab 09/13/19 0108  WBC 14.1*  NEUTROABS 12.6*  HGB 12.9  HCT 40.1  MCV 96.2  PLT 123456   Basic Metabolic Panel: Recent Labs  Lab 09/13/19 0108  NA 138  K 5.3*  CL 103  CO2 19*  GLUCOSE 218*  BUN 121*  CREATININE 2.00*  CALCIUM 9.2   GFR: CrCl cannot be calculated (Unknown ideal weight.). Liver Function Tests: Recent Labs  Lab 09/13/19 0108  AST 14*  ALT 12  ALKPHOS 103  BILITOT 0.4  PROT 7.4  ALBUMIN 3.7   Recent Labs  Lab 09/13/19 0108  LIPASE 46   No results for input(s): AMMONIA in the last 168 hours. Coagulation Profile: No results for input(s): INR, PROTIME in the last 168 hours. Cardiac Enzymes: No results for input(s): CKTOTAL, CKMB, CKMBINDEX, TROPONINI in the last 168 hours. BNP (last 3 results) No results for input(s): PROBNP in the last 8760 hours. HbA1C: No results for input(s): HGBA1C in the last 72 hours. CBG: No results for input(s): GLUCAP in the last 168 hours. Lipid Profile: No results for input(s): CHOL, HDL, LDLCALC, TRIG, CHOLHDL, LDLDIRECT in the last 72 hours. Thyroid Function Tests: No results for input(s): TSH, T4TOTAL, FREET4, T3FREE, THYROIDAB in the last 72 hours. Anemia Panel: No results for input(s): VITAMINB12, FOLATE, FERRITIN, TIBC, IRON, RETICCTPCT in the last 72 hours. Urine analysis:    Component Value Date/Time   COLORURINE YELLOW 06/16/2019 1357   APPEARANCEUR CLEAR 06/16/2019 1357   LABSPEC 1.010 06/16/2019 1357   PHURINE 5.0 06/16/2019 1357   GLUCOSEU 50 (A) 06/16/2019 1357   HGBUR NEGATIVE 06/16/2019 1357   [place] 06/16/2019 1357   KETONESUR NEGATIVE 06/16/2019 1357   PROTEINUR NEGATIVE 06/16/2019 1357   UROBILINOGEN 1.0 12/05/2007 1244   NITRITE NEGATIVE 06/16/2019 1357   LEUKOCYTESUR MODERATE (A) 06/16/2019 1357     Radiological Exams on Admission: Ct Abdomen Pelvis Wo Contrast  Result Date: 09/13/2019 CLINICAL DATA:  Generalized abdominal pain EXAM: CT ABDOMEN AND PELVIS WITHOUT CONTRAST TECHNIQUE: Multidetector CT imaging of the abdomen and pelvis was performed following the standard protocol without IV contrast. COMPARISON:  None. FINDINGS: Lower chest: Lung bases are free of acute infiltrate or sizable effusion. Hepatobiliary: No focal liver abnormality is seen. No gallstones, gallbladder wall thickening, or biliary dilatation. Pancreas: Unremarkable. No pancreatic ductal dilatation or surrounding inflammatory changes. Spleen: Normal in size without focal abnormality. Adrenals/Urinary Tract: Adrenal glands demonstrates some mild hyperplasia. Small nonobstructing renal calculi are noted bilaterally. The ureters are within normal limits. The bladder is decompressed. Stomach/Bowel: Changes of fecal impaction in the rectum are noted. Some mild wall thickening is noted which may represent some stercoral colitis. No obstructive changes are noted proximally. The appendix is within normal limits. No small  bowel or gastric abnormality is seen. Vascular/Lymphatic: Aortic atherosclerosis. No enlarged abdominal or pelvic lymph nodes. Reproductive: Status post hysterectomy. No adnexal masses. Other: No free fluid is noted. Musculoskeletal: Degenerative changes of lumbar spine are seen. IMPRESSION: Rectal impaction with mild changes of stercoral colitis. Tiny nonobstructing renal calculi bilaterally. No other acute abnormality is seen. Electronically Signed   By: Inez Catalina M.D.   On: 09/13/2019 03:44    EKG: Independently reviewed.  Assessment/Plan Principal Problem:   AKI (acute kidney injury) ([place]) Active Problems:   DM (diabetes mellitus), type 2 with peripheral vascular complications ([facility])   Constipation   Colitis   HTN (hypertension)    1. AKI - 1. Suspect pre-renal / dehydration - 2. IVF: 1L bolus and NS at  125 cc/hr 3. Hold home Lasix 4. Strict intake and output 5. No hydronephrosis and bladder appears decompressed on CT scan 6. Repeat BMP at 1200 and tomorrow AM at 0500 2. Constipation and stercoral colitis - 1. Rocephin / flagyl 2. MOM enema 3. Hyperkalemia - Mild with K of 5.3 1. Got lokelma in ED by EDP 2. Repeat BMP at 1200 3. Tele monitor 4. Holding PO potassium 4. DM2 - 1. Sensitive SSI AC for now given the AKI 2. Home med rec pending to determine her lantus dosing, but likely to hold / reduce dose for the moment given AKI 5. HTN - 1. BP 110s at the moment and med rec pending anyhow, will hold off on ordering any home BP meds for the moment 2. Resume labetalol when BP elevates 6. Prior stroke - 1. Continue anti-platelet agent when med rec completed 2. PT/OT eval and treat due to debility.  DVT prophylaxis: Heparin [place] Code Status: Full Family Communication: No family in room Disposition Plan: TBD Consults called: None Admission status: Admit to inpatient  Severity of Illness: The appropriate patient status for this patient is INPATIENT. Inpatient status is judged to be reasonable and necessary in order to provide the required intensity of service to ensure the patient's safety. The patient's presenting symptoms, physical exam findings, and initial radiographic and laboratory data in the context of their chronic comorbidities is felt to place them at high risk for further clinical deterioration. Furthermore, it is not anticipated that the patient will be medically stable for discharge from the hospital within 2 midnights of admission. The following factors support the patient status of inpatient.    " The initial radiographic and laboratory data are worrisome because of stool impaction with stercoral colitis, AKF with BUN 121. " The chronic co-morbidities include debility, wheelchair bound.   * I certify that at the point of admission it is my clinical judgment that the  patient will require inpatient hospital care spanning beyond 2 midnights from the point of admission due to high intensity of service, high risk for further deterioration and high frequency of surveillance required.*    Ajahnae Rathgeber M. DO [facility]  How to contact the [facility] Attending or Consulting provider [place] or covering provider during after hours [place], for this patient?  1. Check the care team in [facility] and look for a) attending/consulting TR[facility] provider listed and b) the [facility] team listed 2. Log into [email or web address]  Amion Physician Scheduling and messaging for groups and whole hospitals  On call and physician scheduling software for group practices, residents, hospitalists and other medical providers for call, clinic, rotation and shift schedules. OnCall [employer] is a hospital-wide system for scheduling doctors and paging doctors  on call. EasyPlot is for scientific plotting and data analysis.  [email or web address]  and use [place]'s universal password to access. If you do not have the password, please contact the hospital operator.  3. Locate the [facility] provider you are looking for under [facility] and page to a number that you can be directly reached. 4. If you still have difficulty reaching the provider, please page the [facility] (Director on Call) for the Hospitalists listed on amion for assistance.  09/13/2019, 5:24 AM

## 2019-09-13 NOTE — Progress Notes (Signed)
OT Cancellation Note  Patient Details Name: Angela Hays MRN: [id number] DOB: 08/26/1951   Cancelled Treatment:    Reason Eval/Treat Not Completed: OT screened, no needs identified, will sign off.  Noted, pt needs total care at baseline. Will sign off.  Chiara Coltrin 09/13/2019, 1:06 PM  Lesle Chris, OTR/L Acute Rehabilitation Services [phone] [facility] pager [phone] office 09/13/2019

## 2019-09-13 NOTE — ED Triage Notes (Signed)
Per EMS - Pt coming from home, report from caretaker (son), "pt has been eating nothing but big macs". Pt is complaining of right sided flank pain and minimal bowel movements. (Pt has had BM since arriving at hospital). Son reports pt sits in chair all day due to being wheelchair bound because of hx of stroke. EMS states left leg is slightly swollen, but pt has hx if CHF.   128 70 80 HR 18 R 97% RA  CBG 208  97.9

## 2019-09-13 NOTE — ED Notes (Signed)
Pt cleaned up from bowel movement she had during ambulance transport.

## 2019-09-14 DIAGNOSIS — R109 Unspecified abdominal pain: Secondary | ICD-10-CM | POA: Diagnosis present

## 2019-09-14 DIAGNOSIS — N179 Acute kidney failure, unspecified: Secondary | ICD-10-CM | POA: Diagnosis not present

## 2019-09-14 DIAGNOSIS — E875 Hyperkalemia: Secondary | ICD-10-CM | POA: Diagnosis not present

## 2019-09-14 DIAGNOSIS — K529 Noninfective gastroenteritis and colitis, unspecified: Secondary | ICD-10-CM | POA: Diagnosis not present

## 2019-09-14 DIAGNOSIS — E1151 Type 2 diabetes mellitus with diabetic peripheral angiopathy without gangrene: Secondary | ICD-10-CM | POA: Diagnosis not present

## 2019-09-14 LAB — BASIC METABOLIC PANEL
Anion gap: 13 (ref 5–15)
BUN: 77 mg/dL — ABNORMAL HIGH (ref 8–23)
CO2: 19 mmol/L — ABNORMAL LOW (ref 22–32)
Calcium: 8.6 mg/dL — ABNORMAL LOW (ref 8.9–10.3)
Chloride: 111 mmol/L (ref 98–111)
Creatinine, Ser: 1.35 mg/dL — ABNORMAL HIGH (ref 0.44–1.00)
GFR calc Af Amer: 46 mL/min — ABNORMAL LOW (ref >60)
GFR calc non Af Amer: 40 mL/min — ABNORMAL LOW (ref >60)
Glucose, Bld: 73 mg/dL (ref 70–99)
Potassium: 3.4 mmol/L — ABNORMAL LOW (ref 3.5–5.1)
Sodium: 143 mmol/L (ref 135–145)

## 2019-09-14 LAB — CBC
HCT: 35.9 % — ABNORMAL LOW (ref 36.0–46.0)
Hemoglobin: 11.4 g/dL — ABNORMAL LOW (ref 12.0–15.0)
MCH: 30.2 pg (ref 26.0–34.0)
MCHC: 31.8 g/dL (ref 30.0–36.0)
MCV: 95.2 fL (ref 80.0–100.0)
Platelets: 197 10*3/uL (ref 150–400)
RBC: 3.77 MIL/uL — ABNORMAL LOW (ref 3.87–5.11)
RDW: 13.5 % (ref 11.5–15.5)
WBC: 7.8 10*3/uL (ref 4.0–10.5)
nRBC: 0 % (ref 0.0–0.2)

## 2019-09-14 LAB — GLUCOSE, CAPILLARY
Glucose-Capillary: 95 mg/dL (ref 70–99)
Glucose-Capillary: 141 mg/dL — ABNORMAL HIGH (ref 70–99)

## 2019-09-14 MED ORDER — POTASSIUM CHLORIDE CRYS ER 20 MEQ PO TBCR
40.0000 meq | EXTENDED_RELEASE_TABLET | Freq: Once | ORAL | Status: AC
  Administered 2019-09-14: 40 meq via ORAL
  Filled 2019-09-14: qty 2

## 2019-09-14 MED ORDER — ACETAMINOPHEN 325 MG PO TABS: 650.0000 mg | ORAL_TABLET | Freq: Four times a day (QID) | ORAL | 0 refills | Status: AC | PRN

## 2019-09-14 MED ORDER — METRONIDAZOLE 500 MG PO TABS: 500.0000 mg | ORAL_TABLET | Freq: Three times a day (TID) | ORAL | 0 refills | Status: AC

## 2019-09-14 MED ORDER — CIPROFLOXACIN HCL 500 MG PO TABS: 500.0000 mg | ORAL_TABLET | Freq: Two times a day (BID) | ORAL | 0 refills | Status: AC

## 2019-09-14 MED ORDER — POLYETHYLENE GLYCOL 3350 17 G PO PACK: 17.0000 g | PACK | Freq: Every day | ORAL | 0 refills | Status: DC

## 2019-09-14 NOTE — Care Management Obs Status (Signed)
[place] NOTIFICATION   Patient Details  Name: Angela Hays MRN: [id number] Date of Birth: Sep 07, 1951   Medicare Observation Status Notification Given:  Yes    Joaquin Courts, RN 09/14/2019, 12:59 PM

## 2019-09-14 NOTE — Discharge Instructions (Signed)
Constipation, Adult Constipation is when a person:  Poops (has a bowel movement) fewer times in a week than normal.  Has a hard time pooping.  Has poop that is dry, hard, or bigger than normal. Follow these instructions at home: Eating and drinking   Eat foods that have a lot of fiber, such as: ? Fresh fruits and vegetables. ? Whole grains. ? Beans.  Eat less of foods that are high in fat, low in fiber, or overly processed, such as: ? [place] fries. ? Hamburgers. ? Cookies. ? Candy. ? Soda.  Drink enough fluid to keep your pee (urine) clear or pale yellow. General instructions  Exercise regularly or as told by your doctor.  Go to the restroom when you feel like you need to poop. Do not hold it in.  Take over-the-counter and prescription medicines only as told by your doctor. These include any fiber supplements.  Do pelvic floor retraining exercises, such as: ? Doing deep breathing while relaxing your lower belly (abdomen). ? Relaxing your pelvic floor while pooping.  Watch your condition for any changes.  Keep all follow-up visits as told by your doctor. This is important. Contact a doctor if:  You have pain that gets worse.  You have a fever.  You have not pooped for 4 days.  You throw up (vomit).  You are not hungry.  You lose weight.  You are bleeding from the anus.  You have thin, pencil-like poop (stool). Get help right away if:  You have a fever, and your symptoms suddenly get worse.  You leak poop or have blood in your poop.  Your belly feels hard or bigger than normal (is bloated).  You have very bad belly pain.  You feel dizzy or you faint. This information is not intended to replace advice given to you by your health care provider. Make sure you discuss any questions you have with your health care provider. Document Released: 05/30/2008 Document Revised: 11/24/2017 Document Reviewed: 06/01/2016 Elsevier Patient Education  2020  [place].   Colitis  Colitis is inflammation of the colon. Colitis may last a short time (be acute), or it may last a long time (become chronic). What are the causes? This condition may be caused by:  Viruses.  Bacteria.  Reaction to medicine.  Certain autoimmune diseases such as Crohn's disease or ulcerative colitis.  Radiation treatment.  Decreased blood flow to the bowel (ischemia). What are the signs or symptoms? Symptoms of this condition include:  Watery diarrhea.  Passing bloody or tarry stool.  Pain.  Fever.  Vomiting.  Tiredness (fatigue).  Weight loss.  Bloating.  Abdominal pain.  Having fewer bowel movements than usual.  A strong and sudden urge to have a bowel movement.  Feeling like the bowel is not empty after a bowel movement. How is this diagnosed? This condition is diagnosed with a stool test or a blood test. You may also have other tests, such as:  X-rays.  CT scan.  Colonoscopy.  Endoscopy.  Biopsy. How is this treated? Treatment for this condition depends on the cause. The condition may be treated by:  Resting the bowel. This involves not eating or drinking for a period of time.  Fluids that are given through an IV.  Medicine for pain and diarrhea.  Antibiotic medicines.  Cortisone medicines.  Surgery. Follow these instructions at home: Eating and drinking   Follow instructions from your health care provider about eating or drinking restrictions.  Drink enough fluid  to keep your urine pale yellow.  Work with a dietitian to determine which foods cause your condition to flare up.  Avoid foods that cause flare-ups.  Eat a well-balanced diet. General instructions  If you were prescribed an antibiotic medicine, take it as told by your health care provider. Do not stop taking the antibiotic even if you start to feel better.  Take over-the-counter and prescription medicines only as told by your health care  provider.  Keep all follow-up visits as told by your health care provider. This is important. Contact a health care provider if:  Your symptoms do not go away.  You develop new symptoms. Get help right away if you:  Have a fever that does not go away with treatment.  Develop chills.  Have extreme weakness, fainting, or dehydration.  Have repeated vomiting.  Develop severe pain in your abdomen.  Pass bloody or tarry stool. Summary  Colitis is inflammation of the colon. Colitis may last a short time (be acute), or it may last a long time (become chronic).  Treatment for this condition depends on the cause and may include resting the bowel, taking medicines, or having surgery.  If you were prescribed an antibiotic medicine, take it as told by your health care provider. Do not stop taking the antibiotic even if you start to feel better.  Get help right away if you develop severe pain in your abdomen.  Keep all follow-up visits as told by your health care provider. This is important. This information is not intended to replace advice given to you by your health care provider. Make sure you discuss any questions you have with your health care provider. Document Released: 01/19/2005 Document Revised: 06/14/2018 Document Reviewed: 06/14/2018 Elsevier Patient Education  2020 [employer].

## 2019-09-14 NOTE — Discharge Summary (Signed)
Physician Discharge Summary  Angela Hays [id number] DOB: [birth year] DOA: 09/13/2019  PCP: Maury Dus, MD  Admit date: 09/13/2019 Discharge date: 09/14/2019  Admitted From: Home Disposition: Home  Recommendations for Outpatient Follow-up:  1. Follow up with PCP in 1 week. 2. Patient will be discharged on oral Cipro and Flagyl to complete 7-day course of antibiotic.  Home Health: None Equipment/Devices: None   Discharge Condition: Fair CODE STATUS: Full code Diet recommendation: Heart Healthy / Carb Modified     Discharge Diagnoses:  Principal Problem:   Colitis  Active Problems:   AKI (acute kidney injury) ([place])   DM (diabetes mellitus), type 2 with peripheral vascular complications (HCC)   Constipation   HTN (hypertension)  Brief narrative/HPI 68 year old female with history of stroke and left-sided weakness, wheelchair-bound, type 2 diabetes mellitus and hypertension presented to the ED with right lower quadrant abdominal pain radiating to the back and constipation. In the ED she had leukocytosis (14 K), AKI with BUN of 121 and creatinine of 2 (was 38 and 1.3 few months back).  CT abdomen and pelvis showed fecal impaction with stercoral colitis. Admitted for IV antibiotics, IV fluids and enema.  Hospital course Principal Problem: Acute stercoral colitis with constipation Abdominal pain symptoms resolved after patient had 3 bowel movements without need for enema.   Placed on empiric IV Rocephin and Flagyl.  Leukocytosis resolved and patient can be discharged on oral Cipro and Flagyl to complete total 7-day course of antibiotic.  Added daily MiraLAX for constipation.  Patient encouraged to keep herself hydrated and add fibers in her diet.  Reading material provided to the patient.Marland Kitchen    AKI (acute kidney injury) ([place]) Suspect prerenal with dehydration.    Improved with IV fluids.  Lasix and Cardura were held and can be resumed upon discharge.  On reviewing her home  meds I noticed that she was taking PRN ibuprofen.  I have informed patient and her son to avoid any form of NSAIDs.  May use Tylenol as needed for pain.  Active Problems:     DM (diabetes mellitus), type 2 with peripheral vascular complications, uncontrolled with hyperglycemia (HCC) A1c of 10.7, 3 months back.  On Lantus 15 units twice daily.  Continue for now and needs tighter blood glucose control during outpatient follow-up.  Essential hypertension Continue labetalol and amlodipine.  Resume Lasix and Cardura.  History of stroke Continue statin and Plavix    Family Communication  :  Son updated on the phone  Disposition Plan  : Home   Consults  : None  Procedures  : CT abdomen pelvis  Discharge Instructions   Allergies as of 09/14/2019   No Known Allergies     Medication List    STOP taking these medications   ibuprofen 200 MG tablet Commonly known as: ADVIL     TAKE these medications   acetaminophen 325 MG tablet Commonly known as: Tylenol Take 2 tablets (650 mg total) by mouth every 6 (six) hours as needed for up to 5 days for moderate pain.   amLODipine 5 MG tablet Commonly known as: NORVASC Take 5 mg by mouth daily.   Basaglar KwikPen 100 UNIT/ML Sopn Inject 15 Units into the skin 2 (two) times daily.   ciprofloxacin 500 MG tablet Commonly known as: Cipro Take 1 tablet (500 mg total) by mouth 2 (two) times daily for 6 days.   clopidogrel 75 MG tablet Commonly known as: PLAVIX Take 75 mg by mouth daily.   clotrimazole 1 %  cream Commonly known as: LOTRIMIN Apply topically 2 (two) times daily. What changed:   how much to take  when to take this  reasons to take this   doxazosin 4 MG tablet Commonly known as: CARDURA Take 4 mg by mouth at bedtime.   furosemide 40 MG tablet Commonly known as: LASIX Take 0.5 tablets (20 mg total) by mouth daily as needed for fluid or edema (for weight gain of more then 2 Lbs in 1 day). What changed:    how much to take  when to take this   Klor-Con M20 20 MEQ tablet Generic drug: potassium chloride SA Take 20 mEq by mouth daily.   labetalol 100 MG tablet Commonly known as: NORMODYNE Take 100 mg by mouth 2 (two) times daily.   metroNIDAZOLE 500 MG tablet Commonly known as: Flagyl Take 1 tablet (500 mg total) by mouth 3 (three) times daily for 6 days.   polyethylene glycol 17 g packet Commonly known as: MIRALAX / GLYCOLAX Take 17 g by mouth daily.   PRESCRIPTION MEDICATION Take 1 tablet by mouth once as needed (butt sores). Pt has old antibiotic per son that she took on 09/12/2019 for sores on buttocks.   simvastatin 20 MG tablet Commonly known as: ZOCOR Take 20 mg by mouth daily with breakfast.      Follow-up Information    Maury Dus, MD. Schedule an appointment as soon as possible for a visit in 1 week(s).   Specialty: Family Medicine Contact information: [place] [address] [phone]          No Known Allergies      Procedures/Studies: Ct Abdomen Pelvis Wo Contrast  Result Date: 09/13/2019 CLINICAL DATA:  Generalized abdominal pain EXAM: CT ABDOMEN AND PELVIS WITHOUT CONTRAST TECHNIQUE: Multidetector CT imaging of the abdomen and pelvis was performed following the standard protocol without IV contrast. COMPARISON:  None. FINDINGS: Lower chest: Lung bases are free of acute infiltrate or sizable effusion. Hepatobiliary: No focal liver abnormality is seen. No gallstones, gallbladder wall thickening, or biliary dilatation. Pancreas: Unremarkable. No pancreatic ductal dilatation or surrounding inflammatory changes. Spleen: Normal in size without focal abnormality. Adrenals/Urinary Tract: Adrenal glands demonstrates some mild hyperplasia. Small nonobstructing renal calculi are noted bilaterally. The ureters are within normal limits. The bladder is decompressed. Stomach/Bowel: Changes of fecal impaction in the rectum are noted.  Some mild wall thickening is noted which may represent some stercoral colitis. No obstructive changes are noted proximally. The appendix is within normal limits. No small bowel or gastric abnormality is seen. Vascular/Lymphatic: Aortic atherosclerosis. No enlarged abdominal or pelvic lymph nodes. Reproductive: Status post hysterectomy. No adnexal masses. Other: No free fluid is noted. Musculoskeletal: Degenerative changes of lumbar spine are seen. IMPRESSION: Rectal impaction with mild changes of stercoral colitis. Tiny nonobstructing renal calculi bilaterally. No other acute abnormality is seen. Electronically Signed   By: Inez Catalina M.D.   On: 09/13/2019 03:44    (Echo, Carotid, EGD, Colonoscopy, ERCP)    Subjective:   Discharge Exam: Vitals:   09/13/19 2121 09/14/19 0418  BP: 138/67 (!) 139/59  Pulse: 86 82  Resp: 18 16  Temp: 98.1 F (36.7 C) 98.1 F (36.7 C)  SpO2: 100% 100%   Vitals:   09/13/19 1027 09/13/19 1400 09/13/19 2121 09/14/19 0418  BP: (!) 113/54 (!) 126/57 138/67 (!) 139/59  Pulse: 80 77 86 82  Resp:   18 16  Temp:  98.4 F (36.9 C) 98.1 F (36.7  C) 98.1 F (36.7 C)  TempSrc:   Oral Oral  SpO2:   100% 100%  Weight:      Height:        General: Elderly female not in distress HEENT: Moist mucosa, supple neck Chest: Clear bilaterally CVs: Normal S1-S2 GI: Soft, nondistended, nontender, bowel sounds present Musculoskeletal: Warm, no edema    The results of significant diagnostics from this hospitalization (including imaging, microbiology, ancillary and laboratory) are listed below for reference.     Microbiology: Recent Results (from the past 240 hour(s))  SARS CORONAVIRUS 2 (TAT 6-24 HRS) Nasopharyngeal Nasopharyngeal Swab     Status: None   Collection Time: 09/13/19  3:52 AM   Specimen: Nasopharyngeal Swab  Result Value Ref Range Status   SARS Coronavirus 2 NEGATIVE NEGATIVE Final    Comment: (NOTE) SARS-CoV-2 target nucleic acids are NOT  DETECTED. The SARS-CoV-2 RNA is generally detectable in upper and lower respiratory specimens during the acute phase of infection. Negative results do not preclude SARS-CoV-2 infection, do not rule out co-infections with other pathogens, and should not be used as the sole basis for treatment or other patient management decisions. Negative results must be combined with clinical observations, patient history, and epidemiological information. The expected result is Negative. Fact Sheet for Patients: SugarRoll.be Fact Sheet for Healthcare Providers: https://www.woods-mathews.com/ This test is not yet approved or cleared by the [place] FDA and  has been authorized for detection and/or diagnosis of SARS-CoV-2 by FDA under an Emergency Use Authorization (EUA). This EUA will remain  in effect (meaning this test can be used) for the duration of the COVID-19 declaration under Section 56 4(b)(1) of the Act, 21 U.S.C. section 360bbb-3(b)(1), unless the authorization is terminated or revoked sooner. Performed at [facility], [place] [address], [place], [place] [address]   MRSA PCR Screening     Status: None   Collection Time: 09/13/19  8:08 AM   Specimen: Nasal Mucosa; Nasopharyngeal  Result Value Ref Range Status   MRSA by PCR NEGATIVE NEGATIVE Final    Comment:        The GeneXpert MRSA Assay (FDA approved for NASAL specimens only), is one component of a comprehensive MRSA colonization surveillance program. It is not intended to diagnose MRSA infection nor to guide or monitor treatment for MRSA infections. Performed at [facility], [place] [address], [place], [place] [address]      Labs: BNP (last 3 results) Recent Labs    06/16/19 1458  BNP 0000000*   Basic Metabolic Panel: Recent Labs  Lab 09/13/19 0108 09/13/19 1212 09/14/19 0402  NA 138 141 143  K 5.3* 3.8 3.4*  CL 103 109 111  CO2 19* 23 19*   GLUCOSE 218* 129* 73  BUN 121* 97* 77*  CREATININE 2.00* 1.72* 1.35*  CALCIUM 9.2 8.5* 8.6*   Liver Function Tests: Recent Labs  Lab 09/13/19 0108  AST 14*  ALT 12  ALKPHOS 103  BILITOT 0.4  PROT 7.4  ALBUMIN 3.7   Recent Labs  Lab 09/13/19 0108  LIPASE 46   No results for input(s): AMMONIA in the last 168 hours. CBC: Recent Labs  Lab 09/13/19 0108 09/14/19 0402  WBC 14.1* 7.8  NEUTROABS 12.6*  --   HGB 12.9 11.4*  HCT 40.1 35.9*  MCV 96.2 95.2  PLT 211 197   Cardiac Enzymes: No results for input(s): CKTOTAL, CKMB, CKMBINDEX, TROPONINI in the last 168 hours. BNP: Invalid input(s): POCBNP CBG: Recent Labs  Lab 09/13/19 0736 09/13/19 1203 09/13/19 1630 09/13/19 2118  GLUCAP 151* 117* 117* 127*   D-Dimer No results for input(s): DDIMER in the last 72 hours. Hgb A1c No results for input(s): HGBA1C in the last 72 hours. Lipid Profile No results for input(s): CHOL, HDL, LDLCALC, TRIG, CHOLHDL, LDLDIRECT in the last 72 hours. Thyroid function studies No results for input(s): TSH, T4TOTAL, T3FREE, THYROIDAB in the last 72 hours.  Invalid input(s): FREET3 Anemia work up No results for input(s): VITAMINB12, FOLATE, FERRITIN, TIBC, IRON, RETICCTPCT in the last 72 hours. Urinalysis    Component Value Date/Time   COLORURINE STRAW (A) 09/13/2019 1008   APPEARANCEUR CLEAR 09/13/2019 1008   LABSPEC 1.009 09/13/2019 1008   PHURINE 5.0 09/13/2019 1008   GLUCOSEU NEGATIVE 09/13/2019 1008   HGBUR NEGATIVE 09/13/2019 1008   BILIRUBINUR NEGATIVE 09/13/2019 1008   KETONESUR NEGATIVE 09/13/2019 1008   PROTEINUR NEGATIVE 09/13/2019 1008   UROBILINOGEN 1.0 12/05/2007 1244   NITRITE NEGATIVE 09/13/2019 1008   LEUKOCYTESUR NEGATIVE 09/13/2019 1008   Sepsis Labs Invalid input(s): PROCALCITONIN,  WBC,  LACTICIDVEN Microbiology Recent Results (from the past 240 hour(s))  SARS CORONAVIRUS 2 (TAT 6-24 HRS) Nasopharyngeal Nasopharyngeal Swab     Status: None    Collection Time: 09/13/19  3:52 AM   Specimen: Nasopharyngeal Swab  Result Value Ref Range Status   SARS Coronavirus 2 NEGATIVE NEGATIVE Final    Comment: (NOTE) SARS-CoV-2 target nucleic acids are NOT DETECTED. The SARS-CoV-2 RNA is generally detectable in upper and lower respiratory specimens during the acute phase of infection. Negative results do not preclude SARS-CoV-2 infection, do not rule out co-infections with other pathogens, and should not be used as the sole basis for treatment or other patient management decisions. Negative results must be combined with clinical observations, patient history, and epidemiological information. The expected result is Negative. Fact Sheet for Patients: SugarRoll.be Fact Sheet for Healthcare Providers: https://www.woods-mathews.com/ This test is not yet approved or cleared by the [place] FDA and  has been authorized for detection and/or diagnosis of SARS-CoV-2 by FDA under an Emergency Use Authorization (EUA). This EUA will remain  in effect (meaning this test can be used) for the duration of the COVID-19 declaration under Section 56 4(b)(1) of the Act, 21 U.S.C. section 360bbb-3(b)(1), unless the authorization is terminated or revoked sooner. Performed at [facility], [place] [address] [place], [place] [address]   MRSA PCR Screening     Status: None   Collection Time: 09/13/19  8:08 AM   Specimen: Nasal Mucosa; Nasopharyngeal  Result Value Ref Range Status   MRSA by PCR NEGATIVE NEGATIVE Final    Comment:        The GeneXpert MRSA Assay (FDA approved for NASAL specimens only), is one component of a comprehensive MRSA colonization surveillance program. It is not intended to diagnose MRSA infection nor to guide or monitor treatment for MRSA infections. Performed at [facility], [place] [address] [place], [place] [address]      Time coordinating discharge: 35  minutes  SIGNED:   Louellen Molder, MD  [facility] 09/14/2019, 7:26 AM Pager   If 7PM-7AM, please contact night-coverage [email or web address] Password [email or web address]

## 2019-09-14 NOTE — Care Management CC44 (Signed)
Condition Code 44 Documentation Completed  Patient Details  Name: Angela Hays MRN: [id number] Date of Birth: May 08, 1951   Condition Code 44 given:  Yes Patient signature on Condition Code 44 notice:  Yes Documentation of 2 MD's agreement:  Yes Code 44 added to claim:  Yes    Joaquin Courts, RN 09/14/2019, 12:59 PM

## 2019-11-18 ENCOUNTER — Inpatient Hospital Stay (HOSPITAL_COMMUNITY)
Admission: EM | Admit: 2019-11-18 | Discharge: 2019-11-21 | DRG: 641 | Disposition: A | Discharge status: Home / Self Care | Payer: Medicare Other | Attending: Internal Medicine | Admitting: Internal Medicine

## 2019-11-18 ENCOUNTER — Other Ambulatory Visit: Payer: Self-pay

## 2019-11-18 ENCOUNTER — Encounter (HOSPITAL_COMMUNITY): Payer: Self-pay | Admitting: Emergency Medicine

## 2019-11-18 DIAGNOSIS — Z87891 Personal history of nicotine dependence: Secondary | ICD-10-CM

## 2019-11-18 DIAGNOSIS — N179 Acute kidney failure, unspecified: Secondary | ICD-10-CM | POA: Diagnosis present

## 2019-11-18 DIAGNOSIS — L89312 Pressure ulcer of right buttock, stage 2: Secondary | ICD-10-CM | POA: Diagnosis present

## 2019-11-18 DIAGNOSIS — Z79899 Other long term (current) drug therapy: Secondary | ICD-10-CM

## 2019-11-18 DIAGNOSIS — L89622 Pressure ulcer of left heel, stage 2: Secondary | ICD-10-CM | POA: Diagnosis present

## 2019-11-18 DIAGNOSIS — E1122 Type 2 diabetes mellitus with diabetic chronic kidney disease: Secondary | ICD-10-CM | POA: Diagnosis present

## 2019-11-18 DIAGNOSIS — L89322 Pressure ulcer of left buttock, stage 2: Secondary | ICD-10-CM | POA: Diagnosis present

## 2019-11-18 DIAGNOSIS — N183 Chronic kidney disease, stage 3 unspecified: Secondary | ICD-10-CM | POA: Diagnosis present

## 2019-11-18 DIAGNOSIS — I959 Hypotension, unspecified: Secondary | ICD-10-CM | POA: Diagnosis present

## 2019-11-18 DIAGNOSIS — E875 Hyperkalemia: Secondary | ICD-10-CM | POA: Diagnosis not present

## 2019-11-18 DIAGNOSIS — Z20828 Contact with and (suspected) exposure to other viral communicable diseases: Secondary | ICD-10-CM | POA: Diagnosis present

## 2019-11-18 DIAGNOSIS — E11649 Type 2 diabetes mellitus with hypoglycemia without coma: Secondary | ICD-10-CM | POA: Diagnosis present

## 2019-11-18 DIAGNOSIS — E861 Hypovolemia: Secondary | ICD-10-CM | POA: Diagnosis present

## 2019-11-18 DIAGNOSIS — E162 Hypoglycemia, unspecified: Secondary | ICD-10-CM

## 2019-11-18 DIAGNOSIS — E1151 Type 2 diabetes mellitus with diabetic peripheral angiopathy without gangrene: Secondary | ICD-10-CM | POA: Diagnosis present

## 2019-11-18 DIAGNOSIS — E160 Drug-induced hypoglycemia without coma: Secondary | ICD-10-CM

## 2019-11-18 DIAGNOSIS — Z993 Dependence on wheelchair: Secondary | ICD-10-CM

## 2019-11-18 DIAGNOSIS — I129 Hypertensive chronic kidney disease with stage 1 through stage 4 chronic kidney disease, or unspecified chronic kidney disease: Secondary | ICD-10-CM | POA: Diagnosis present

## 2019-11-18 DIAGNOSIS — T383X5A Adverse effect of insulin and oral hypoglycemic [antidiabetic] drugs, initial encounter: Secondary | ICD-10-CM | POA: Diagnosis present

## 2019-11-18 DIAGNOSIS — I10 Essential (primary) hypertension: Secondary | ICD-10-CM | POA: Diagnosis present

## 2019-11-18 DIAGNOSIS — Z7902 Long term (current) use of antithrombotics/antiplatelets: Secondary | ICD-10-CM

## 2019-11-18 DIAGNOSIS — I69354 Hemiplegia and hemiparesis following cerebral infarction affecting left non-dominant side: Secondary | ICD-10-CM

## 2019-11-18 DIAGNOSIS — Z794 Long term (current) use of insulin: Secondary | ICD-10-CM

## 2019-11-18 DIAGNOSIS — L89612 Pressure ulcer of right heel, stage 2: Secondary | ICD-10-CM | POA: Diagnosis present

## 2019-11-18 HISTORY — DX: Chronic diastolic (congestive) heart failure: I50.32

## 2019-11-18 LAB — COMPREHENSIVE METABOLIC PANEL
ALT: 17 U/L (ref 0–44)
AST: 20 U/L (ref 15–41)
Albumin: 3.8 g/dL (ref 3.5–5.0)
Alkaline Phosphatase: 98 U/L (ref 38–126)
Anion gap: 10 (ref 5–15)
BUN: 121 mg/dL — ABNORMAL HIGH (ref 8–23)
CO2: 17 mmol/L — ABNORMAL LOW (ref 22–32)
Calcium: 11.8 mg/dL — ABNORMAL HIGH (ref 8.9–10.3)
Chloride: 106 mmol/L (ref 98–111)
Creatinine, Ser: 2.37 mg/dL — ABNORMAL HIGH (ref 0.44–1.00)
GFR calc Af Amer: 23 mL/min — ABNORMAL LOW (ref >60)
GFR calc non Af Amer: 20 mL/min — ABNORMAL LOW (ref >60)
Glucose, Bld: 158 mg/dL — ABNORMAL HIGH (ref 70–99)
Potassium: >7.5 mmol/L (ref 3.5–5.1)
Sodium: 133 mmol/L — ABNORMAL LOW (ref 135–145)
Total Bilirubin: 1 mg/dL (ref 0.3–1.2)
Total Protein: 6.9 g/dL (ref 6.5–8.1)

## 2019-11-18 LAB — I-STAT CHEM 8, ED
BUN: 132 mg/dL — ABNORMAL HIGH (ref 8–23)
Calcium, Ion: 1.46 mmol/L — ABNORMAL HIGH (ref 1.15–1.40)
Chloride: 111 mmol/L (ref 98–111)
Creatinine, Ser: 2.2 mg/dL — ABNORMAL HIGH (ref 0.44–1.00)
Glucose, Bld: 152 mg/dL — ABNORMAL HIGH (ref 70–99)
HCT: 43 % (ref 36.0–46.0)
Hemoglobin: 14.6 g/dL (ref 12.0–15.0)
Potassium: 8.4 mmol/L (ref 3.5–5.1)
Sodium: 131 mmol/L — ABNORMAL LOW (ref 135–145)
TCO2: 19 mmol/L — ABNORMAL LOW (ref 22–32)

## 2019-11-18 LAB — POCT I-STAT EG7
Acid-base deficit: 8 mmol/L — ABNORMAL HIGH (ref 0.0–2.0)
Bicarbonate: 17 mmol/L — ABNORMAL LOW (ref 20.0–28.0)
Calcium, Ion: 1.46 mmol/L — ABNORMAL HIGH (ref 1.15–1.40)
HCT: 45 % (ref 36.0–46.0)
Hemoglobin: 15.3 g/dL — ABNORMAL HIGH (ref 12.0–15.0)
O2 Saturation: 53 %
Potassium: 8.4 mmol/L (ref 3.5–5.1)
Sodium: 131 mmol/L — ABNORMAL LOW (ref 135–145)
TCO2: 18 mmol/L — ABNORMAL LOW (ref 22–32)
pCO2, Ven: 34.5 mmHg — ABNORMAL LOW (ref 44.0–60.0)
pH, Ven: 7.3 (ref 7.250–7.430)
pO2, Ven: 31 mmHg — CL (ref 32.0–45.0)

## 2019-11-18 LAB — CBG MONITORING, ED
Glucose-Capillary: 145 mg/dL — ABNORMAL HIGH (ref 70–99)
Glucose-Capillary: 96 mg/dL (ref 70–99)

## 2019-11-18 LAB — CBC WITH DIFFERENTIAL/PLATELET
Abs Immature Granulocytes: 0.03 10*3/uL (ref 0.00–0.07)
Basophils Absolute: 0 10*3/uL (ref 0.0–0.1)
Basophils Relative: 0 %
Eosinophils Absolute: 0 10*3/uL (ref 0.0–0.5)
Eosinophils Relative: 0 %
HCT: 46.8 % — ABNORMAL HIGH (ref 36.0–46.0)
Hemoglobin: 15.6 g/dL — ABNORMAL HIGH (ref 12.0–15.0)
Immature Granulocytes: 0 %
Lymphocytes Relative: 10 %
Lymphs Abs: 1 10*3/uL (ref 0.7–4.0)
MCH: 29.9 pg (ref 26.0–34.0)
MCHC: 33.3 g/dL (ref 30.0–36.0)
MCV: 89.7 fL (ref 80.0–100.0)
Monocytes Absolute: 0.4 10*3/uL (ref 0.1–1.0)
Monocytes Relative: 4 %
Neutro Abs: 8.4 10*3/uL — ABNORMAL HIGH (ref 1.7–7.7)
Neutrophils Relative %: 86 %
Platelets: 271 10*3/uL (ref 150–400)
RBC: 5.22 MIL/uL — ABNORMAL HIGH (ref 3.87–5.11)
RDW: 13.8 % (ref 11.5–15.5)
WBC: 9.8 10*3/uL (ref 4.0–10.5)
nRBC: 0 % (ref 0.0–0.2)

## 2019-11-18 LAB — TSH: TSH: 2.728 u[IU]/mL (ref 0.350–4.500)

## 2019-11-18 LAB — MAGNESIUM: Magnesium: 2.2 mg/dL (ref 1.7–2.4)

## 2019-11-18 MED ORDER — FUROSEMIDE 10 MG/ML IJ SOLN
40.0000 mg | Freq: Once | INTRAMUSCULAR | Status: AC
  Administered 2019-11-18: 23:00:00 40 mg via INTRAVENOUS
  Filled 2019-11-18: qty 4

## 2019-11-18 MED ORDER — CHLORHEXIDINE GLUCONATE CLOTH 2 % EX PADS: 6.0000 | MEDICATED_PAD | Freq: Every day | CUTANEOUS | Status: DC

## 2019-11-18 MED ORDER — SODIUM BICARBONATE 8.4 % IV SOLN
INTRAVENOUS | Status: AC
  Filled 2019-11-18: qty 50

## 2019-11-18 MED ORDER — SODIUM CHLORIDE 0.9 % IV BOLUS
1000.0000 mL | Freq: Once | INTRAVENOUS | Status: AC
  Administered 2019-11-18: 23:00:00 1000 mL via INTRAVENOUS

## 2019-11-18 MED ORDER — CALCIUM GLUCONATE-NACL 1-0.675 GM/50ML-% IV SOLN
1.0000 g | Freq: Once | INTRAVENOUS | Status: AC
  Administered 2019-11-18: 22:00:00 1000 mg via INTRAVENOUS
  Filled 2019-11-18: qty 50

## 2019-11-18 MED ORDER — DEXTROSE 50 % IV SOLN
1.0000 | Freq: Once | INTRAVENOUS | Status: AC
  Administered 2019-11-18: 23:00:00 50 mL via INTRAVENOUS
  Filled 2019-11-18: qty 50

## 2019-11-18 MED ORDER — ALBUTEROL SULFATE HFA 108 (90 BASE) MCG/ACT IN AERS
8.0000 | INHALATION_SPRAY | Freq: Once | RESPIRATORY_TRACT | Status: AC
  Administered 2019-11-18: 23:00:00 8 via RESPIRATORY_TRACT
  Filled 2019-11-18 (×2): qty 6.7

## 2019-11-18 MED ORDER — ALBUTEROL SULFATE (2.5 MG/3ML) 0.083% IN NEBU: 10.0000 mg | INHALATION_SOLUTION | Freq: Once | RESPIRATORY_TRACT | Status: DC

## 2019-11-18 MED ORDER — INSULIN ASPART 100 UNIT/ML IV SOLN
5.0000 [IU] | Freq: Once | INTRAVENOUS | Status: AC
  Administered 2019-11-18: 23:00:00 5 [IU] via INTRAVENOUS

## 2019-11-18 MED ORDER — CALCIUM GLUCONATE-NACL 2-0.675 GM/100ML-% IV SOLN
2.0000 g | Freq: Once | INTRAVENOUS | Status: AC
  Administered 2019-11-19: 01:00:00 2000 mg via INTRAVENOUS
  Filled 2019-11-18: qty 100

## 2019-11-18 MED ORDER — INSULIN ASPART 100 UNIT/ML IV SOLN
10.0000 [IU] | Freq: Once | INTRAVENOUS | Status: AC
  Administered 2019-11-19: 01:00:00 10 [IU] via INTRAVENOUS

## 2019-11-18 MED ORDER — SODIUM BICARBONATE 8.4 % IV SOLN
50.0000 meq | Freq: Once | INTRAVENOUS | Status: AC
  Administered 2019-11-18: 50 meq via INTRAVENOUS

## 2019-11-18 MED ORDER — SODIUM ZIRCONIUM CYCLOSILICATE 5 G PO PACK
5.0000 g | PACK | Freq: Once | ORAL | Status: DC
  Filled 2019-11-18: qty 1

## 2019-11-18 MED ORDER — DEXTROSE 10 % IV SOLN
INTRAVENOUS | Status: DC
  Administered 2019-11-18 – 2019-11-19 (×2): via INTRAVENOUS

## 2019-11-18 NOTE — ED Provider Notes (Signed)
[facility] EMERGENCY DEPARTMENT Provider Note   CSN: [id number] Arrival date & time: 11/18/19  2119     History   Chief Complaint Chief Complaint  Patient presents with   Hypoglycemia    HPI Angela Hays is a 68 y.o. female.     HPI  Angela Hays is a 68 year old female with PMH of DM, HTN, CVA with residual L sided deficits who presents to the ED with hypoglycemia. Per EMS, family called out because patient is having generalized weakness and was found to be hypoglycemic.  They report her glucose was initially in 40s in route and she was given D10.  Also obtain an EKG that had hyperacute T waves concerning for hyperkalemia and she was given 1 g of calcium as well as 1 amp of bicarb.  On arrival, patient is alert and oriented.  She states she has been doing well but lately has not been eating or drinking as much because she does not like the food that is provided by her family.  Patient has abdominal pain.  She denies any fever or recent illness.  Patient has a history of prior stroke with chronic weakness but denies any new focal weakness.  Patient denies any dysuria.  She states she has been urinating today without difficulty.  Patient that she has been receiving her insulin at home for the last 2 days but has not been eating as much.   Per patient's son, she has not been eating and drinking much in the last few days. He states he called EMS because her sugar was low. Nephro note mentions fall from wheelchair however this was never reported by EMS and son/Caregiver denies any falls or trauma. Denies any known recent illness.   Past Medical History:  Diagnosis Date   Diabetes mellitus without complication ([place])    Hypertension    Stroke [facility])     Patient Active Problem List   Diagnosis Date Noted   Hypoglycemia due to insulin 11/19/2019   Abdominal pain 09/14/2019   Constipation 09/13/2019   Colitis 09/13/2019   HTN (hypertension) 09/13/2019    Weakness    Palliative care by specialist    Goals of care, counseling/discussion    Pressure injury of skin 06/17/2019   Hyperkalemia 06/16/2019   Renal insufficiency 06/16/2019   Hyponatremia [id number]   Metabolic acidosis [id number]   Chronic diastolic CHF (congestive heart failure) ([place]) 06/16/2019   Hypotension due to hypovolemia 06/16/2019   Elevated troponin 06/16/2019   Candidal intertrigo 06/16/2019   AKI (acute kidney injury) ([place])    ARF (acute renal failure) ([place]) 05/11/2016   Leg ulcer ([place]) 05/11/2016   Cellulitis and abscess of leg 05/06/2016   Sepsis ([place]) 01/05/2016   Pressure ulcer 01/05/2016   Leg swelling 01/05/2016   DM (diabetes mellitus), type 2 with peripheral vascular complications ([place]) [id number]   Hypertensive urgency 01/05/2016    Past Surgical History:  Procedure Laterality Date   CATARACT EXTRACTION  07/17/2019     OB History   No obstetric history on file.      Home Medications    Prior to Admission medications   Medication Sig Start Date End Date Taking? Authorizing Provider  amLODipine (NORVASC) 5 MG tablet Take 5 mg by mouth daily.   Yes [provider]  cloNIDine (CATAPRES) 0.2 MG tablet Take 0.2 mg by mouth 4 (four) times daily. 09/27/19  Yes [provider]  clopidogrel (PLAVIX) 75 MG tablet Take 75 mg  by mouth daily.   Yes [provider]  doxazosin (CARDURA) 4 MG tablet Take 4 mg by mouth at bedtime. 05/17/19  Yes [provider]  furosemide (LASIX) 40 MG tablet Take 0.5 tablets (20 mg total) by mouth daily as needed for fluid or edema (for weight gain of more then 2 Lbs in 1 day). Patient taking differently: Take 80 mg by mouth daily.  05/15/16  Yes Lavina Hamman, MD  Insulin Glargine (BASAGLAR KWIKPEN) 100 UNIT/ML SOPN Inject 15 Units into the skin 2 (two) times daily.  07/09/18  Yes [provider]  KLOR-CON M20 20 MEQ tablet Take 20 mEq by mouth daily. 05/24/18  Yes  [provider]  labetalol (NORMODYNE) 100 MG tablet Take 100 mg by mouth 2 (two) times daily. 06/06/18  Yes [provider]  mupirocin cream (BACTROBAN) 2 % Apply 1 application topically 2 (two) times daily. 10/17/19  Yes [provider]  polyethylene glycol (MIRALAX / GLYCOLAX) 17 g packet Take 17 g by mouth daily. 09/14/19  Yes Dhungel, Nishant, MD  simvastatin (ZOCOR) 20 MG tablet Take 20 mg by mouth daily with breakfast.   Yes [provider]    Family History No family history on file.  Social History Social History   Tobacco Use   Smoking status: Former Smoker   Smokeless tobacco: Never Used  Substance Use Topics   Alcohol use: No   Drug use: No     Allergies   Patient has no known allergies.   Review of Systems Review of Systems  Constitutional: Positive for appetite change. Negative for chills and fatigue.  HENT: Negative for congestion.   Respiratory: Negative for cough and shortness of breath.   Cardiovascular: Negative for chest pain.  Gastrointestinal: Negative for abdominal distention, diarrhea, nausea and vomiting.  Genitourinary: Negative for decreased urine volume, difficulty urinating and dysuria.  Neurological: Positive for weakness (chronic). Negative for headaches.  Psychiatric/Behavioral: Negative for agitation.     Physical Exam Updated Vital Signs BP (!) 101/55    Pulse 91    Temp 98.1 F (36.7 C)    Resp 20    SpO2 100%   Physical Exam Vitals signs and nursing note reviewed.  Constitutional:      General: She is not in acute distress.    Appearance: She is well-developed.  HENT:     Head: Normocephalic and atraumatic.     Mouth/Throat:     Mouth: Mucous membranes are dry.  Eyes:     Extraocular Movements: Extraocular movements intact.     Conjunctiva/sclera: Conjunctivae normal.     Pupils: Pupils are equal, round, and reactive to light.  Neck:     Musculoskeletal: Neck supple.  Cardiovascular:       Rate and Rhythm: Normal rate and regular rhythm.  Pulmonary:     Effort: Pulmonary effort is normal. No respiratory distress.     Breath sounds: Normal breath sounds.  Abdominal:     Palpations: Abdomen is soft.     Tenderness: There is no abdominal tenderness.  Skin:    General: Skin is warm and dry.  Neurological:     Mental Status: She is alert. Mental status is at baseline.     Comments: Chronic LHB weakness with contractures 5/5 grip strength in RUE Moves BLE to command       ED Treatments / Results  Labs (all labs ordered are listed, but only abnormal results are displayed) Labs Reviewed  CBC WITH DIFFERENTIAL/PLATELET -  Abnormal; Notable for the following components:      Result Value   RBC 5.22 (*)    Hemoglobin 15.6 (*)    HCT 46.8 (*)    Neutro Abs 8.4 (*)    All other components within normal limits  COMPREHENSIVE METABOLIC PANEL - Abnormal; Notable for the following components:   Sodium 133 (*)    Potassium >7.5 (*)    CO2 17 (*)    Glucose, Bld 158 (*)    BUN 121 (*)    Creatinine, Ser 2.37 (*)    Calcium 11.8 (*)    GFR calc non Af Amer 20 (*)    GFR calc Af Amer 23 (*)    All other components within normal limits  URINALYSIS, ROUTINE W REFLEX MICROSCOPIC - Abnormal; Notable for the following components:   Color, Urine STRAW (*)    Glucose, UA 50 (*)    Ketones, ur 5 (*)    All other components within normal limits  PROTEIN / CREATININE RATIO, URINE - Abnormal; Notable for the following components:   Protein Creatinine Ratio 0.50 (*)    All other components within normal limits  BASIC METABOLIC PANEL - Abnormal; Notable for the following components:   Glucose, Bld 100 (*)    BUN 60 (*)    Creatinine, Ser 1.31 (*)    GFR calc non Af Amer 41 (*)    GFR calc Af Amer 48 (*)    All other components within normal limits  HEMOGLOBIN A1C - Abnormal; Notable for the following components:   Hgb A1c MFr Bld 6.5 (*)    All other components within normal  limits  POTASSIUM - Abnormal; Notable for the following components:   Potassium 3.3 (*)    All other components within normal limits  GLUCOSE, CAPILLARY - Abnormal; Notable for the following components:   Glucose-Capillary 162 (*)    All other components within normal limits  GLUCOSE, CAPILLARY - Abnormal; Notable for the following components:   Glucose-Capillary 192 (*)    All other components within normal limits  GLUCOSE, CAPILLARY - Abnormal; Notable for the following components:   Glucose-Capillary 165 (*)    All other components within normal limits  BASIC METABOLIC PANEL - Abnormal; Notable for the following components:   Sodium 133 (*)    Glucose, Bld 232 (*)    BUN 32 (*)    Creatinine, Ser 1.38 (*)    Calcium 8.5 (*)    GFR calc non Af Amer 39 (*)    GFR calc Af Amer 45 (*)    All other components within normal limits  GLUCOSE, CAPILLARY - Abnormal; Notable for the following components:   Glucose-Capillary 201 (*)    All other components within normal limits  I-STAT CHEM 8, ED - Abnormal; Notable for the following components:   Sodium 131 (*)    Potassium 8.4 (*)    BUN 132 (*)    Creatinine, Ser 2.20 (*)    Glucose, Bld 152 (*)    Calcium, Ion 1.46 (*)    TCO2 19 (*)    All other components within normal limits  CBG MONITORING, ED - Abnormal; Notable for the following components:   Glucose-Capillary 145 (*)    All other components within normal limits  POCT I-STAT EG7 - Abnormal; Notable for the following components:   pCO2, Ven 34.5 (*)    pO2, Ven 31.0 (*)    Bicarbonate 17.0 (*)    TCO2 18 (*)  Acid-base deficit 8.0 (*)    Sodium 131 (*)    Potassium 8.4 (*)    Calcium, Ion 1.46 (*)    Hemoglobin 15.3 (*)    All other components within normal limits  SARS CORONAVIRUS 2 BY RT PCR (HOSPITAL ORDER, [place] [facility])  MRSA PCR SCREENING  MAGNESIUM  TSH  SODIUM, URINE, RANDOM  CK  HEPATITIS B SURFACE ANTIGEN  HEPATITIS B SURFACE  ANTIBODY,QUALITATIVE  HEPATITIS B CORE ANTIBODY, TOTAL  I-STAT VENOUS BLOOD GAS, ED  CBG MONITORING, ED  POC SARS CORONAVIRUS 2 AG -  ED  I-STAT CHEM 8, ED    EKG None  Radiology No results found.  Procedures Procedures (including critical care time)  Medications Ordered in ED Medications  dextrose 10 % infusion ( Intravenous New Bag/Given 11/19/19 1058)  Chlorhexidine Gluconate Cloth 2 % PADS 6 each (has no administration in time range)  lidocaine-prilocaine (EMLA) cream 1 application (has no administration in time range)  0.9 %  sodium chloride infusion (has no administration in time range)  0.9 %  sodium chloride infusion (has no administration in time range)  heparin injection 1,000 Units (has no administration in time range)  alteplase (CATHFLO ACTIVASE) injection 2 mg (has no administration in time range)  acetaminophen (TYLENOL) tablet 650 mg (has no administration in time range)    Or  acetaminophen (TYLENOL) suppository 650 mg (has no administration in time range)  calcium gluconate 1 g/ 50 mL sodium chloride IVPB (0 g Intravenous Stopped 11/18/19 2259)  sodium bicarbonate injection 50 mEq (50 mEq Intravenous Given 11/18/19 2157)  sodium chloride 0.9 % bolus 1,000 mL (1,000 mLs Intravenous New Bag/Given 11/18/19 2315)  insulin aspart (novoLOG) injection 5 Units (5 Units Intravenous Given 11/18/19 2307)    And  dextrose 50 % solution 50 mL (50 mLs Intravenous Given 11/18/19 2307)  albuterol (VENTOLIN HFA) 108 (90 Base) MCG/ACT inhaler 8 puff (8 puffs Inhalation Given 11/18/19 2318)  furosemide (LASIX) injection 40 mg (40 mg Intravenous Given 11/18/19 2318)  calcium gluconate 2 g/ 100 mL sodium chloride IVPB (0 g Intravenous Stopped 11/19/19 0141)  insulin aspart (novoLOG) injection 10 Units (10 Units Intravenous Given 11/19/19 0030)  sodium chloride 0.9 % bolus 500 mL (500 mLs Intravenous New Bag/Given 11/19/19 1249)     Initial Impression / Assessment and Plan / ED  Course  I have reviewed the triage vital signs and the nursing notes.  Pertinent labs & imaging results that were available during my care of the patient were reviewed by me and considered in my medical decision making (see chart for details).        On arrival, patient is afebrile, hemodynamically stable.  Patient is alert and oriented with no new deficits.  Repeat glucose 145  EKG with concern for hyperkalemia, patient has QRS widening as well as hyperacute T waves.  Patient was given another gram of calcium as well as another amp of bicarb on arrival and albuterol Tx ordered.  I-STAT shows potassium of 8.4.  This is consistent with CMP of greater than 7.5.  Creatinine is elevated (2.37, baseline closer to 1.4).  Patient reports she has been making urine today.  She was given IV Lasix as well as started on a D10 drip prophylactically to prevent rebound hypoglycemia.  Patient denies any infectious symptoms.  Abdomen is soft and benign.  Of note, patient is prescribed Lasix as well as supplemental potassium.  Is unclear if she has had a change  in how she has been taking her potassium.  Discussed case with on-call nephrology who recommends giving elimination therapies such as Kayexalate or Lokelma.  Lokelma was ordered.  Patient received insulin and dextrose.  Upon reassessment, repeat EKG has narrowing of the QRS and improvement of hyperacute T waves but they are still present.  Nephrology has seen and evaluated the patient at bedside and recommends temporary dialysis. CCM team to place VasCath.  Hospitalist contacted for admission. Plan for patient to undergo temporary dialysis for acute on chronic renal failure. Oncoming team aware.   Son updated on plan of care.   Final Clinical Impressions(s) / ED Diagnoses   Final diagnoses:  Hypoglycemia  Hyperkalemia    ED Discharge Orders    None       Burns Spain, MD 11/19/19 1451    Mesner, Corene Cornea, MD 11/20/19 [phone]

## 2019-11-18 NOTE — Consult Note (Signed)
TANITH RUTHER Admit Date: 11/18/2019 11/18/2019 Rexene Agent Requesting Physician:  Mesner MD  Reason for Consult:  Hyperkalemia HPI:  33F hx/o HTN, DM2, AKI, BL SCr 1.3 to 1.6 c/w CKD3, hx/o CVA with L sided paresis presented after fall from wheelchair and now with resultant and malaise.  With EMS was hypoglycemic.  EKG suggestive of hyperK and EMS gave Ca/BIcarb.  In ED workup found to have K > 7.5 and iStat K 8.4, 8.5.  EKG with peaked Ts and widened QRS. Given 1gm CaGluc, 5u insulin, ALb MDI, lasix 40, IVF bolus.  ALso with AKI, SCR 3.4 and BUN 132.    Pt is poor historian. Takes 20 mEq KCl/d, lasix.  Denies taking extra pills. No change to diet.  No recent ABX.  She is unclear if taking any NSAIDs.  No sig LEE, No SOB/cough/CP.    COVID pending.   Creatinine, Ser (mg/dL)  Date Value  11/18/2019 2.20 (H)  11/18/2019 2.37 (H)  09/14/2019 1.35 (H)  09/13/2019 1.72 (H)  09/13/2019 2.00 (H)  06/22/2019 1.36 (H)  06/21/2019 1.61 (H)  06/20/2019 1.53 (H)  06/19/2019 1.29 (H)  06/18/2019 1.37 (H)  ]  Potassium (mmol/L)  Date Value  11/18/2019 8.4 (HH)  11/18/2019 8.4 ([place])  11/18/2019 >7.5 (HH)  09/14/2019 3.4 (L)  09/13/2019 3.8   I/Os:  ROS Balance of 12 systems is negative w/ exceptions as above  PMH  Past Medical History:  Diagnosis Date  . Diabetes mellitus without complication ([place])   . Hypertension   . Stroke [facility])    [place]  Past Surgical History:  Procedure Laterality Date  . CATARACT EXTRACTION  07/17/2019   FH No family history on file. SH  reports that she has quit smoking. She has never used smokeless tobacco. She reports that she does not drink alcohol or use drugs. Allergies No Known Allergies Home medications Prior to Admission medications   Medication Sig Start Date End Date Taking? Authorizing Provider  amLODipine (NORVASC) 5 MG tablet Take 5 mg by mouth daily.    [provider]  clopidogrel (PLAVIX) 75 MG tablet Take 75 mg by  mouth daily.    [provider]  clotrimazole (LOTRIMIN) 1 % cream Apply topically 2 (two) times daily. Patient taking differently: Apply 1 application topically 2 (two) times daily as needed (butt sores).  06/22/19   Alma Friendly, MD  doxazosin (CARDURA) 4 MG tablet Take 4 mg by mouth at bedtime. 05/17/19   [provider]  furosemide (LASIX) 40 MG tablet Take 0.5 tablets (20 mg total) by mouth daily as needed for fluid or edema (for weight gain of more then 2 Lbs in 1 day). Patient taking differently: Take 80 mg by mouth daily.  05/15/16   Lavina Hamman, MD  Insulin Glargine (BASAGLAR KWIKPEN) 100 UNIT/ML SOPN Inject 15 Units into the skin 2 (two) times daily.  07/09/18   [provider]  KLOR-CON M20 20 MEQ tablet Take 20 mEq by mouth daily. 05/24/18   [provider]  labetalol (NORMODYNE) 100 MG tablet Take 100 mg by mouth 2 (two) times daily. 06/06/18   [provider]  polyethylene glycol (MIRALAX / GLYCOLAX) 17 g packet Take 17 g by mouth daily. 09/14/19   Dhungel, Flonnie Overman, MD  PRESCRIPTION MEDICATION Take 1 tablet by mouth once as needed (butt sores). Pt has old antibiotic per son that she took on 09/12/2019 for sores on buttocks.    [provider]  simvastatin (ZOCOR) 20 MG tablet Take 20 mg by mouth daily with breakfast.    [provider]    Current Medications Scheduled Meds: . [START ON 11/19/2019] albuterol  10 mg Nebulization Once  . [START ON 11/19/2019] Chlorhexidine Gluconate Cloth  6 each Topical Q0600  . [START ON 11/19/2019] insulin aspart  10 Units Intravenous Once  . sodium zirconium cyclosilicate  5 g Oral Once   Continuous Infusions: . [START ON 11/19/2019] calcium gluconate    . dextrose 100 mL/hr at 11/18/19 2318   PRN Meds:.  CBC Recent Labs  Lab 11/18/19 2135 11/18/19 2145  WBC 9.8  --   NEUTROABS 8.4*  --   HGB 15.6* 14.6  15.3*  HCT 46.8* 43.0  45.0  MCV 89.7  --   PLT 271  --     Basic Metabolic Panel Recent Labs  Lab 11/18/19 2135 11/18/19 2145  NA 133* 131*  131*  K >7.5* 8.4*  8.4*  CL 106 111  CO2 17*  --   GLUCOSE 158* 152*  BUN 121* 132*  CREATININE 2.37* 2.20*  CALCIUM 11.8*  --     Physical Exam  Blood pressure (!) 117/58, pulse 90, temperature (!) 97.2 F (36.2 C), temperature source Oral, resp. rate (!) 22, SpO2 96 %. GEN: NAD< hungry, chronically ill appearing ENT: NCAT EYES: EOMI CV: Regular,nl s1s2 PULM: CTAB ABD: s/nt/nd SKIN: no rashes/lesions EXT:No LEE   Assessment 75F with severe hyperkalemia, AoCKD3.  Unclear why, not on obvious culprit meds other than 30mEq/d of KCL.  NOt sure about NSAIDs. Probably some volume depletion.    1. Hyperkalemia, severe, EKG changes, unclear specific cause 2. AoCKD3, likely hypovolemic / pre-renal 3. DM2 presenting with hypoglycemia 4. HTN, not a current issue, not on RAASi 5. Hx/o CVA and L sided paresis  Plan 1. Best course is HD tonight, needs temp HD Cath, discussed with CCM.  Needs rapid covid screen d/w ED.   2. COnt to temporize: 2gm CaGLuc, 10mg  albuterol, 10units more IV insulin + dextrose.   3. HD: 3H, 1K, no UF, Qb 400.  iStat K at 54min move to 2K if < 5 4. Cont hydration / IVFs for #2 5. Check CK   Rexene Agent  [id number] pgr 11/18/2019, 11:59 PM

## 2019-11-18 NOTE — ED Triage Notes (Signed)
Patient with hypoglycemia with EMS, CBG initially of 48.  Patient was given 15g of D10 en route to ED.  EKG showed hyperkalemia and was given 1gm of CaCl and 71meq of  NaHCO3.  No chest pain, no shortness of breath, but she does have some weakness.  Patient is CAOx4.

## 2019-11-19 ENCOUNTER — Encounter (HOSPITAL_COMMUNITY): Payer: Self-pay | Admitting: General Practice

## 2019-11-19 ENCOUNTER — Other Ambulatory Visit: Payer: Self-pay

## 2019-11-19 DIAGNOSIS — N179 Acute kidney failure, unspecified: Secondary | ICD-10-CM | POA: Diagnosis present

## 2019-11-19 DIAGNOSIS — Z993 Dependence on wheelchair: Secondary | ICD-10-CM | POA: Diagnosis not present

## 2019-11-19 DIAGNOSIS — E875 Hyperkalemia: Secondary | ICD-10-CM

## 2019-11-19 DIAGNOSIS — T383X5A Adverse effect of insulin and oral hypoglycemic [antidiabetic] drugs, initial encounter: Secondary | ICD-10-CM | POA: Diagnosis present

## 2019-11-19 DIAGNOSIS — Z20828 Contact with and (suspected) exposure to other viral communicable diseases: Secondary | ICD-10-CM | POA: Diagnosis present

## 2019-11-19 DIAGNOSIS — Z87891 Personal history of nicotine dependence: Secondary | ICD-10-CM | POA: Diagnosis not present

## 2019-11-19 DIAGNOSIS — I129 Hypertensive chronic kidney disease with stage 1 through stage 4 chronic kidney disease, or unspecified chronic kidney disease: Secondary | ICD-10-CM | POA: Diagnosis present

## 2019-11-19 DIAGNOSIS — L89612 Pressure ulcer of right heel, stage 2: Secondary | ICD-10-CM | POA: Diagnosis present

## 2019-11-19 DIAGNOSIS — L89622 Pressure ulcer of left heel, stage 2: Secondary | ICD-10-CM | POA: Diagnosis present

## 2019-11-19 DIAGNOSIS — I10 Essential (primary) hypertension: Secondary | ICD-10-CM | POA: Diagnosis not present

## 2019-11-19 DIAGNOSIS — E1151 Type 2 diabetes mellitus with diabetic peripheral angiopathy without gangrene: Secondary | ICD-10-CM | POA: Diagnosis present

## 2019-11-19 DIAGNOSIS — N183 Chronic kidney disease, stage 3 unspecified: Secondary | ICD-10-CM | POA: Diagnosis present

## 2019-11-19 DIAGNOSIS — Z7902 Long term (current) use of antithrombotics/antiplatelets: Secondary | ICD-10-CM | POA: Diagnosis not present

## 2019-11-19 DIAGNOSIS — Z79899 Other long term (current) drug therapy: Secondary | ICD-10-CM | POA: Diagnosis not present

## 2019-11-19 DIAGNOSIS — E1122 Type 2 diabetes mellitus with diabetic chronic kidney disease: Secondary | ICD-10-CM | POA: Diagnosis present

## 2019-11-19 DIAGNOSIS — Z794 Long term (current) use of insulin: Secondary | ICD-10-CM | POA: Diagnosis not present

## 2019-11-19 DIAGNOSIS — E11649 Type 2 diabetes mellitus with hypoglycemia without coma: Secondary | ICD-10-CM | POA: Diagnosis present

## 2019-11-19 DIAGNOSIS — E160 Drug-induced hypoglycemia without coma: Secondary | ICD-10-CM

## 2019-11-19 DIAGNOSIS — I959 Hypotension, unspecified: Secondary | ICD-10-CM | POA: Diagnosis present

## 2019-11-19 DIAGNOSIS — L89312 Pressure ulcer of right buttock, stage 2: Secondary | ICD-10-CM | POA: Diagnosis present

## 2019-11-19 DIAGNOSIS — L89322 Pressure ulcer of left buttock, stage 2: Secondary | ICD-10-CM | POA: Diagnosis present

## 2019-11-19 DIAGNOSIS — E861 Hypovolemia: Secondary | ICD-10-CM | POA: Diagnosis present

## 2019-11-19 DIAGNOSIS — I69354 Hemiplegia and hemiparesis following cerebral infarction affecting left non-dominant side: Secondary | ICD-10-CM | POA: Diagnosis not present

## 2019-11-19 HISTORY — DX: Hyperkalemia: E87.5

## 2019-11-19 LAB — BASIC METABOLIC PANEL
Anion gap: 12 (ref 5–15)
Anion gap: 11 (ref 5–15)
BUN: 60 mg/dL — ABNORMAL HIGH (ref 8–23)
BUN: 32 mg/dL — ABNORMAL HIGH (ref 8–23)
CO2: 23 mmol/L (ref 22–32)
CO2: 23 mmol/L (ref 22–32)
Calcium: 9.9 mg/dL (ref 8.9–10.3)
Calcium: 8.5 mg/dL — ABNORMAL LOW (ref 8.9–10.3)
Chloride: 101 mmol/L (ref 98–111)
Chloride: 99 mmol/L (ref 98–111)
Creatinine, Ser: 1.31 mg/dL — ABNORMAL HIGH (ref 0.44–1.00)
Creatinine, Ser: 1.38 mg/dL — ABNORMAL HIGH (ref 0.44–1.00)
GFR calc Af Amer: 48 mL/min — ABNORMAL LOW (ref >60)
GFR calc Af Amer: 45 mL/min — ABNORMAL LOW (ref >60)
GFR calc non Af Amer: 41 mL/min — ABNORMAL LOW (ref >60)
GFR calc non Af Amer: 39 mL/min — ABNORMAL LOW (ref >60)
Glucose, Bld: 100 mg/dL — ABNORMAL HIGH (ref 70–99)
Glucose, Bld: 232 mg/dL — ABNORMAL HIGH (ref 70–99)
Potassium: 3.9 mmol/L (ref 3.5–5.1)
Potassium: 4.6 mmol/L (ref 3.5–5.1)
Sodium: 136 mmol/L (ref 135–145)
Sodium: 133 mmol/L — ABNORMAL LOW (ref 135–145)

## 2019-11-19 LAB — HEMOGLOBIN A1C
Hgb A1c MFr Bld: 6.5 % — ABNORMAL HIGH (ref 4.8–5.6)
Mean Plasma Glucose: 139.85 mg/dL

## 2019-11-19 LAB — POTASSIUM: Potassium: 3.3 mmol/L — ABNORMAL LOW (ref 3.5–5.1)

## 2019-11-19 LAB — POC SARS CORONAVIRUS 2 AG -  ED: SARS Coronavirus 2 Ag: NEGATIVE

## 2019-11-19 LAB — URINALYSIS, ROUTINE W REFLEX MICROSCOPIC
Bilirubin Urine: NEGATIVE
Glucose, UA: 50 mg/dL — AB
Hgb urine dipstick: NEGATIVE
Ketones, ur: 5 mg/dL — AB
Leukocytes,Ua: NEGATIVE
Nitrite: NEGATIVE
Protein, ur: NEGATIVE mg/dL
Specific Gravity, Urine: 1.009 (ref 1.005–1.030)
pH: 7 (ref 5.0–8.0)

## 2019-11-19 LAB — GLUCOSE, CAPILLARY
Glucose-Capillary: 162 mg/dL — ABNORMAL HIGH (ref 70–99)
Glucose-Capillary: 192 mg/dL — ABNORMAL HIGH (ref 70–99)
Glucose-Capillary: 165 mg/dL — ABNORMAL HIGH (ref 70–99)
Glucose-Capillary: 201 mg/dL — ABNORMAL HIGH (ref 70–99)
Glucose-Capillary: 281 mg/dL — ABNORMAL HIGH (ref 70–99)
Glucose-Capillary: 193 mg/dL — ABNORMAL HIGH (ref 70–99)

## 2019-11-19 LAB — HEPATITIS B SURFACE ANTIGEN: Hepatitis B Surface Ag: NONREACTIVE

## 2019-11-19 LAB — HEPATITIS B CORE ANTIBODY, TOTAL: Hep B Core Total Ab: NONREACTIVE

## 2019-11-19 LAB — SODIUM, URINE, RANDOM: Sodium, Ur: 68 mmol/L

## 2019-11-19 LAB — PROTEIN / CREATININE RATIO, URINE
Creatinine, Urine: 21.94 mg/dL
Protein Creatinine Ratio: 0.5 mg/mg{Cre} — ABNORMAL HIGH (ref 0.00–0.15)
Total Protein, Urine: 11 mg/dL

## 2019-11-19 LAB — MRSA PCR SCREENING: MRSA by PCR: NEGATIVE

## 2019-11-19 LAB — SARS CORONAVIRUS 2 BY RT PCR (HOSPITAL ORDER, PERFORMED IN ~~LOC~~ HOSPITAL LAB): SARS Coronavirus 2: NEGATIVE

## 2019-11-19 LAB — HEPATITIS B SURFACE ANTIBODY,QUALITATIVE: Hep B S Ab: NONREACTIVE

## 2019-11-19 LAB — CK: Total CK: 74 U/L (ref 38–234)

## 2019-11-19 MED ORDER — SODIUM CHLORIDE 0.9 % IV SOLN: 1.0000 g | Freq: Once | INTRAVENOUS | Status: DC

## 2019-11-19 MED ORDER — DEXTROSE 50 % IV SOLN: 1.0000 | Freq: Once | INTRAVENOUS | Status: DC

## 2019-11-19 MED ORDER — ALTEPLASE 2 MG IJ SOLR: 2.0000 mg | Freq: Once | INTRAMUSCULAR | Status: DC | PRN

## 2019-11-19 MED ORDER — HEPARIN SODIUM (PORCINE) 1000 UNIT/ML DIALYSIS
1000.0000 [IU] | INTRAMUSCULAR | Status: DC | PRN
  Filled 2019-11-19: qty 1

## 2019-11-19 MED ORDER — SODIUM CHLORIDE 0.9 % IV SOLN: 100.0000 mL | INTRAVENOUS | Status: DC | PRN

## 2019-11-19 MED ORDER — CALCIUM GLUCONATE-NACL 1-0.675 GM/50ML-% IV SOLN: 1.0000 g | Freq: Once | INTRAVENOUS | Status: DC

## 2019-11-19 MED ORDER — LIDOCAINE-PRILOCAINE 2.5-2.5 % EX CREA
1.0000 "application " | TOPICAL_CREAM | CUTANEOUS | Status: DC | PRN
  Filled 2019-11-19: qty 5

## 2019-11-19 MED ORDER — ACETAMINOPHEN 325 MG PO TABS: 650.0000 mg | ORAL_TABLET | Freq: Four times a day (QID) | ORAL | Status: DC | PRN

## 2019-11-19 MED ORDER — SODIUM CHLORIDE 0.9 % IV BOLUS
500.0000 mL | Freq: Once | INTRAVENOUS | Status: AC
  Administered 2019-11-19: 500 mL via INTRAVENOUS

## 2019-11-19 MED ORDER — ACETAMINOPHEN 650 MG RE SUPP: 650.0000 mg | Freq: Four times a day (QID) | RECTAL | Status: DC | PRN

## 2019-11-19 MED ORDER — DEXTROSE-NACL 5-0.9 % IV SOLN
INTRAVENOUS | Status: DC
  Administered 2019-11-19: 20:00:00 via INTRAVENOUS

## 2019-11-19 NOTE — Progress Notes (Signed)
[facility] Progress Note  Name: Angela Hays MRN: [id number] DOB: Dec 31, 1950  Chief Complaint:  Fall and malaise   Subjective:  S/p HD overnight for hyperkalemia.  States wasn't eating well before admission but eating ok now.  Hypotension overnight   Review of systems:  Denies shortness of breath or chest pain  No n/v  ---------- Background on consult:  59F hx/o HTN, DM2, AKI, BL SCr 1.3 to 1.6 c/w CKD3, hx/o CVA with L sided paresis presented after fall from wheelchair and now with resultant and malaise.  With EMS was hypoglycemic.  EKG suggestive of hyperK and EMS gave Ca/BIcarb.  In ED workup found to have K > 7.5 and iStat K 8.4, 8.5.  EKG with peaked Ts and widened QRS. Given 1gm CaGluc, 5u insulin, ALb MDI, lasix 40, IVF bolus.  ALso with AKI, SCR 3.4 and BUN 132.  Pt is poor historian. Takes 20 mEq KCl/d, lasix.  Denies taking extra pills. No change to diet.  No recent ABX.  She is unclear if taking any NSAIDs.  No sig LEE, No SOB/cough/CP.   covid negative    Intake/Output Summary (Last 24 hours) at 11/19/2019 1151 Last data filed at 11/19/2019 1127 Gross per 24 hour  Intake 268 ml  Output -500 ml  Net 768 ml    Vitals:  Vitals:   11/19/19 0515 11/19/19 0547 11/19/19 1000 11/19/19 1100  BP: (!) 102/54 (!) 99/56 90/65   Pulse: 98 (!) 103 94 87  Resp: 20 19 20  (!) 25  Temp: 98.4 F (36.9 C) 98.8 F (37.1 C) 98.1 F (36.7 C)   TempSrc: Oral Oral Oral   SpO2: 100% 100%  100%     Physical Exam:  General elderly female in bed in no acute distress HEENT normocephalic atraumatic extraocular movements intact sclera anicteric Neck supple trachea midline Lungs clear to auscultation bilaterally normal work of breathing at rest  Heart regular rate and rhythm no rubs or gallops appreciated Abdomen soft nontender nondistended Extremities no edema  Psych normal mood and affect  Access: right femoral nontunneled dialysis catheter   Medications reviewed    Labs:  BMP Latest Ref Rng & Units 11/19/2019 11/19/2019 11/18/2019  Glucose 70 - 99 mg/dL - 100(H) 152(H)  BUN 8 - 23 mg/dL - 60(H) 132(H)  Creatinine 0.44 - 1.00 mg/dL - 1.31(H) 2.20(H)  Sodium 135 - 145 mmol/L - 136 131(L)  Potassium 3.5 - 5.1 mmol/L 3.3(L) 3.9 8.4(HH)  Chloride 98 - 111 mmol/L - 101 111  CO2 22 - 32 mmol/L - 23 -  Calcium 8.9 - 10.3 mg/dL - 9.9 -     Assessment/Plan:   59F with severe hyperkalemia, AoCKD3.  Unclear why, not on obvious culprit meds other than 68mEq/d of K   1. Hyperkalemia, severe, EKG changes, unclear specific cause - is on potassium supplement and in setting of renal failure. CK normal  - s/p HD overnight 11/24 early am after placement of nontunneled catheter per critical care - resolved with HD   - repeat BMP to ensure no rebound hyperkalemia  2. AoCKD3, likely hypovolemic / pre-renal. Cr 2.37 on admit and per charting baseline 1.3 - 1.6  - s/p HD - Continue supportive care  - keep the nontunneled dialysis catheter for now  - bolus normal saline 500 mL once now  - while femoral catheter is in she will need to be on bedrest - ordered - hope to remove the catheter soon  3. DM2 presenting  with hypoglycemia  4. HTN, not a current issue, not on RAASi.  Normal saline as above  5. Hx/o CVA and L sided paresis - noted    Claudia Desanctis, MD 11/19/2019 11:51 AM

## 2019-11-19 NOTE — Progress Notes (Signed)
Instructed patient on procedure. Removed line and held pressure for 30 min. No bleeding noted at that time. Notified Programme researcher, broadcasting/film/video and requested she observe site in and continue to monitor. VU. Fran Lowes, RN

## 2019-11-19 NOTE — H&P (Signed)
History and Physical    Angela Hays [id number] DOB: [birth year] DOA: 11/18/2019  PCP: Maury Dus, MD Patient coming from: Home  Chief Complaint: Hypoglycemia  HPI: Angela Hays is a 68 y.o. female with medical history significant of CVA with residual left-sided weakness, wheelchair-bound, CKD 3, insulin-dependent diabetes mellitus, hypertension presenting to the ED via EMS for evaluation of hypoglycemia.  CBG 48 per EMS.  She was given 15 g of D10 in route to the ED.  EKG with hyperacute T waves concerning for hyperkalemia and was given 1 g calcium chloride and 50 mEq of sodium bicarb.  Patient states she has not been eating for the past few days as she does not like the taste of the food her son gives her.  She is upset that her son does not allow her to eat butter and rice.  States he tells her that she is not drinking enough fluids.  States her son has continued to give her insulin for the past few days.  She is not sure what dose she has been getting.  She is not sure what other medication she takes as her son manages them.  Denies nausea, vomiting, abdominal pain, or diarrhea.  Denies chest pain or shortness of breath.  Patient has no other complaints.  ED Course: Afebrile no leukocytosis.  Blood glucose 133.  Potassium 8.4.  EKG with hyperacute T waves and QRS widening.  Bicarb 19.  BUN 121, creatinine 2.3.  Creatinine was 1.3 on 09/14/2019.  TSH normal.  Mag normal.  VBG with pH 7.30.  CK pending.  UA pending.  Urine protein to creatinine ratio pending.  Urine sodium pending.  Rapid SARS-CoV-2 antigen test negative, PCR test pending. Patient received albuterol MDI, NovoLog 15 units, calcium gluconate 3 g, 1 amp D50, and IV Lasix 40 mg.  Received 1 L normal saline bolus.  She was started on D10 infusion. Patient was seen by nephrology and plan is to undergo urgent hemodialysis tonight. Temporary HD cath to be placed by PCCM.   Review of Systems:  All systems reviewed and apart from  history of presenting illness, are negative.  Past Medical History:  Diagnosis Date  . Diabetes mellitus without complication ([place])   . Hypertension   . Stroke [facility])     Past Surgical History:  Procedure Laterality Date  . CATARACT EXTRACTION  07/17/2019     reports that she has quit smoking. She has never used smokeless tobacco. She reports that she does not drink alcohol or use drugs.  No Known Allergies  No family history on file.  Prior to Admission medications   Medication Sig Start Date End Date Taking? Authorizing Provider  amLODipine (NORVASC) 5 MG tablet Take 5 mg by mouth daily.    [provider]  clopidogrel (PLAVIX) 75 MG tablet Take 75 mg by mouth daily.    [provider]  clotrimazole (LOTRIMIN) 1 % cream Apply topically 2 (two) times daily. Patient taking differently: Apply 1 application topically 2 (two) times daily as needed (butt sores).  06/22/19   Alma Friendly, MD  doxazosin (CARDURA) 4 MG tablet Take 4 mg by mouth at bedtime. 05/17/19   [provider]  furosemide (LASIX) 40 MG tablet Take 0.5 tablets (20 mg total) by mouth daily as needed for fluid or edema (for weight gain of more then 2 Lbs in 1 day). Patient taking differently: Take 80 mg by mouth daily.  05/15/16   Lavina Hamman, MD  Insulin Glargine (BASAGLAR KWIKPEN) 100 UNIT/ML SOPN Inject 15 Units into the skin 2 (two) times daily.  07/09/18   [provider]  KLOR-CON M20 20 MEQ tablet Take 20 mEq by mouth daily. 05/24/18   [provider]  labetalol (NORMODYNE) 100 MG tablet Take 100 mg by mouth 2 (two) times daily. 06/06/18   [provider]  polyethylene glycol (MIRALAX / GLYCOLAX) 17 g packet Take 17 g by mouth daily. 09/14/19   Dhungel, Flonnie Overman, MD  PRESCRIPTION MEDICATION Take 1 tablet by mouth once as needed (butt sores). Pt has old antibiotic per son that she took on 09/12/2019 for sores on buttocks.    [provider]   simvastatin (ZOCOR) 20 MG tablet Take 20 mg by mouth daily with breakfast.    [provider]    Physical Exam: Vitals:   11/18/19 2230 11/18/19 2245 11/18/19 2300 11/18/19 2345  BP: (!) 100/56 (!) 122/58 (!) 128/56 133/77  Pulse: 86 93 94 (!) 106  Resp: (!) 21 (!) 27 (!) 21 18  Temp:      TempSrc:      SpO2: 95% 96% 100% 100%    Physical Exam  Constitutional: She is oriented to person, place, and time. She appears well-developed and well-nourished. No distress.  HENT:  Head: Normocephalic.  Eyes: Right eye exhibits no discharge. Left eye exhibits no discharge.  Neck: Neck supple.  Cardiovascular: Normal rate, regular rhythm and intact distal pulses.  Pulmonary/Chest: Effort normal and breath sounds normal. No respiratory distress. She has no wheezes. She has no rales.  Abdominal: Soft. Bowel sounds are normal. She exhibits no distension. There is no abdominal tenderness. There is no guarding.  Musculoskeletal:        General: No edema.  Neurological: She is alert and oriented to person, place, and time.  Skin: Skin is warm and dry. She is not diaphoretic.     Labs on Admission: I have personally reviewed following labs and imaging studies  CBC: Recent Labs  Lab 11/18/19 2135 11/18/19 2145  WBC 9.8  --   NEUTROABS 8.4*  --   HGB 15.6* 14.6  15.3*  HCT 46.8* 43.0  45.0  MCV 89.7  --   PLT 271  --    Basic Metabolic Panel: Recent Labs  Lab 11/18/19 2135 11/18/19 2145  NA 133* 131*  131*  K >7.5* 8.4*  8.4*  CL 106 111  CO2 17*  --   GLUCOSE 158* 152*  BUN 121* 132*  CREATININE 2.37* 2.20*  CALCIUM 11.8*  --   MG 2.2  --    GFR: CrCl cannot be calculated (Unknown ideal weight.). Liver Function Tests: Recent Labs  Lab 11/18/19 2135  AST 20  ALT 17  ALKPHOS 98  BILITOT 1.0  PROT 6.9  ALBUMIN 3.8   No results for input(s): LIPASE, AMYLASE in the last 168 hours. No results for input(s): AMMONIA in the last 168 hours. Coagulation  Profile: No results for input(s): INR, PROTIME in the last 168 hours. Cardiac Enzymes: No results for input(s): CKTOTAL, CKMB, CKMBINDEX, TROPONINI in the last 168 hours. BNP (last 3 results) No results for input(s): PROBNP in the last 8760 hours. HbA1C: No results for input(s): HGBA1C in the last 72 hours. CBG: Recent Labs  Lab 11/18/19 2137 11/18/19 2247  GLUCAP 145* 96   Lipid Profile: No results for input(s): CHOL, HDL, LDLCALC, TRIG, CHOLHDL, LDLDIRECT in the last 72 hours. Thyroid Function Tests: Recent Labs  11/18/19 2133  TSH 2.728   Anemia Panel: No results for input(s): VITAMINB12, FOLATE, FERRITIN, TIBC, IRON, RETICCTPCT in the last 72 hours. Urine analysis:    Component Value Date/Time   COLORURINE STRAW (A) 11/19/2019 0048   APPEARANCEUR CLEAR 11/19/2019 0048   LABSPEC 1.009 11/19/2019 0048   PHURINE 7.0 11/19/2019 0048   GLUCOSEU 50 (A) 11/19/2019 0048   HGBUR NEGATIVE 11/19/2019 0048   BILIRUBINUR NEGATIVE 11/19/2019 0048   KETONESUR 5 (A) 11/19/2019 0048   PROTEINUR NEGATIVE 11/19/2019 0048   UROBILINOGEN 1.0 12/05/2007 1244   NITRITE NEGATIVE 11/19/2019 0048   LEUKOCYTESUR NEGATIVE 11/19/2019 0048    Radiological Exams on Admission: No results found.  EKG: Independently reviewed.  Sinus rhythm.  Initial EKG with hyperacute T waves and QRS widening.  Repeat EKG appears improved.  Assessment/Plan Principal Problem:   Hyperkalemia Active Problems:   DM (diabetes mellitus), type 2 with peripheral vascular complications (HCC)   AKI (acute kidney injury) ([place])   HTN (hypertension)   Hypoglycemia due to insulin   AKI on CKD 3, severe hyperkalemia with acute EKG changes BUN 121, creatinine 2.3.  Creatinine was 1.3 on 09/14/2019.  Bicarb 19.  Suspect prerenal due to dehydration/decreased p.o. intake and home diuretic use.  Labs showing severe hyperkalemia with potassium 8.4.  EKG with hyperacute T waves and QRS widening.  Patient only takes potassium  20 mEq daily at home.  Patient received medical treatment for hyperkalemia including albuterol, insulin, dextrose, calcium gluconate, and IV Lasix.  Received 1 L IV fluid bolus.  Seen by nephrology and plan is for patient to undergo urgent hemodialysis tonight.  Temporary HD catheter placed by PCCM.  Repeat EKG appears improved. -Nephrology following, urgent hemodialysis tonight -Repeat metabolic panel to check potassium level -CK pending Lokelma ordered. -Urine sodium, urine protein to creatinine ratio pending -UA pending -Cardiac monitoring -Monitor closely in the progressive care unit  Hypoglycemia in the setting of insulin-dependent diabetes mellitus Patient has continued to receive insulin at home despite not eating for the past few days.  CBG in the 40s by EMS and was started on D10 infusion. -Hold home insulin -Continue D10 infusion -CBG checks every 2 hours -Check A1c  Hypertension -Currently normotensive.  Pharmacy med rec pending.  History of CVA -Hold home Plavix at this time to reduce the risk of bleeding as temporary HD catheter was just placed  DVT prophylaxis: SCDs at this time to reduce the risk of bleeding as temporary HD catheter was just placed Code Status: Patient wishes to be full code. Family Communication: No family available at this time. Disposition Plan: Anticipate discharge after clinical improvement. Consults called: Nephrology Admission status: It is my clinical opinion that admission to INPATIENT is reasonable and necessary in this 68 y.o. female . presenting with AKI on CKD 3 and severe hyperkalemia with acute EKG changes.  Patient is undergoing urgent hemodialysis.  High risk of decompensation.  Given the aforementioned, the predictability of an adverse outcome is felt to be significant. I expect that the patient will require at least 2 midnights in the hospital to treat this condition.   The medical decision making on this patient was of high  complexity and the patient is at high risk for clinical deterioration, therefore this is a level 3 visit.  Shela Leff MD [facility] Pager [phone]  If 7PM-7AM, please contact night-coverage [email or web address] Password [email or web address]  11/19/2019, 1:54 AM

## 2019-11-19 NOTE — Procedures (Signed)
Central Venous Hemodialysis Catheter Insertion Procedure Note DEDREA DECOURCY [id number] 1951/10/23  Procedure: Insertion of Central Venous Hemodialysis Catheter Indications: Emergent dialysis for hyperkalemia with EKG changes  Procedure Details Consent: Risks of procedure as well as the alternatives and risks of each were explained to the (patient/caregiver).  Consent for procedure obtained. Time Out: Verified patient identification, verified procedure, site/side was marked, verified correct patient position, special equipment/implants available, medications/allergies/relevent history reviewed, required imaging and test results available.  Performed  Maximum sterile technique was used including antiseptics, cap, gloves, gown, hand hygiene, mask and sheet. Skin prep: Chlorhexidine; local anesthetic administered  Both right and left IJ sites were evaluated with ultrasound and felt not to be ideal for trialysis placement.  Therefore, a 20 cm trialysis triple lumen catheter was placed in the right femoral vein using the Seldinger technique.  Line sutured.  Biopatch and sterile dressing applied.   Heparin 1,000 units per ml (1.4 ml total) was placed in both red and blue ports and capped and taped.   Evaluation Blood flow good Complications: No apparent complications Patient did tolerate procedure well. Chest X-ray ordered to verify placement.  CXR: n/a.  Procedure performed with ultrasound guidance for real time vessel cannulation.     Kennieth Rad, MSN, AGACNP-BC [facility] 11/19/2019, 1:58 AM

## 2019-11-19 NOTE — Progress Notes (Signed)
Please see Dr Elon Jester note for detailed H&P.    68 year old lady with prior h/o CVA with residual left sided weakness, stage 3 CKD, IDDM, hypertension, presents to ED with hypoglycemia . Further lab work revealed hyperkalemia of 8.4 with abnormal EKG.  Nephrology consulted and she underwent urgent HD overnight. She also underwent non tunneled catheter placement by PCCM. Further management as per nephrology.   Pt seen and examined at bedside and she reports feeling great.  She reports ambulating using a wheelchair.    On exam, she is alert and appears to be in good spirits.  CVS s1s2, RRR, no JVD.  Lungs clear to auscultation,  abd is soft non tender bowel sounds wnl.  Ext: no pedal edema.  Neuro: alert and answering all questions appropriately. With left sided paresis.    Plan;  1. Change to dextrose NS fluids at 50 ml/hr.  2. BMP in am.  3  Further recommendations as per nephrology.  4. PT evaluation.  5. Nutrition consult.    Hosie Poisson, MD

## 2019-11-20 DIAGNOSIS — E1151 Type 2 diabetes mellitus with diabetic peripheral angiopathy without gangrene: Secondary | ICD-10-CM

## 2019-11-20 DIAGNOSIS — I10 Essential (primary) hypertension: Secondary | ICD-10-CM

## 2019-11-20 LAB — CBC
HCT: 35.3 % — ABNORMAL LOW (ref 36.0–46.0)
Hemoglobin: 11.6 g/dL — ABNORMAL LOW (ref 12.0–15.0)
MCH: 29.4 pg (ref 26.0–34.0)
MCHC: 32.9 g/dL (ref 30.0–36.0)
MCV: 89.6 fL (ref 80.0–100.0)
Platelets: 208 10*3/uL (ref 150–400)
RBC: 3.94 MIL/uL (ref 3.87–5.11)
RDW: 13.9 % (ref 11.5–15.5)
WBC: 8.1 10*3/uL (ref 4.0–10.5)
nRBC: 0 % (ref 0.0–0.2)

## 2019-11-20 LAB — BASIC METABOLIC PANEL
Anion gap: 12 (ref 5–15)
BUN: 33 mg/dL — ABNORMAL HIGH (ref 8–23)
CO2: 20 mmol/L — ABNORMAL LOW (ref 22–32)
Calcium: 7.8 mg/dL — ABNORMAL LOW (ref 8.9–10.3)
Chloride: 101 mmol/L (ref 98–111)
Creatinine, Ser: 1.83 mg/dL — ABNORMAL HIGH (ref 0.44–1.00)
GFR calc Af Amer: 32 mL/min — ABNORMAL LOW (ref >60)
GFR calc non Af Amer: 27 mL/min — ABNORMAL LOW (ref >60)
Glucose, Bld: 88 mg/dL (ref 70–99)
Potassium: 4.1 mmol/L (ref 3.5–5.1)
Sodium: 133 mmol/L — ABNORMAL LOW (ref 135–145)

## 2019-11-20 LAB — GLUCOSE, CAPILLARY
Glucose-Capillary: 102 mg/dL — ABNORMAL HIGH (ref 70–99)
Glucose-Capillary: 103 mg/dL — ABNORMAL HIGH (ref 70–99)
Glucose-Capillary: 212 mg/dL — ABNORMAL HIGH (ref 70–99)
Glucose-Capillary: 216 mg/dL — ABNORMAL HIGH (ref 70–99)
Glucose-Capillary: 232 mg/dL — ABNORMAL HIGH (ref 70–99)
Glucose-Capillary: 84 mg/dL (ref 70–99)

## 2019-11-20 MED ORDER — SODIUM CHLORIDE 0.9 % IV SOLN
INTRAVENOUS | Status: DC
  Administered 2019-11-20 – 2019-11-21 (×2): via INTRAVENOUS

## 2019-11-20 MED ORDER — GLUCERNA SHAKE PO LIQD
237.0000 mL | Freq: Three times a day (TID) | ORAL | Status: DC
  Administered 2019-11-20 – 2019-11-21 (×3): 237 mL via ORAL

## 2019-11-20 MED ORDER — PRO-STAT SUGAR FREE PO LIQD
30.0000 mL | Freq: Two times a day (BID) | ORAL | Status: DC
  Administered 2019-11-20 – 2019-11-21 (×2): 30 mL via ORAL
  Filled 2019-11-20: qty 30

## 2019-11-20 NOTE — Progress Notes (Signed)
[facility] Progress Note  Name: Angela Hays MRN: [id number] DOB: 08/30/1951  Chief Complaint:  Fall and malaise   Subjective:  700 mL UOP charted over 11/24.  Her femoral nontunneled dialysis catheter was removed overnight.  She feels ok.  Hypotension overnight.   Review of systems:    Denies shortness of breath or chest pain  No n/v  ---------- Background on consult:  27F hx/o HTN, DM2, AKI, BL SCr 1.3 to 1.6 c/w CKD3, hx/o CVA with L sided paresis presented after fall from wheelchair and now with resultant and malaise.  With EMS was hypoglycemic.  EKG suggestive of hyperK and EMS gave Ca/BIcarb.  In ED workup found to have K > 7.5 and iStat K 8.4, 8.5.  EKG with peaked Ts and widened QRS. Given 1gm CaGluc, 5u insulin, ALb MDI, lasix 40, IVF bolus.  ALso with AKI, SCR 3.4 and BUN 132.  Pt is poor historian. Takes 20 mEq KCl/d, lasix.  Denies taking extra pills. No change to diet.  No recent ABX.  She is unclear if taking any NSAIDs.  No sig LEE, No SOB/cough/CP.   covid negative    Intake/Output Summary (Last 24 hours) at 11/20/2019 1050 Last data filed at 11/20/2019 0905 Gross per 24 hour  Intake 2573.62 ml  Output 700 ml  Net 1873.62 ml    Vitals:  Vitals:   11/19/19 2017 11/20/19 0443 11/20/19 0447 11/20/19 0716  BP: 125/65 93/67  (!) 81/50  Pulse: 90 91  89  Resp: (!) 22 (!) 22 20 20   Temp: 99 F (37.2 C) 98.6 F (37 C)    TempSrc: Oral Oral    SpO2: 100% 99%  99%  Weight:  57.6 kg       Physical Exam:   General elderly female in bed in no acute distress HEENT normocephalic atraumatic extraocular movements intact sclera anicteric Neck supple trachea midline Lungs clear to auscultation bilaterally normal work of breathing at rest  Heart regular rate and rhythm no rubs or gallops appreciated Abdomen soft nontender nondistended Extremities no edema  Psych normal mood and affect  Access: bandaged overlying prior right femoral nontunneled dialysis  catheter which has been removed  Medications reviewed   Labs:  BMP Latest Ref Rng & Units 11/20/2019 11/19/2019 11/19/2019  Glucose 70 - 99 mg/dL 88 232(H) -  BUN 8 - 23 mg/dL 33(H) 32(H) -  Creatinine 0.44 - 1.00 mg/dL 1.83(H) 1.38(H) -  Sodium 135 - 145 mmol/L 133(L) 133(L) -  Potassium 3.5 - 5.1 mmol/L 4.1 4.6 3.3(L)  Chloride 98 - 111 mmol/L 101 99 -  CO2 22 - 32 mmol/L 20(L) 23 -  Calcium 8.9 - 10.3 mg/dL 7.8(L) 8.5(L) -     Assessment/Plan:   27F with severe hyperkalemia, AoCKD3.  Unclear why, not on obvious culprit meds other than 55mEq/d of K   1. Hyperkalemia, severe, EKG changes, unclear specific cause - is on potassium supplement and in setting of renal failure. CK normal  - s/p HD overnight 11/24 early am after placement of nontunneled catheter per critical care - resolved with HD - Please hold supplemental potassium on discharge   2. AKI likely hypovolemic / pre-renal as well as ischemic insults. Cr 2.37 on admit.   - s/p HD - Continue supportive care  - Please hold lasix  - nontunneled femoral catheter is out  - normal saline at 75 /hr    3. CKD stage III - Baseline 1.4 - 1.7.  Her  labs have improved over the course of her past hospitalizations to the lower end of the baseline and she had presented around 1.8 - 2.0 - possibly with improvement after medication titration?  May actually be CKD stage IV     4. DM type 2 presenting with hypoglycemia  5. HTN she is hypotensive - normal saline at 75 cc/hr  6. Hx/o CVA and L sided paresis - noted    Claudia Desanctis, MD 11/20/2019 10:50 AM

## 2019-11-20 NOTE — Progress Notes (Signed)
Inpatient Diabetes Program Recommendations  AACE/ADA: New Consensus Statement on Inpatient Glycemic Control (2015)  Target Ranges:  Prepandial:   less than 140 mg/dL      Peak postprandial:   less than 180 mg/dL (1-2 hours)      Critically ill patients:  140 - 180 mg/dL   Lab Results  Component Value Date   GLUCAP 102 (H) 11/20/2019   HGBA1C 6.5 (H) 11/19/2019    Review of Glycemic Control Results for Angela Hays, Angela Hays (MRN [id number]) as of 11/20/2019 10:00  Ref. Range 11/19/2019 16:16 11/19/2019 20:24 11/20/2019 00:27 11/20/2019 04:32 11/20/2019 07:37  Glucose-Capillary Latest Ref Range: 70 - 99 mg/dL 281 (H) 193 (H) 103 (H) 84 102 (H)   Diabetes history: DM2 Outpatient Diabetes medications: Basaglar 15 units bid Current orders for Inpatient glycemic control: None  Inpatient Diabetes Program Recommendations:   Noted hypoglycemia on admission and had not been eating regularly. Will need to have less insulin dose @ discharge. Will follow. May need custom insulin correction scale while in the hospital. -Custom Novolog correction scale 0-5 units       151-200  1 unit      201-250  2 units      251-300  3 units      301-350  4 units      351-400  5 units  Thank you, Bethena Roys E. Andruw Battie, RN, MSN, CDE  Diabetes Coordinator Inpatient Glycemic Control Team Team Pager [phone] (8am-5pm) 11/20/2019 10:04 AM

## 2019-11-20 NOTE — Progress Notes (Signed)
PROGRESS NOTE    TANGELA UCCELLO  [id number] DOB: 1951/05/02 DOA: 11/18/2019 PCP: Maury Dus, MD    Brief Narrative:  68 y.o. female with medical history significant of CVA with residual left-sided weakness, wheelchair-bound, CKD 3, insulin-dependent diabetes mellitus, hypertension presenting to the ED via EMS for evaluation of hypoglycemia.  CBG 48 per EMS.  She was given 15 g of D10 in route to the ED.  EKG with hyperacute T waves concerning for hyperkalemia and was given 1 g calcium chloride and 50 mEq of sodium bicarb.  Patient states she has not been eating for the past few days as she does not like the taste of the food her son gives her.  She is upset that her son does not allow her to eat butter and rice.  States he tells her that she is not drinking enough fluids.  States her son has continued to give her insulin for the past few days.  She is not sure what dose she has been getting.  She is not sure what other medication she takes as her son manages them.  Denies nausea, vomiting, abdominal pain, or diarrhea.  Denies chest pain or shortness of breath.  Patient has no other complaints.  ED Course: Afebrile no leukocytosis.  Blood glucose 133.  Potassium 8.4.  EKG with hyperacute T waves and QRS widening.  Bicarb 19.  BUN 121, creatinine 2.3.  Creatinine was 1.3 on 09/14/2019.  TSH normal.  Mag normal.  VBG with pH 7.30.  CK pending.  UA pending.  Urine protein to creatinine ratio pending.  Urine sodium pending.  Rapid SARS-CoV-2 antigen test negative, PCR test pending. Patient received albuterol MDI, NovoLog 15 units, calcium gluconate 3 g, 1 amp D50, and IV Lasix 40 mg.  Received 1 L normal saline bolus.  She was started on D10 infusion. Patient was seen by nephrology and plan is to undergo urgent hemodialysis tonight. Temporary HD cath to be placed by PCCM.   Assessment & Plan:   Principal Problem:   Hyperkalemia Active Problems:   DM (diabetes mellitus), type 2 with  peripheral vascular complications (HCC)   AKI (acute kidney injury) ([place])   HTN (hypertension)   Hypoglycemia due to insulin    AKI on CKD 3, severe hyperkalemia with acute EKG changes BUN 121, creatinine 2.3.  Creatinine was 1.3 on 09/14/2019.  Bicarb 19.  Suspect prerenal due to dehydration/decreased p.o. intake and home diuretic use.   -Presenting labs showing severe hyperkalemia with potassium 8.4.  EKG with hyperacute T waves and QRS widening.  Patient only takes potassium 20 mEq daily at home.  Patient received medical treatment for hyperkalemia including albuterol, insulin, dextrose, calcium gluconate, and IV Lasix.  -Nephrology consulted and pt ultimately required emergent HD on 11/24 -Potassium normalized -Repeat BMET in AM  Hypoglycemia in the setting of insulin-dependent diabetes mellitus -Patient has continued to receive insulin at home despite not eating for the past few days.  CBG in the 40s by EMS and was started on D10 infusion. -Hold home insulin  -Required D10 fluids, now on NS -A1c of 6.5  Hypertension -Noted to be normotensive at time of presentation -Today noted to be hypotensive this AM -on basal IVF  History of CVA -Held home Plavix to the risk of bleeding for temporary HD catheter at time of presentation -Would resume plavix when stable  DVT prophylaxis: SCD's Code Status: Full Code Family Communication: Pt in room, family not at bedside Disposition Plan: Uncertain at  this time  Consultants:   Nephrology  Procedures:   Emergent HD 11/24  Antimicrobials: Anti-infectives (From admission, onward)   None       Subjective: Without complaints  Objective: Vitals:   11/20/19 0443 11/20/19 0447 11/20/19 0716 11/20/19 1601  BP: 93/67  (!) 81/50   Pulse: 91  89 98  Resp: (!) 22 20 20 20   Temp: 98.6 F (37 C)   98.4 F (36.9 C)  TempSrc: Oral   Oral  SpO2: 99%  99% 100%  Weight: 57.6 kg       Intake/Output Summary (Last 24 hours) at  11/20/2019 1903 Last data filed at 11/20/2019 1745 Gross per 24 hour  Intake 572.74 ml  Output 600 ml  Net -27.26 ml   Filed Weights   11/20/19 0443  Weight: 57.6 kg    Examination:  General exam: Appears calm and comfortable  Respiratory system: Clear to auscultation. Respiratory effort normal. Cardiovascular system: S1 & S2 heard, Regular Gastrointestinal system: Abdomen is nondistended, soft and nontender. No organomegaly or masses felt. Normal bowel sounds heard. Central nervous system: Alert and oriented. No focal neurological deficits. Extremities: Symmetric 5 x 5 power. Skin: No rashes, lesions  Psychiatry: Judgement and insight appear normal. Mood & affect appropriate.   Data Reviewed: I have personally reviewed following labs and imaging studies  CBC: Recent Labs  Lab 11/18/19 2135 11/18/19 2145 11/20/19 0339  WBC 9.8  --  8.1  NEUTROABS 8.4*  --   --   HGB 15.6* 14.6  15.3* 11.6*  HCT 46.8* 43.0  45.0 35.3*  MCV 89.7  --  89.6  PLT 271  --  [id number]   Basic Metabolic Panel: Recent Labs  Lab 11/18/19 2135 11/18/19 2145 11/19/19 0246 11/19/19 0352 11/19/19 1225 11/20/19 0339  NA 133* 131*  131* 136  --  133* 133*  K >7.5* 8.4*  8.4* 3.9 3.3* 4.6 4.1  CL 106 111 101  --  99 101  CO2 17*  --  23  --  23 20*  GLUCOSE 158* 152* 100*  --  232* 88  BUN 121* 132* 60*  --  32* 33*  CREATININE 2.37* 2.20* 1.31*  --  1.38* 1.83*  CALCIUM 11.8*  --  9.9  --  8.5* 7.8*  MG 2.2  --   --   --   --   --    GFR: Estimated Creatinine Clearance: 23.7 mL/min (A) (by C-G formula based on SCr of 1.83 mg/dL (H)). Liver Function Tests: Recent Labs  Lab 11/18/19 2135  AST 20  ALT 17  ALKPHOS 98  BILITOT 1.0  PROT 6.9  ALBUMIN 3.8   No results for input(s): LIPASE, AMYLASE in the last 168 hours. No results for input(s): AMMONIA in the last 168 hours. Coagulation Profile: No results for input(s): INR, PROTIME in the last 168 hours. Cardiac Enzymes: Recent Labs   Lab 11/19/19 0352  CKTOTAL 74   BNP (last 3 results) No results for input(s): PROBNP in the last 8760 hours. HbA1C: Recent Labs    11/19/19 0314  HGBA1C 6.5*   CBG: Recent Labs  Lab 11/20/19 0027 11/20/19 0432 11/20/19 0737 11/20/19 1119 11/20/19 1558  GLUCAP 103* 84 102* 216* 232*   Lipid Profile: No results for input(s): CHOL, HDL, LDLCALC, TRIG, CHOLHDL, LDLDIRECT in the last 72 hours. Thyroid Function Tests: Recent Labs    11/18/19 2133  TSH 2.728   Anemia Panel: No results for input(s): VITAMINB12, FOLATE, FERRITIN, TIBC,  IRON, RETICCTPCT in the last 72 hours. Sepsis Labs: No results for input(s): PROCALCITON, LATICACIDVEN in the last 168 hours.  Recent Results (from the past 240 hour(s))  SARS Coronavirus 2 by RT PCR (hospital order, performed in [facility] hospital lab) Nasopharyngeal Nasopharyngeal Swab     Status: None   Collection Time: 11/19/19 12:43 AM   Specimen: Nasopharyngeal Swab  Result Value Ref Range Status   SARS Coronavirus 2 NEGATIVE NEGATIVE Final    Comment: (NOTE) If result is NEGATIVE SARS-CoV-2 target nucleic acids are NOT DETECTED. The SARS-CoV-2 RNA is generally detectable in upper and lower  respiratory specimens during the acute phase of infection. The lowest  concentration of SARS-CoV-2 viral copies this assay can detect is 250  copies / mL. A negative result does not preclude SARS-CoV-2 infection  and should not be used as the sole basis for treatment or other  patient management decisions.  A negative result may occur with  improper specimen collection / handling, submission of specimen other  than nasopharyngeal swab, presence of viral mutation(s) within the  areas targeted by this assay, and inadequate number of viral copies  (<250 copies / mL). A negative result must be combined with clinical  observations, patient history, and epidemiological information. If result is POSITIVE SARS-CoV-2 target nucleic acids are DETECTED.  The SARS-CoV-2 RNA is generally detectable in upper and lower  respiratory specimens dur ing the acute phase of infection.  Positive  results are indicative of active infection with SARS-CoV-2.  Clinical  correlation with patient history and other diagnostic information is  necessary to determine patient infection status.  Positive results do  not rule out bacterial infection or co-infection with other viruses. If result is PRESUMPTIVE POSTIVE SARS-CoV-2 nucleic acids MAY BE PRESENT.   A presumptive positive result was obtained on the submitted specimen  and confirmed on repeat testing.  While 2019 novel coronavirus  (SARS-CoV-2) nucleic acids may be present in the submitted sample  additional confirmatory testing may be necessary for epidemiological  and / or clinical management purposes  to differentiate between  SARS-CoV-2 and other Sarbecovirus currently known to infect humans.  If clinically indicated additional testing with an alternate test  methodology [phone]) is advised. The SARS-CoV-2 RNA is generally  detectable in upper and lower respiratory sp ecimens during the acute  phase of infection. The expected result is Negative. Fact Sheet for Patients:  StrictlyIdeas.no Fact Sheet for Healthcare Providers: BankingDealers.co.za This test is not yet approved or cleared by the [place] FDA and has been authorized for detection and/or diagnosis of SARS-CoV-2 by FDA under an Emergency Use Authorization (EUA).  This EUA will remain in effect (meaning this test can be used) for the duration of the COVID-19 declaration under Section 564(b)(1) of the Act, 21 U.S.C. section 360bbb-3(b)(1), unless the authorization is terminated or revoked sooner. Performed at [facility], [place] [address] [place], [place] [address]   MRSA PCR Screening     Status: None   Collection Time: 11/19/19  5:59 AM   Specimen: Nasopharyngeal  Result  Value Ref Range Status   MRSA by PCR NEGATIVE NEGATIVE Final    Comment:        The GeneXpert MRSA Assay (FDA approved for NASAL specimens only), is one component of a comprehensive MRSA colonization surveillance program. It is not intended to diagnose MRSA infection nor to guide or monitor treatment for MRSA infections. Performed at [facility], [place] [address] [place], [place]  [place]      Radiology Studies: No results found.  Scheduled Meds: . Chlorhexidine Gluconate Cloth  6 each Topical Q0600  . feeding supplement (GLUCERNA SHAKE)  237 mL Oral TID BM  . feeding supplement (PRO-STAT SUGAR FREE 64)  30 mL Oral BID   Continuous Infusions: . sodium chloride    . sodium chloride    . sodium chloride 75 mL/hr at 11/20/19 1209     LOS: 1 day   Marylu Lund, MD [facility] Pager On Amion  If 7PM-7AM, please contact night-coverage 11/20/2019, 7:03 PM

## 2019-11-20 NOTE — Progress Notes (Signed)
Initial Nutrition Assessment  INTERVENTION:   -Glucerna Shake po TID, each supplement provides 220 kcal and 10 grams of protein -Prostat liquid protein PO 30 ml BID with meals, each supplement provides 100 kcal, 15 grams protein.  NUTRITION DIAGNOSIS:   Increased nutrient needs related to chronic illness, wound healing as evidenced by estimated needs.  GOAL:   Patient will meet greater than or equal to 90% of their needs  MONITOR:   PO intake, Supplement acceptance, Labs, Weight trends, Skin, I & O's  REASON FOR ASSESSMENT:   Consult Assessment of nutrition requirement/status  ASSESSMENT:   68 y.o. female with medical history significant of CVA with residual left-sided weakness, wheelchair-bound, CKD 3, insulin-dependent diabetes mellitus, hypertension presenting to the ED via EMS for evaluation of hypoglycemia.  11/24: underwent nontunneled catheter placement, urgent HD 11/25 -nontunneled catheter removed  **RD working remotely**  Patient reporting she was not eating well PTA d/t what her son was providing for meals. Pt did not like the taste. Eating better now, consumed 75% of breakfast this morning, providing ~430 kcals and 18g protein. Will order Glucerna supplements and Prostat for additional kcals and protein given recent poor PO and multiple pressure injuries.  Per weight records, pt has lost 9 lbs since 9/18 (6% wt loss x 2 months, significant for time frame).  Pt is wheelchair bound. Has left sided weakness d/t h/o CVA.  I/Os: +2.5L since admit Last HD 11/24: 500 ml UF UOP: 200 ml x 24 hours  Medications reviewed. Labs reviewed: CBGs: 84-102 Low Na  NUTRITION - FOCUSED PHYSICAL EXAM:  Working remotely.  Diet Order:   Diet Order            Diet renal with fluid restriction Fluid restriction: 1200 mL Fluid; Room service appropriate? Yes; Fluid consistency: Thin  Diet effective now              EDUCATION NEEDS:   No education needs have been  identified at this time  Skin:  Skin Assessment: Skin Integrity Issues: Skin Integrity Issues:: Other (Comment), Stage II Stage II: right & left buttocks, left elbow, right and left heels Other: healing burn to right breast  Last BM:  11/24  Height:   Ht Readings from Last 1 Encounters:  09/13/19 5\' 3"  (1.6 m)    Weight:   Wt Readings from Last 1 Encounters:  11/20/19 57.6 kg    Ideal Body Weight:  52.3 kg  BMI:  Body mass index is 22.49 kg/m.  Estimated Nutritional Needs:   Kcal:  1700-1900  Protein:  70-80g  Fluid:  UOP +1 L  Clayton Bibles, MS, RD, LDN Inpatient Clinical Dietitian Pager: [phone] After Hours Pager: [phone]

## 2019-11-20 NOTE — Consult Note (Signed)
WOC Nurse Consult Note: Reason for Consult:pressure injury Wound type: Bilateral heels intact Partial thickness skin loss right pretibial Lesions over the bilateral buttocks; appear superficial but have scant drainage. Unclear etiology; only noted over buttocks?  Left hip completed healed area; 100% re-epithelialized  Pressure Injury POA: NA Measurement: Right pretibial: 1cm x 1cm x 0.1cm  Scattered buttock lesions; tiny 0.2cm x 0.2cm covering both left and right buttock Wound bed: Right pretibial; clean, pink, moist Buttocks; crusted but with some slight drainage.  Drainage (amount, consistency, odor) buttocks; brown/yellow  Periwound: intact  Dressing procedure/placement/frequency: Silicone foam to vunerable heels; Silicone foam to right pretibial area Silicone foam over the buttocks to protect and absorb any drainage.   Discussed POC with patient and bedside nurse.  Re consult if needed, will not follow at this time. Thanks  Ardyth Kelso [employer], RN,CWOCN, CNS, [place] [phone])

## 2019-11-20 NOTE — Evaluation (Signed)
Physical Therapy Evaluation Patient Details Name: Angela Hays MRN: [id number] DOB: 1951-11-07 Today's Date: 11/20/2019   History of Present Illness  Angela Hays is a 68 y.o. female with medical history significant for insulin-dependent diabetes, hypertension, and history of CVA (L-sided residual weakness), now presenting to the emergency department due to hypoglycemia and hyperkalemia. PTA WC bound and living with son.  Clinical Impression  Pt in bed upon PT arrival, agreeable to PT session and attempt to transfer to recliner. Pt demos sig limitations in mobility and transfer ability, but is at baseline for most transfers and mobility (pt son performs transfers at baseline, pt uses WC for mobility). Pt requires maxA of 2 to complete lateral squat pivot transfer to recliner, will benefit from continued skilled PT to address functional transfers and mobility that may decrease caregiver burden.     Follow Up Recommendations Home health PT;Supervision/Assistance - 24 hour    Equipment Recommendations  None recommended by PT    Recommendations for Other Services       Precautions / Restrictions Precautions Precautions: Fall Restrictions Weight Bearing Restrictions: No      Mobility  Bed Mobility Overal bed mobility: Needs Assistance Bed Mobility: Rolling;Supine to Sit Rolling: Modified independent (Device/Increase time)   Supine to sit: Mod assist     General bed mobility comments: modA of 1 for trunk elevation and repositioning, pt able to move BLE out of bed without assist  Transfers Overall transfer level: Needs assistance Equipment used: 2 person hand held assist Transfers: Lateral/Scoot Transfers;Squat Pivot Transfers     Squat pivot transfers: Max assist;+2 physical assistance    Lateral/Scoot Transfers: Max assist;+2 physical assistance General transfer comment: Pt reports son does maxA lift at home for trasnfers, sttempted squat pivot trasnfer with assist of 2,  pt unable to assist with BLE due to lack of full ext ROM and LUE weakness. Possibly attempt with slideboard or stedy.  Ambulation/Gait             General Gait Details: pt reports she has not ambulated in years, NT  Stairs            Wheelchair Mobility    Modified Rankin (Stroke Patients Only) Modified Rankin (Stroke Patients Only) Pre-Morbid Rankin Score: No significant disability Modified Rankin: Severe disability     Balance Overall balance assessment: Needs assistance Sitting-balance support: Single extremity supported;Feet supported Sitting balance-Leahy Scale: Fair Sitting balance - Comments: once sitting squarely EOB, supervision with feet and one UE supported, post lean initially Postural control: Posterior lean     Standing balance comment: unable                             Pertinent Vitals/Pain Pain Assessment: No/denies pain    Home Living Family/patient expects to be discharged to:: Private residence Living Arrangements: Children(son) Available Help at Discharge: Family Type of Home: House Home Access: Level entry     Home Layout: One level Home Equipment: Wheelchair - manual;Bedside commode Additional Comments: pt reports lives with son, who helps her with transfers, ADLs, and bathing    Prior Function Level of Independence: Needs assistance   Gait / Transfers Assistance Needed: non ambulatory for many years. reports son places in [facility], stays up while son workks and and he checks on her. Son  unavailable to clarify.  ADL's / Homemaking Assistance Needed: states son bathes her in the bed, she transfers to [facility] with assist, son cooks  and cleans, pt feeds herself  Comments: Pt reports using w/c for mobility around the house but able to do her own squat-pivot transfers. Son did assist with getting into bed.     Hand Dominance   Dominant Hand: Right    Extremity/Trunk Assessment   Upper Extremity Assessment Upper Extremity  Assessment: LUE deficits/detail;Generalized weakness LUE Deficits / Details: prior CVA with L-sided residual weakness and elbow flex contracture LUE: Unable to fully assess due to immobilization    Lower Extremity Assessment Lower Extremity Assessment: Generalized weakness;LLE deficits/detail;RLE deficits/detail RLE Deficits / Details: generalized weak, limited knee extension PROM and AROM RLE: Unable to fully assess due to immobilization LLE Deficits / Details: prior CVA with residual L-sided weakness and sig knee flexion contracture, 3 beat clonus L PFs LLE: Unable to fully assess due to immobilization    Cervical / Trunk Assessment Cervical / Trunk Assessment: Normal  Communication   Communication: No difficulties  Cognition Arousal/Alertness: Awake/alert Behavior During Therapy: WFL for tasks assessed/performed Overall Cognitive Status: Within Functional Limits for tasks assessed                                        General Comments      Exercises     Assessment/Plan    PT Assessment Patient needs continued PT services  PT Problem List Decreased strength;Decreased balance;Impaired tone;Decreased range of motion;Decreased mobility;Decreased activity tolerance;Decreased coordination       PT Treatment Interventions Functional mobility training;Balance training;Patient/family education;Therapeutic activities;Therapeutic exercise    PT Goals (Current goals can be found in the Care Plan section)  Acute Rehab PT Goals Patient Stated Goal: return home with son PT Goal Formulation: With patient Time For Goal Achievement: 12/04/19 Potential to Achieve Goals: Good    Frequency Min 3X/week   Barriers to discharge        Co-evaluation               AM-PAC PT "6 Clicks" Mobility  Outcome Measure Help needed turning from your back to your side while in a flat bed without using bedrails?: None Help needed moving from lying on your back to sitting  on the side of a flat bed without using bedrails?: A Little Help needed moving to and from a bed to a chair (including a wheelchair)?: Total Help needed standing up from a chair using your arms (e.g., wheelchair or bedside chair)?: Total Help needed to walk in hospital room?: Total Help needed climbing 3-5 steps with a railing? : Total 6 Click Score: 11    End of Session Equipment Utilized During Treatment: Gait belt Activity Tolerance: Patient tolerated treatment well;No increased pain Patient left: in chair;with call bell/phone within reach;with chair alarm set Nurse Communication: Mobility status;Need for lift equipment(recommend use of lift with pt to return to bed) PT Visit Diagnosis: Other abnormalities of gait and mobility (R26.89);Hemiplegia and hemiparesis Hemiplegia - Right/Left: Left Hemiplegia - dominant/non-dominant: Non-dominant Hemiplegia - caused by: Unspecified    Time: [id number] PT Time Calculation (min) (ACUTE ONLY): 36 min   Charges:   PT Evaluation $PT Eval Moderate Complexity: 1 Mod PT Treatments $Therapeutic Activity: 8-22 mins        Mickey Farber, PT, DPT   Acute Rehabilitation Department [phone]  Otho Bellows 11/20/2019, 12:51 PM

## 2019-11-21 LAB — COMPREHENSIVE METABOLIC PANEL
ALT: 14 U/L (ref 0–44)
AST: 18 U/L (ref 15–41)
Albumin: 2.6 g/dL — ABNORMAL LOW (ref 3.5–5.0)
Alkaline Phosphatase: 77 U/L (ref 38–126)
Anion gap: 10 (ref 5–15)
BUN: 41 mg/dL — ABNORMAL HIGH (ref 8–23)
CO2: 21 mmol/L — ABNORMAL LOW (ref 22–32)
Calcium: 7.7 mg/dL — ABNORMAL LOW (ref 8.9–10.3)
Chloride: 107 mmol/L (ref 98–111)
Creatinine, Ser: 2.09 mg/dL — ABNORMAL HIGH (ref 0.44–1.00)
GFR calc Af Amer: 27 mL/min — ABNORMAL LOW (ref >60)
GFR calc non Af Amer: 23 mL/min — ABNORMAL LOW (ref >60)
Glucose, Bld: 115 mg/dL — ABNORMAL HIGH (ref 70–99)
Potassium: 3.3 mmol/L — ABNORMAL LOW (ref 3.5–5.1)
Sodium: 138 mmol/L (ref 135–145)
Total Bilirubin: 0.5 mg/dL (ref 0.3–1.2)
Total Protein: 4.9 g/dL — ABNORMAL LOW (ref 6.5–8.1)

## 2019-11-21 LAB — CBC
HCT: 32.5 % — ABNORMAL LOW (ref 36.0–46.0)
Hemoglobin: 10.9 g/dL — ABNORMAL LOW (ref 12.0–15.0)
MCH: 29.9 pg (ref 26.0–34.0)
MCHC: 33.5 g/dL (ref 30.0–36.0)
MCV: 89 fL (ref 80.0–100.0)
Platelets: 179 10*3/uL (ref 150–400)
RBC: 3.65 MIL/uL — ABNORMAL LOW (ref 3.87–5.11)
RDW: 13.8 % (ref 11.5–15.5)
WBC: 7.8 10*3/uL (ref 4.0–10.5)
nRBC: 0 % (ref 0.0–0.2)

## 2019-11-21 LAB — GLUCOSE, CAPILLARY
Glucose-Capillary: 158 mg/dL — ABNORMAL HIGH (ref 70–99)
Glucose-Capillary: 160 mg/dL — ABNORMAL HIGH (ref 70–99)
Glucose-Capillary: 163 mg/dL — ABNORMAL HIGH (ref 70–99)
Glucose-Capillary: 97 mg/dL (ref 70–99)
Glucose-Capillary: 99 mg/dL (ref 70–99)

## 2019-11-21 MED ORDER — CLOPIDOGREL BISULFATE 75 MG PO TABS
75.0000 mg | ORAL_TABLET | Freq: Every day | ORAL | Status: DC
  Administered 2019-11-21: 17:00:00 75 mg via ORAL
  Filled 2019-11-21: qty 1

## 2019-11-21 MED ORDER — POTASSIUM CHLORIDE CRYS ER 20 MEQ PO TBCR
20.0000 meq | EXTENDED_RELEASE_TABLET | Freq: Once | ORAL | Status: AC
  Administered 2019-11-21: 13:00:00 20 meq via ORAL
  Filled 2019-11-21: qty 1

## 2019-11-21 MED ORDER — SITAGLIPTIN PHOSPHATE 50 MG PO TABS: 50.0000 mg | ORAL_TABLET | Freq: Every day | ORAL | 0 refills | Status: DC

## 2019-11-21 NOTE — Plan of Care (Signed)
  Problem: Education: Goal: Knowledge of General Education information will improve Description: Including pain rating scale, medication(s)/side effects and non-pharmacologic comfort measures Outcome: Progressing   Problem: Clinical Measurements: Goal: Diagnostic test results will improve Outcome: Progressing   Problem: Activity: Goal: Risk for activity intolerance will decrease Outcome: Progressing   Problem: Coping: Goal: Level of anxiety will decrease Outcome: Progressing   Problem: Pain Managment: Goal: General experience of comfort will improve Outcome: Progressing   Problem: Safety: Goal: Ability to remain free from injury will improve Outcome: Progressing   Elesa Hacker, RN

## 2019-11-21 NOTE — Discharge Instructions (Signed)

## 2019-11-21 NOTE — TOC Transition Note (Signed)
Transition of Care [facility]) - CM/SW Discharge Note   Patient Details  Name: NAVEYA PESHEK MRN: [id number] Date of Birth: 12-11-1951  Transition of Care [facility]) CM/SW Contact:  Bartholomew Crews, RN Phone Number: [phone] 11/21/2019, 3:07 PM   Clinical Narrative:    Patient to transition home today. HH orders placed. [facility] notified of transition to home. Patient advised NCM earlier today that her son would provide transportation home. No further TOC needs identified.    Final next level of care: Home w Home Health Services Barriers to Discharge: No Barriers Identified   Patient Goals and CMS Choice Patient states their goals for this hospitalization and ongoing recovery are:: return home with her son CMS Medicare.gov Compare Post Acute Care list provided to:: Patient Choice offered to / list presented to : Patient  Discharge Placement                       Discharge Plan and Services In-house Referral: NA Discharge Planning Services: CM Consult Post Acute Care Choice: Home Health          DME Arranged: N/A DME Agency: NA       HH Arranged: RN, PT [place] Agency: [place] Date [place]: 11/21/19 Time [place]: [id number] Representative spoke with at [place]: [place] ([place]) Interventions     Readmission Risk Interventions No flowsheet data found.

## 2019-11-21 NOTE — Discharge Summary (Signed)
Physician Discharge Summary  Angela Hays [id number] DOB: 1951-11-22 DOA: 11/18/2019  PCP: Maury Dus, MD  Admit date: 11/18/2019 Discharge date: 11/21/2019  Admitted From: Home Disposition:  Home  Recommendations for Outpatient Follow-up:  1. Follow up with PCP in 1-2 weeks 2. Follow up with Nephrology in 2 weeks  Home Health:PT, RN   Discharge Condition:Improved CODE STATUS:Full Diet recommendation: Diabetic   Brief/Interim Summary: 68 y.o.femalewith medical history significant ofCVA with residual left-sided weakness, wheelchair-bound, CKD 3, insulin-dependent diabetes mellitus, hypertension presenting to the ED via EMS for evaluation of hypoglycemia. CBG 48 per EMS. She was given 15 g of D10 in route to the ED. EKG with hyperacute T waves concerning for hyperkalemia and was given 1 g calcium chloride and 50 mEq of sodium bicarb.  Patient states she has not been eating for the past few days as she does not like the taste of the food her son gives her. She is upset that her son does not allow her to eat butter and rice. States he tells her that she is not drinking enough fluids. States her son has continued to give her insulin for the past few days. She is not sure what dose she has been getting. She is not sure what other medication she takes as her son manages them. Denies nausea, vomiting, abdominal pain, or diarrhea. Denies chest pain or shortness of breath. Patient has no other complaints.  ED Course:Afebrile no leukocytosis. Blood glucose 133. Potassium 8.4.EKG with hyperacute T waves and QRS widening. Bicarb 19. BUN 121, creatinine 2.3. Creatinine was 1.3 on 09/14/2019. TSH normal. Mag normal. VBG with pH 7.30. CK pending. UA pending. Urine protein to creatinine ratio pending. Urine sodium pending. Rapid SARS-CoV-2 antigen test negative, PCR test pending. Patient received albuterol MDI, NovoLog15 units, calcium gluconate 3 g, 1 amp D50, and IV  Lasix 40 mg. Received 1 L normal saline bolus. She was started on D10 infusion. Patient was seen by nephrology and plan is to undergo urgent hemodialysis tonight.Temporary HD cath to be placed by PCCM.  Discharge Diagnoses:  Principal Problem:   Hyperkalemia Active Problems:   DM (diabetes mellitus), type 2 with peripheral vascular complications (HCC)   AKI (acute kidney injury) ([place])   HTN (hypertension)   Hypoglycemia due to insulin   AKI on CKD 3, severe hyperkalemia withacuteEKG changes BUN 121, creatinine 2.3. Creatinine was 1.3 on 09/14/2019. Bicarb 19. Suspect prerenal due to dehydration/decreased p.o. intake and home diuretic use.  -Presenting labs showing severe hyperkalemia with potassium 8.4. EKG with hyperacute T waves and QRS widening. Patient only takes potassium 20 mEq daily at home. Patient received medical treatment for hyperkalemia including albuterol, insulin, dextrose, calcium gluconate, and IV Lasix.  -Nephrology consulted and pt ultimately required emergent HD on 11/24 -Potassium normalized  Hypoglycemia in the setting of insulin-dependent diabetes mellitus -Patient has continued toreceiveinsulin at home despite not eating for the past few days.CBG in the 40s by EMS and was started on D10 infusion. -Hold home insulin  -Required D10 fluids, now on NS -A1c of 6.5 -Consider trial of januvia, have prescribed  Hypertension -Noted to be normotensive at time of presentation -Recently mildly hypotensive -BP now trending higher today.  -Pt on multiple BP meds prior to admit. Will continue on labetalol monotherapy -Would have PCP continue to titrate BP meds as needed -Recommendation to NOT take ACEI or ARB per Nephrology  History of CVA -Held home Plavix to the risk of bleeding for temporary HD catheter at time  of presentation -Resume plavix  Discharge Instructions   Allergies as of 11/21/2019   No Known Allergies     Medication List     STOP taking these medications   amLODipine 5 MG tablet Commonly known as: NORVASC   Basaglar KwikPen 100 UNIT/ML Sopn   cloNIDine 0.2 MG tablet Commonly known as: CATAPRES   doxazosin 4 MG tablet Commonly known as: CARDURA   furosemide 40 MG tablet Commonly known as: LASIX   Klor-Con M20 20 MEQ tablet Generic drug: potassium chloride SA   mupirocin cream 2 % Commonly known as: BACTROBAN   polyethylene glycol 17 g packet Commonly known as: MIRALAX / GLYCOLAX     TAKE these medications   clopidogrel 75 MG tablet Commonly known as: PLAVIX Take 75 mg by mouth daily.   labetalol 100 MG tablet Commonly known as: NORMODYNE Take 100 mg by mouth 2 (two) times daily.   simvastatin 20 MG tablet Commonly known as: ZOCOR Take 20 mg by mouth daily with breakfast.   sitaGLIPtin 50 MG tablet Commonly known as: Januvia Take 1 tablet (50 mg total) by mouth daily.      Follow-up Information    [facility] Follow up.   Specialty: [place] Why: The office will follow up with you to schedule theapy visits Contact information: [place] [address] [phone]        Maury Dus, MD. Schedule an appointment as soon as possible for a visit in 1 week(s).   Specialty: Family Medicine Contact information: [place] [address] [phone]        Claudia Desanctis, MD. Schedule an appointment as soon as possible for a visit.   Specialty: Internal Medicine Contact information: [place] [address] [phone]          No Known Allergies  Consultations:  Nephrology  Procedures/Studies: No results found.  Subjective: Feeling well. Eager to go home  Discharge Exam: Vitals:   11/21/19 1402 11/21/19 1643  BP:  139/75  Pulse: (!) 101   Resp: 19 20  Temp: 98.7 F (37.1 C)   SpO2:     Vitals:   11/21/19 1147 11/21/19 1358 11/21/19 1402 11/21/19 1643  BP: 119/68  127/64  139/75  Pulse: (!) 102  (!) 101   Resp: 17  19 20   Temp: 97.7 F (36.5 C)  98.7 F (37.1 C)   TempSrc:   Oral   SpO2: 100% 100%    Weight:        General: Pt is alert, awake, not in acute distress Cardiovascular: RRR, S1/S2 +, no rubs, no gallops Respiratory: CTA bilaterally, no wheezing, no rhonchi Abdominal: Soft, NT, ND, bowel sounds + Extremities: no edema, no cyanosis   The results of significant diagnostics from this hospitalization (including imaging, microbiology, ancillary and laboratory) are listed below for reference.     Microbiology: Recent Results (from the past 240 hour(s))  SARS Coronavirus 2 by RT PCR (hospital order, performed in [facility] hospital lab) Nasopharyngeal Nasopharyngeal Swab     Status: None   Collection Time: 11/19/19 12:43 AM   Specimen: Nasopharyngeal Swab  Result Value Ref Range Status   SARS Coronavirus 2 NEGATIVE NEGATIVE Final    Comment: (NOTE) If result is NEGATIVE SARS-CoV-2 target nucleic acids are NOT DETECTED. The SARS-CoV-2 RNA is generally detectable in upper and lower  respiratory specimens during the acute phase of infection. The lowest  concentration of SARS-CoV-2 viral copies this assay can detect is 250  copies / mL. A negative result does not preclude SARS-CoV-2 infection  and should not be used as the sole basis for treatment or other  patient management decisions.  A negative result may occur with  improper specimen collection / handling, submission of specimen other  than nasopharyngeal swab, presence of viral mutation(s) within the  areas targeted by this assay, and inadequate number of viral copies  (<250 copies / mL). A negative result must be combined with clinical  observations, patient history, and epidemiological information. If result is POSITIVE SARS-CoV-2 target nucleic acids are DETECTED. The SARS-CoV-2 RNA is generally detectable in upper and lower  respiratory specimens dur ing the acute phase  of infection.  Positive  results are indicative of active infection with SARS-CoV-2.  Clinical  correlation with patient history and other diagnostic information is  necessary to determine patient infection status.  Positive results do  not rule out bacterial infection or co-infection with other viruses. If result is PRESUMPTIVE POSTIVE SARS-CoV-2 nucleic acids MAY BE PRESENT.   A presumptive positive result was obtained on the submitted specimen  and confirmed on repeat testing.  While 2019 novel coronavirus  (SARS-CoV-2) nucleic acids may be present in the submitted sample  additional confirmatory testing may be necessary for epidemiological  and / or clinical management purposes  to differentiate between  SARS-CoV-2 and other Sarbecovirus currently known to infect humans.  If clinically indicated additional testing with an alternate test  methodology [phone]) is advised. The SARS-CoV-2 RNA is generally  detectable in upper and lower respiratory sp ecimens during the acute  phase of infection. The expected result is Negative. Fact Sheet for Patients:  StrictlyIdeas.no Fact Sheet for Healthcare Providers: BankingDealers.co.za This test is not yet approved or cleared by the [place] FDA and has been authorized for detection and/or diagnosis of SARS-CoV-2 by FDA under an Emergency Use Authorization (EUA).  This EUA will remain in effect (meaning this test can be used) for the duration of the COVID-19 declaration under Section 564(b)(1) of the Act, 21 U.S.C. section 360bbb-3(b)(1), unless the authorization is terminated or revoked sooner. Performed at [facility], [place] [address] [place], [place] [address]   MRSA PCR Screening     Status: None   Collection Time: 11/19/19  5:59 AM   Specimen: Nasopharyngeal  Result Value Ref Range Status   MRSA by PCR NEGATIVE NEGATIVE Final    Comment:        The GeneXpert MRSA Assay  (FDA approved for NASAL specimens only), is one component of a comprehensive MRSA colonization surveillance program. It is not intended to diagnose MRSA infection nor to guide or monitor treatment for MRSA infections. Performed at [facility], [place] [address] [place], [place] [address]      Labs: BNP (last 3 results) Recent Labs    06/16/19 1458  BNP 0000000*   Basic Metabolic Panel: Recent Labs  Lab 11/18/19 2135 11/18/19 2145 11/19/19 0246 11/19/19 0352 11/19/19 1225 11/20/19 0339 11/21/19 0311  NA 133* 131*  131* 136  --  133* 133* 138  K >7.5* 8.4*  8.4* 3.9 3.3* 4.6 4.1 3.3*  CL 106 111 101  --  99 101 107  CO2 17*  --  23  --  23 20* 21*  GLUCOSE 158* 152* 100*  --  232* 88 115*  BUN 121* 132* 60*  --  32* 33* 41*  CREATININE  2.37* 2.20* 1.31*  --  1.38* 1.83* 2.09*  CALCIUM 11.8*  --  9.9  --  8.5* 7.8* 7.7*  MG 2.2  --   --   --   --   --   --    Liver Function Tests: Recent Labs  Lab 11/18/19 2135 11/21/19 0311  AST 20 18  ALT 17 14  ALKPHOS 98 77  BILITOT 1.0 0.5  PROT 6.9 4.9*  ALBUMIN 3.8 2.6*   No results for input(s): LIPASE, AMYLASE in the last 168 hours. No results for input(s): AMMONIA in the last 168 hours. CBC: Recent Labs  Lab 11/18/19 2135 11/18/19 2145 11/20/19 0339 11/21/19 0311  WBC 9.8  --  8.1 7.8  NEUTROABS 8.4*  --   --   --   HGB 15.6* 14.6  15.3* 11.6* 10.9*  HCT 46.8* 43.0  45.0 35.3* 32.5*  MCV 89.7  --  89.6 89.0  PLT 271  --  208 179   Cardiac Enzymes: Recent Labs  Lab 11/19/19 0352  CKTOTAL 74   BNP: Invalid input(s): POCBNP CBG: Recent Labs  Lab 11/21/19 0014 11/21/19 0352 11/21/19 0749 11/21/19 1144 11/21/19 1555  GLUCAP 158* 97 99 160* 163*   D-Dimer No results for input(s): DDIMER in the last 72 hours. Hgb A1c Recent Labs    11/19/19 0314  HGBA1C 6.5*   Lipid Profile No results for input(s): CHOL, HDL, LDLCALC, TRIG, CHOLHDL, LDLDIRECT in the last 72 hours. Thyroid function  studies Recent Labs    11/18/19 2133  TSH 2.728   Anemia work up No results for input(s): VITAMINB12, FOLATE, FERRITIN, TIBC, IRON, RETICCTPCT in the last 72 hours. Urinalysis    Component Value Date/Time   COLORURINE STRAW (A) 11/19/2019 0048   APPEARANCEUR CLEAR 11/19/2019 0048   LABSPEC 1.009 11/19/2019 0048   PHURINE 7.0 11/19/2019 0048   GLUCOSEU 50 (A) 11/19/2019 0048   HGBUR NEGATIVE 11/19/2019 0048   BILIRUBINUR NEGATIVE 11/19/2019 0048   KETONESUR 5 (A) 11/19/2019 0048   PROTEINUR NEGATIVE 11/19/2019 0048   UROBILINOGEN 1.0 12/05/2007 1244   NITRITE NEGATIVE 11/19/2019 0048   LEUKOCYTESUR NEGATIVE 11/19/2019 0048   Sepsis Labs Invalid input(s): PROCALCITONIN,  WBC,  LACTICIDVEN Microbiology Recent Results (from the past 240 hour(s))  SARS Coronavirus 2 by RT PCR (hospital order, performed in [place] hospital lab) Nasopharyngeal Nasopharyngeal Swab     Status: None   Collection Time: 11/19/19 12:43 AM   Specimen: Nasopharyngeal Swab  Result Value Ref Range Status   SARS Coronavirus 2 NEGATIVE NEGATIVE Final    Comment: (NOTE) If result is NEGATIVE SARS-CoV-2 target nucleic acids are NOT DETECTED. The SARS-CoV-2 RNA is generally detectable in upper and lower  respiratory specimens during the acute phase of infection. The lowest  concentration of SARS-CoV-2 viral copies this assay can detect is 250  copies / mL. A negative result does not preclude SARS-CoV-2 infection  and should not be used as the sole basis for treatment or other  patient management decisions.  A negative result may occur with  improper specimen collection / handling, submission of specimen other  than nasopharyngeal swab, presence of viral mutation(s) within the  areas targeted by this assay, and inadequate number of viral copies  (<250 copies / mL). A negative result must be combined with clinical  observations, patient history, and epidemiological information. If result is  POSITIVE SARS-CoV-2 target nucleic acids are DETECTED. The SARS-CoV-2 RNA is generally detectable in upper and lower  respiratory specimens  dur ing the acute phase of infection.  Positive  results are indicative of active infection with SARS-CoV-2.  Clinical  correlation with patient history and other diagnostic information is  necessary to determine patient infection status.  Positive results do  not rule out bacterial infection or co-infection with other viruses. If result is PRESUMPTIVE POSTIVE SARS-CoV-2 nucleic acids MAY BE PRESENT.   A presumptive positive result was obtained on the submitted specimen  and confirmed on repeat testing.  While 2019 novel coronavirus  (SARS-CoV-2) nucleic acids may be present in the submitted sample  additional confirmatory testing may be necessary for epidemiological  and / or clinical management purposes  to differentiate between  SARS-CoV-2 and other Sarbecovirus currently known to infect humans.  If clinically indicated additional testing with an alternate test  methodology [phone]) is advised. The SARS-CoV-2 RNA is generally  detectable in upper and lower respiratory sp ecimens during the acute  phase of infection. The expected result is Negative. Fact Sheet for Patients:  StrictlyIdeas.no Fact Sheet for Healthcare Providers: BankingDealers.co.za This test is not yet approved or cleared by the [place] FDA and has been authorized for detection and/or diagnosis of SARS-CoV-2 by FDA under an Emergency Use Authorization (EUA).  This EUA will remain in effect (meaning this test can be used) for the duration of the COVID-19 declaration under Section 564(b)(1) of the Act, 21 U.S.C. section 360bbb-3(b)(1), unless the authorization is terminated or revoked sooner. Performed at [facility], [place] [address], [place], [place] [address]   MRSA PCR Screening     Status: None   Collection Time:  11/19/19  5:59 AM   Specimen: Nasopharyngeal  Result Value Ref Range Status   MRSA by PCR NEGATIVE NEGATIVE Final    Comment:        The GeneXpert MRSA Assay (FDA approved for NASAL specimens only), is one component of a comprehensive MRSA colonization surveillance program. It is not intended to diagnose MRSA infection nor to guide or monitor treatment for MRSA infections. Performed at [facility], [place] [address] [place], [place] [address]    Time spent: 72min  SIGNED:   Marylu Lund, MD  [facility] 11/21/2019, 5:29 PM  If 7PM-7AM, please contact night-coverage

## 2019-11-21 NOTE — Progress Notes (Addendum)
[facility] Progress Note  Name: Angela Hays MRN: [id number] DOB: Oct 13, 1951  Chief Complaint:  Fall and malaise   Subjective:  900 mL UOP charted over 11/25.  Blood pressure improved overnight.  She has been on normal saline at 75/hr.  Review of systems:    Denies shortness of breath or chest pain  No n/v No difficulty with urination  ---------- Background on consult:  12F hx/o HTN, DM2, AKI, BL SCr 1.3 to 1.6 c/w CKD3, hx/o CVA with L sided paresis presented after fall from wheelchair and now with resultant and malaise.  With EMS was hypoglycemic.  EKG suggestive of hyperK and EMS gave Ca/BIcarb.  In ED workup found to have K > 7.5 and iStat K 8.4, 8.5.  EKG with peaked Ts and widened QRS. Given 1gm CaGluc, 5u insulin, ALb MDI, lasix 40, IVF bolus.  ALso with AKI, SCR 3.4 and BUN 132.  Pt is poor historian. Takes 20 mEq KCl/d, lasix.  Denies taking extra pills. No change to diet.  No recent ABX.  She is unclear if taking any NSAIDs.  No sig LEE, No SOB/cough/CP.   covid negative    Intake/Output Summary (Last 24 hours) at 11/21/2019 1149 Last data filed at 11/21/2019 0830 Gross per 24 hour  Intake 1154.74 ml  Output 900 ml  Net 254.74 ml    Vitals:  Vitals:   11/20/19 1601 11/20/19 2015 11/21/19 0353 11/21/19 1147  BP:  104/63 122/62 119/68  Pulse: 98   (!) 102  Resp: 20   17  Temp: 98.4 F (36.9 C) 98.7 F (37.1 C) 98.7 F (37.1 C) 97.7 F (36.5 C)  TempSrc: Oral Oral Oral   SpO2: 100% 97% 96% 100%  Weight:   59 kg      Physical Exam:   General elderly female in bed in no acute distress   HEENT normocephalic atraumatic extraocular movements intact sclera anicteric Neck supple trachea midline Lungs clear to auscultation bilaterally normal work of breathing at rest  Heart regular rate and rhythm no rubs or gallops appreciated Abdomen soft nontender nondistended Extremities no edema  Psych normal mood and affect  Access: bandaged overlying prior  right femoral nontunneled dialysis catheter which has been removed  Medications reviewed   Labs:  BMP Latest Ref Rng & Units 11/21/2019 11/20/2019 11/19/2019  Glucose 70 - 99 mg/dL 115(H) 88 232(H)  BUN 8 - 23 mg/dL 41(H) 33(H) 32(H)  Creatinine 0.44 - 1.00 mg/dL 2.09(H) 1.83(H) 1.38(H)  Sodium 135 - 145 mmol/L 138 133(L) 133(L)  Potassium 3.5 - 5.1 mmol/L 3.3(L) 4.1 4.6  Chloride 98 - 111 mmol/L 107 101 99  CO2 22 - 32 mmol/L 21(L) 20(L) 23  Calcium 8.9 - 10.3 mg/dL 7.7(L) 7.8(L) 8.5(L)     Assessment/Plan:   12F with severe hyperkalemia, AoCKD3.  Unclear why, not on obvious culprit meds other than 65mEq/d of K   1. Hyperkalemia, severe, EKG changes, unclear specific cause - is on potassium supplement and in setting of renal failure. CK normal  - s/p HD overnight 11/24 early am after placement of nontunneled catheter per critical care - resolved with HD - Please hold supplemental potassium on discharge  - K low this AM - gently repleting x 1  2. AKI likely hypovolemic / pre-renal as well as ischemic insults with hypotension. Cr 2.37 on admit.   - s/p HD - Continue supportive care  - Please hold lasix now and would not resume on discharge - nontunneled  femoral catheter is out  - would avoid ACE inhibitor or ARB  - creatinine continues to slowly rise - her baseline may actually be around 2?   - Overall renal function appears stable - will set up renal follow-up at [facility] in two weeks for after discharge  3. CKD stage III - Baseline 1.4 - 1.7.  Her labs have improved over the course of her past hospitalizations to the lower end of the baseline and she had presented around 1.8 - 2.0 - possibly with improvement after medication titration?  May actually be CKD stage IV.  On chart review she has had a BUN over 100 on multiple past admissions.  Stopping lasix.  4. DM type 2 presenting with hypoglycemia  5. HTN she is hypotensive - off medications with hypotension.  Note that she is  charted as having requirement of multiple anti-hypertensives including clonidine 0.2 mg listed as four times a day on med rec. Unsure of accuracy of meds.  Upon discharge she would benefit from close PCP follow-up to adjust medications as they have been off this admission.  Would not start an ACE or ARB.  6. Hx/o CVA and L sided paresis - noted    Claudia Desanctis, MD 11/21/2019 11:49 AM

## 2019-11-21 NOTE — TOC Initial Note (Signed)
Transition of Care [facility]) - Initial/Assessment Note    Patient Details  Name: Angela Hays MRN: [id number] Date of Birth: Sep 26, 1951  Transition of Care [facility]) CM/SW Contact:    Bartholomew Crews, RN Phone Number: [phone] 11/21/2019, 12:56 PM  Clinical Narrative:                 NCM acknowledging consult for [facility]. Spoke with patient at the bedside. PTA home with son. States he takes her to any medical appointments needed. No concerns about medications. Discussed recommendations for [facility] PT. Patient agreeable . Offered choice. Referral accepted by [facility]. Patient witll need [place] orders for PT with Face to Face prior to discharge. TOC following for transition needs.   Expected Discharge Plan: [place] Barriers to Discharge: Continued Medical Work up   Patient Goals and CMS Choice Patient states their goals for this hospitalization and ongoing recovery are:: return home with her son CMS Medicare.gov Compare Post Acute Care list provided to:: Patient Choice offered to / list presented to : Patient  Expected Discharge Plan and Services Expected Discharge Plan: [place] In-house Referral: NA Discharge Planning Services: CM Consult Post Acute Care Choice: [place] arrangements for the past 2 months: Single Family Home                 DME Arranged: N/A DME Agency: NA       HH Arranged: PT HH Agency: [place] Date [place]: 11/21/19 Time [employer] Agency Contacted: 38 Representative spoke with at [place]: Tommi Rumps  Prior Living Arrangements/Services Living arrangements for the past 2 months: [place] Lives with:: Self, Adult Children Patient language and need for interpreter reviewed:: Yes Do you feel safe going back to the place where you live?: Yes      Need for Family Participation in Patient Care: Yes (Comment) Care giver support system in place?: Yes (comment)   Criminal Activity/Legal Involvement  Pertinent to Current Situation/Hospitalization: No - Comment as needed  Activities of Daily Living Home Assistive Devices/Equipment: [place]ford Rile (specify type) ADL Screening (condition at time of admission) Patient's cognitive ability adequate to safely complete daily activities?: No Is the patient deaf or have difficulty hearing?: Yes Does the patient have difficulty seeing, even when wearing glasses/contacts?: No Does the patient have difficulty concentrating, remembering, or making decisions?: Yes Patient able to express need for assistance with ADLs?: Yes Does the patient have difficulty dressing or bathing?: No Independently performs ADLs?: Yes (appropriate for developmental age) Does the patient have difficulty walking or climbing stairs?: Yes Weakness of Legs: Both Weakness of Arms/Hands: None  Permission Sought/Granted                  Emotional Assessment Appearance:: Appears stated age Attitude/Demeanor/Rapport: Engaged Affect (typically observed): Accepting Orientation: : Oriented to Self, Oriented to Place, Oriented to  Time, Oriented to Situation Alcohol / Substance Use: Not Applicable Psych Involvement: No (comment)  Admission diagnosis:  Hyperkalemia [E87.5] Hypoglycemia [E16.2] Patient Active Problem List   Diagnosis Date Noted  . Hypoglycemia due to insulin 11/19/2019  . Abdominal pain 09/14/2019  . Constipation 09/13/2019  . Colitis 09/13/2019  . HTN (hypertension) 09/13/2019  . Weakness   . Palliative care by specialist   . Goals of care, counseling/discussion   . Pressure injury of skin 06/17/2019  . Hyperkalemia 06/16/2019  . Renal insufficiency 06/16/2019  . Hyponatremia 06/16/2019  . Metabolic acidosis [id number]  . Chronic  diastolic CHF (congestive heart failure) ([place]) 06/16/2019  . Hypotension due to hypovolemia 06/16/2019  . Elevated troponin 06/16/2019  . Candidal intertrigo 06/16/2019  . AKI (acute kidney injury) ([place])   . ARF (acute renal  failure) ([place]) 05/11/2016  . Leg ulcer ([place]) 05/11/2016  . Cellulitis and abscess of leg 05/06/2016  . Sepsis ([place]) 01/05/2016  . Pressure ulcer 01/05/2016  . Leg swelling 01/05/2016  . DM (diabetes mellitus), type 2 with peripheral vascular complications ([place]) [id number]  . Hypertensive urgency 01/05/2016   PCP:  Maury Dus, MD Pharmacy:   [place], [place] - [address] [place] [address] Phone: [phone] Fax: [phone]     Social Determinants of Health (SDOH) Interventions    Readmission Risk Interventions No flowsheet data found.

## 2020-05-04 ENCOUNTER — Encounter (HOSPITAL_COMMUNITY): Payer: Self-pay | Admitting: Emergency Medicine

## 2020-05-04 ENCOUNTER — Other Ambulatory Visit: Payer: Self-pay

## 2020-05-04 ENCOUNTER — Inpatient Hospital Stay (HOSPITAL_COMMUNITY)
Admission: EM | Admit: 2020-05-04 | Discharge: 2020-05-08 | DRG: 638 | Disposition: A | Discharge status: Skilled Nursing Facility | Payer: Medicare Other | Attending: Student | Admitting: Student

## 2020-05-04 DIAGNOSIS — I5032 Chronic diastolic (congestive) heart failure: Secondary | ICD-10-CM | POA: Diagnosis present

## 2020-05-04 DIAGNOSIS — E860 Dehydration: Secondary | ICD-10-CM | POA: Diagnosis not present

## 2020-05-04 DIAGNOSIS — R9431 Abnormal electrocardiogram [ECG] [EKG]: Secondary | ICD-10-CM

## 2020-05-04 DIAGNOSIS — Z7984 Long term (current) use of oral hypoglycemic drugs: Secondary | ICD-10-CM

## 2020-05-04 DIAGNOSIS — Z79899 Other long term (current) drug therapy: Secondary | ICD-10-CM

## 2020-05-04 DIAGNOSIS — E1165 Type 2 diabetes mellitus with hyperglycemia: Secondary | ICD-10-CM | POA: Diagnosis not present

## 2020-05-04 DIAGNOSIS — N179 Acute kidney failure, unspecified: Secondary | ICD-10-CM | POA: Diagnosis present

## 2020-05-04 DIAGNOSIS — E1122 Type 2 diabetes mellitus with diabetic chronic kidney disease: Secondary | ICD-10-CM | POA: Diagnosis present

## 2020-05-04 DIAGNOSIS — R112 Nausea with vomiting, unspecified: Secondary | ICD-10-CM | POA: Diagnosis present

## 2020-05-04 DIAGNOSIS — Z66 Do not resuscitate: Secondary | ICD-10-CM | POA: Diagnosis not present

## 2020-05-04 DIAGNOSIS — L89622 Pressure ulcer of left heel, stage 2: Secondary | ICD-10-CM | POA: Diagnosis present

## 2020-05-04 DIAGNOSIS — M24562 Contracture, left knee: Secondary | ICD-10-CM | POA: Diagnosis present

## 2020-05-04 DIAGNOSIS — Z7902 Long term (current) use of antithrombotics/antiplatelets: Secondary | ICD-10-CM

## 2020-05-04 DIAGNOSIS — M24561 Contracture, right knee: Secondary | ICD-10-CM | POA: Diagnosis present

## 2020-05-04 DIAGNOSIS — R5381 Other malaise: Secondary | ICD-10-CM | POA: Diagnosis present

## 2020-05-04 DIAGNOSIS — E1151 Type 2 diabetes mellitus with diabetic peripheral angiopathy without gangrene: Secondary | ICD-10-CM | POA: Diagnosis present

## 2020-05-04 DIAGNOSIS — Z87891 Personal history of nicotine dependence: Secondary | ICD-10-CM

## 2020-05-04 DIAGNOSIS — N183 Chronic kidney disease, stage 3 unspecified: Secondary | ICD-10-CM

## 2020-05-04 DIAGNOSIS — I447 Left bundle-branch block, unspecified: Secondary | ICD-10-CM | POA: Diagnosis present

## 2020-05-04 DIAGNOSIS — E871 Hypo-osmolality and hyponatremia: Secondary | ICD-10-CM | POA: Diagnosis present

## 2020-05-04 DIAGNOSIS — I130 Hypertensive heart and chronic kidney disease with heart failure and stage 1 through stage 4 chronic kidney disease, or unspecified chronic kidney disease: Secondary | ICD-10-CM | POA: Diagnosis present

## 2020-05-04 DIAGNOSIS — R739 Hyperglycemia, unspecified: Secondary | ICD-10-CM

## 2020-05-04 DIAGNOSIS — I10 Essential (primary) hypertension: Secondary | ICD-10-CM | POA: Diagnosis present

## 2020-05-04 DIAGNOSIS — Z8673 Personal history of transient ischemic attack (TIA), and cerebral infarction without residual deficits: Secondary | ICD-10-CM

## 2020-05-04 DIAGNOSIS — Z993 Dependence on wheelchair: Secondary | ICD-10-CM

## 2020-05-04 DIAGNOSIS — E1100 Type 2 diabetes mellitus with hyperosmolarity without nonketotic hyperglycemic-hyperosmolar coma (NKHHC): Secondary | ICD-10-CM | POA: Diagnosis present

## 2020-05-04 DIAGNOSIS — Z20822 Contact with and (suspected) exposure to covid-19: Secondary | ICD-10-CM | POA: Diagnosis present

## 2020-05-04 DIAGNOSIS — Z91411 Personal history of adult psychological abuse: Secondary | ICD-10-CM

## 2020-05-04 DIAGNOSIS — I69398 Other sequelae of cerebral infarction: Secondary | ICD-10-CM

## 2020-05-04 LAB — CBC
HCT: 47.9 % — ABNORMAL HIGH (ref 36.0–46.0)
Hemoglobin: 15.4 g/dL — ABNORMAL HIGH (ref 12.0–15.0)
MCH: 28.1 pg (ref 26.0–34.0)
MCHC: 32.2 g/dL (ref 30.0–36.0)
MCV: 87.2 fL (ref 80.0–100.0)
Platelets: 237 10*3/uL (ref 150–400)
RBC: 5.49 MIL/uL — ABNORMAL HIGH (ref 3.87–5.11)
RDW: 13.6 % (ref 11.5–15.5)
WBC: 7.8 10*3/uL (ref 4.0–10.5)
nRBC: 0 % (ref 0.0–0.2)

## 2020-05-04 LAB — COMPREHENSIVE METABOLIC PANEL
ALT: 15 U/L (ref 0–44)
AST: 18 U/L (ref 15–41)
Albumin: 3.9 g/dL (ref 3.5–5.0)
Alkaline Phosphatase: 161 U/L — ABNORMAL HIGH (ref 38–126)
Anion gap: 18 — ABNORMAL HIGH (ref 5–15)
BUN: 46 mg/dL — ABNORMAL HIGH (ref 8–23)
CO2: 23 mmol/L (ref 22–32)
Calcium: 10 mg/dL (ref 8.9–10.3)
Chloride: 89 mmol/L — ABNORMAL LOW (ref 98–111)
Creatinine, Ser: 1.71 mg/dL — ABNORMAL HIGH (ref 0.44–1.00)
GFR calc Af Amer: 34 mL/min — ABNORMAL LOW (ref >60)
GFR calc non Af Amer: 30 mL/min — ABNORMAL LOW (ref >60)
Glucose, Bld: 778 mg/dL (ref 70–99)
Potassium: 4 mmol/L (ref 3.5–5.1)
Sodium: 130 mmol/L — ABNORMAL LOW (ref 135–145)
Total Bilirubin: 1 mg/dL (ref 0.3–1.2)
Total Protein: 7.4 g/dL (ref 6.5–8.1)

## 2020-05-04 LAB — CBG MONITORING, ED: Glucose-Capillary: >600 mg/dL (ref 70–99)
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
## 2020-05-05 ENCOUNTER — Encounter (HOSPITAL_COMMUNITY): Payer: Self-pay | Admitting: Internal Medicine

## 2020-05-05 ENCOUNTER — Inpatient Hospital Stay (HOSPITAL_COMMUNITY): Payer: Medicare Other

## 2020-05-05 DIAGNOSIS — E871 Hypo-osmolality and hyponatremia: Secondary | ICD-10-CM | POA: Diagnosis present

## 2020-05-05 DIAGNOSIS — R5381 Other malaise: Secondary | ICD-10-CM | POA: Diagnosis present

## 2020-05-05 DIAGNOSIS — Z7984 Long term (current) use of oral hypoglycemic drugs: Secondary | ICD-10-CM | POA: Diagnosis not present

## 2020-05-05 DIAGNOSIS — E1100 Type 2 diabetes mellitus with hyperosmolarity without nonketotic hyperglycemic-hyperosmolar coma (NKHHC): Secondary | ICD-10-CM | POA: Diagnosis present

## 2020-05-05 DIAGNOSIS — R9431 Abnormal electrocardiogram [ECG] [EKG]: Secondary | ICD-10-CM

## 2020-05-05 DIAGNOSIS — M24561 Contracture, right knee: Secondary | ICD-10-CM | POA: Diagnosis present

## 2020-05-05 DIAGNOSIS — Z7902 Long term (current) use of antithrombotics/antiplatelets: Secondary | ICD-10-CM | POA: Diagnosis not present

## 2020-05-05 DIAGNOSIS — M24562 Contracture, left knee: Secondary | ICD-10-CM | POA: Diagnosis present

## 2020-05-05 DIAGNOSIS — Z91411 Personal history of adult psychological abuse: Secondary | ICD-10-CM | POA: Diagnosis not present

## 2020-05-05 DIAGNOSIS — Z66 Do not resuscitate: Secondary | ICD-10-CM | POA: Diagnosis not present

## 2020-05-05 DIAGNOSIS — Z87891 Personal history of nicotine dependence: Secondary | ICD-10-CM | POA: Diagnosis not present

## 2020-05-05 DIAGNOSIS — N179 Acute kidney failure, unspecified: Secondary | ICD-10-CM | POA: Diagnosis present

## 2020-05-05 DIAGNOSIS — I5032 Chronic diastolic (congestive) heart failure: Secondary | ICD-10-CM | POA: Diagnosis present

## 2020-05-05 DIAGNOSIS — E860 Dehydration: Secondary | ICD-10-CM | POA: Diagnosis present

## 2020-05-05 DIAGNOSIS — N183 Chronic kidney disease, stage 3 unspecified: Secondary | ICD-10-CM | POA: Diagnosis present

## 2020-05-05 DIAGNOSIS — Z20822 Contact with and (suspected) exposure to covid-19: Secondary | ICD-10-CM | POA: Diagnosis present

## 2020-05-05 DIAGNOSIS — I130 Hypertensive heart and chronic kidney disease with heart failure and stage 1 through stage 4 chronic kidney disease, or unspecified chronic kidney disease: Secondary | ICD-10-CM | POA: Diagnosis present

## 2020-05-05 DIAGNOSIS — I69398 Other sequelae of cerebral infarction: Secondary | ICD-10-CM | POA: Diagnosis not present

## 2020-05-05 DIAGNOSIS — E1122 Type 2 diabetes mellitus with diabetic chronic kidney disease: Secondary | ICD-10-CM | POA: Diagnosis present

## 2020-05-05 DIAGNOSIS — E1165 Type 2 diabetes mellitus with hyperglycemia: Principal | ICD-10-CM

## 2020-05-05 DIAGNOSIS — I447 Left bundle-branch block, unspecified: Secondary | ICD-10-CM | POA: Diagnosis present

## 2020-05-05 DIAGNOSIS — L89622 Pressure ulcer of left heel, stage 2: Secondary | ICD-10-CM | POA: Diagnosis present

## 2020-05-05 DIAGNOSIS — E1151 Type 2 diabetes mellitus with diabetic peripheral angiopathy without gangrene: Secondary | ICD-10-CM | POA: Diagnosis present

## 2020-05-05 DIAGNOSIS — Z79899 Other long term (current) drug therapy: Secondary | ICD-10-CM | POA: Diagnosis not present

## 2020-05-05 DIAGNOSIS — Z993 Dependence on wheelchair: Secondary | ICD-10-CM | POA: Diagnosis not present

## 2020-05-05 DIAGNOSIS — R112 Nausea with vomiting, unspecified: Secondary | ICD-10-CM | POA: Diagnosis present

## 2020-05-05 DIAGNOSIS — Z8673 Personal history of transient ischemic attack (TIA), and cerebral infarction without residual deficits: Secondary | ICD-10-CM

## 2020-05-05 LAB — BASIC METABOLIC PANEL
Anion gap: 15 (ref 5–15)
Anion gap: 15 (ref 5–15)
Anion gap: 9 (ref 5–15)
Anion gap: 14 (ref 5–15)
BUN: 44 mg/dL — ABNORMAL HIGH (ref 8–23)
BUN: 44 mg/dL — ABNORMAL HIGH (ref 8–23)
BUN: 31 mg/dL — ABNORMAL HIGH (ref 8–23)
BUN: 36 mg/dL — ABNORMAL HIGH (ref 8–23)
CO2: 22 mmol/L (ref 22–32)
CO2: 22 mmol/L (ref 22–32)
CO2: 21 mmol/L — ABNORMAL LOW (ref 22–32)
CO2: 24 mmol/L (ref 22–32)
Calcium: 9.4 mg/dL (ref 8.9–10.3)
Calcium: 9.4 mg/dL (ref 8.9–10.3)
Calcium: 7.8 mg/dL — ABNORMAL LOW (ref 8.9–10.3)
Calcium: 9.9 mg/dL (ref 8.9–10.3)
Chloride: 96 mmol/L — ABNORMAL LOW (ref 98–111)
Chloride: 96 mmol/L — ABNORMAL LOW (ref 98–111)
Chloride: 96 mmol/L — ABNORMAL LOW (ref 98–111)
Chloride: 98 mmol/L (ref 98–111)
Creatinine, Ser: 1.53 mg/dL — ABNORMAL HIGH (ref 0.44–1.00)
Creatinine, Ser: 1.53 mg/dL — ABNORMAL HIGH (ref 0.44–1.00)
Creatinine, Ser: 1.12 mg/dL — ABNORMAL HIGH (ref 0.44–1.00)
Creatinine, Ser: 1.25 mg/dL — ABNORMAL HIGH (ref 0.44–1.00)
GFR calc Af Amer: 39 mL/min — ABNORMAL LOW (ref >60)
GFR calc Af Amer: 39 mL/min — ABNORMAL LOW (ref >60)
GFR calc Af Amer: 57 mL/min — ABNORMAL LOW (ref >60)
GFR calc Af Amer: 50 mL/min — ABNORMAL LOW (ref >60)
GFR calc non Af Amer: 34 mL/min — ABNORMAL LOW (ref >60)
GFR calc non Af Amer: 34 mL/min — ABNORMAL LOW (ref >60)
GFR calc non Af Amer: 49 mL/min — ABNORMAL LOW (ref >60)
GFR calc non Af Amer: 43 mL/min — ABNORMAL LOW (ref >60)
Glucose, Bld: 508 mg/dL (ref 70–99)
Glucose, Bld: 508 mg/dL (ref 70–99)
Glucose, Bld: 959 mg/dL (ref 70–99)
Glucose, Bld: 250 mg/dL — ABNORMAL HIGH (ref 70–99)
Potassium: 3.7 mmol/L (ref 3.5–5.1)
Potassium: 3.7 mmol/L (ref 3.5–5.1)
Potassium: 2.8 mmol/L — ABNORMAL LOW (ref 3.5–5.1)
Potassium: 4.4 mmol/L (ref 3.5–5.1)
Sodium: 133 mmol/L — ABNORMAL LOW (ref 135–145)
Sodium: 133 mmol/L — ABNORMAL LOW (ref 135–145)
Sodium: 126 mmol/L — ABNORMAL LOW (ref 135–145)
Sodium: 136 mmol/L (ref 135–145)

## 2020-05-05 LAB — CBC
HCT: 41.1 % (ref 36.0–46.0)
Hemoglobin: 13.3 g/dL (ref 12.0–15.0)
MCH: 27.7 pg (ref 26.0–34.0)
MCHC: 32.4 g/dL (ref 30.0–36.0)
MCV: 85.6 fL (ref 80.0–100.0)
Platelets: 210 10*3/uL (ref 150–400)
RBC: 4.8 MIL/uL (ref 3.87–5.11)
RDW: 13.2 % (ref 11.5–15.5)
WBC: 7.2 10*3/uL (ref 4.0–10.5)
nRBC: 0 % (ref 0.0–0.2)

## 2020-05-05 LAB — CBG MONITORING, ED
Glucose-Capillary: 543 mg/dL (ref 70–99)
Glucose-Capillary: 493 mg/dL — ABNORMAL HIGH (ref 70–99)
Glucose-Capillary: >600 mg/dL (ref 70–99)
Glucose-Capillary: 409 mg/dL — ABNORMAL HIGH (ref 70–99)
Glucose-Capillary: 339 mg/dL — ABNORMAL HIGH (ref 70–99)
Glucose-Capillary: 391 mg/dL — ABNORMAL HIGH (ref 70–99)
Glucose-Capillary: 287 mg/dL — ABNORMAL HIGH (ref 70–99)
Glucose-Capillary: 240 mg/dL — ABNORMAL HIGH (ref 70–99)
Glucose-Capillary: 174 mg/dL — ABNORMAL HIGH (ref 70–99)
Glucose-Capillary: 175 mg/dL — ABNORMAL HIGH (ref 70–99)
Glucose-Capillary: 160 mg/dL — ABNORMAL HIGH (ref 70–99)
Glucose-Capillary: 143 mg/dL — ABNORMAL HIGH (ref 70–99)
Glucose-Capillary: 182 mg/dL — ABNORMAL HIGH (ref 70–99)
Glucose-Capillary: 171 mg/dL — ABNORMAL HIGH (ref 70–99)
Glucose-Capillary: 216 mg/dL — ABNORMAL HIGH (ref 70–99)
Glucose-Capillary: 224 mg/dL — ABNORMAL HIGH (ref 70–99)
Glucose-Capillary: 231 mg/dL — ABNORMAL HIGH (ref 70–99)
Glucose-Capillary: 151 mg/dL — ABNORMAL HIGH (ref 70–99)

## 2020-05-05 LAB — URINALYSIS, ROUTINE W REFLEX MICROSCOPIC
Bacteria, UA: NONE SEEN
Bilirubin Urine: NEGATIVE
Glucose, UA: >=500 mg/dL — AB
Hgb urine dipstick: NEGATIVE
Ketones, ur: NEGATIVE mg/dL
Leukocytes,Ua: NEGATIVE
Nitrite: NEGATIVE
Protein, ur: NEGATIVE mg/dL
Specific Gravity, Urine: 1.014 (ref 1.005–1.030)
pH: 6 (ref 5.0–8.0)

## 2020-05-05 LAB — ECHOCARDIOGRAM COMPLETE
Height: 63 in
Weight: 2160 oz

## 2020-05-05 LAB — TROPONIN I (HIGH SENSITIVITY)
Troponin I (High Sensitivity): 30 ng/L — ABNORMAL HIGH (ref <18)
Troponin I (High Sensitivity): 28 ng/L — ABNORMAL HIGH (ref <18)

## 2020-05-05 LAB — SARS CORONAVIRUS 2 (TAT 6-24 HRS): SARS Coronavirus 2: NEGATIVE

## 2020-05-05 LAB — GLUCOSE, CAPILLARY
Glucose-Capillary: 127 mg/dL — ABNORMAL HIGH (ref 70–99)
Glucose-Capillary: 249 mg/dL — ABNORMAL HIGH (ref 70–99)

## 2020-05-05 MED ORDER — SIMVASTATIN 20 MG PO TABS
20.0000 mg | ORAL_TABLET | Freq: Every day | ORAL | Status: DC
  Administered 2020-05-05 – 2020-05-08 (×4): 20 mg via ORAL
  Filled 2020-05-05 (×4): qty 1

## 2020-05-05 MED ORDER — DEXTROSE 50 % IV SOLN
0.0000 mL | INTRAVENOUS | Status: DC | PRN
Start: 2020-05-05 — End: 2020-05-05

## 2020-05-05 MED ORDER — CLOPIDOGREL BISULFATE 75 MG PO TABS
75.0000 mg | ORAL_TABLET | Freq: Every day | ORAL | Status: DC
  Administered 2020-05-05 – 2020-05-08 (×4): 75 mg via ORAL
  Filled 2020-05-05 (×4): qty 1

## 2020-05-05 MED ORDER — SODIUM CHLORIDE 0.9 % IV BOLUS (SEPSIS)
1000.0000 mL | Freq: Once | INTRAVENOUS | Status: AC
  Administered 2020-05-05: 1000 mL via INTRAVENOUS

## 2020-05-05 MED ORDER — INSULIN REGULAR(HUMAN) IN NACL 100-0.9 UT/100ML-% IV SOLN: INTRAVENOUS | Status: DC

## 2020-05-05 MED ORDER — SODIUM CHLORIDE 0.9 % IV SOLN: INTRAVENOUS | Status: DC

## 2020-05-05 MED ORDER — INSULIN ASPART 100 UNIT/ML ~~LOC~~ SOLN
0.0000 [IU] | Freq: Every day | SUBCUTANEOUS | Status: DC
  Administered 2020-05-05: 2 [IU] via SUBCUTANEOUS

## 2020-05-05 MED ORDER — INSULIN REGULAR(HUMAN) IN NACL 100-0.9 UT/100ML-% IV SOLN
INTRAVENOUS | Status: DC
  Administered 2020-05-05: 8 [IU]/h via INTRAVENOUS
  Filled 2020-05-05: qty 100

## 2020-05-05 MED ORDER — ACETAMINOPHEN 650 MG RE SUPP: 650.0000 mg | Freq: Four times a day (QID) | RECTAL | Status: DC | PRN

## 2020-05-05 MED ORDER — ACETAMINOPHEN 325 MG PO TABS
650.0000 mg | ORAL_TABLET | Freq: Four times a day (QID) | ORAL | Status: DC | PRN
  Administered 2020-05-06 – 2020-05-08 (×2): 650 mg via ORAL
  Filled 2020-05-05 (×2): qty 2

## 2020-05-05 MED ORDER — DEXTROSE-NACL 5-0.45 % IV SOLN: INTRAVENOUS | Status: DC

## 2020-05-05 MED ORDER — HEPARIN SODIUM (PORCINE) 5000 UNIT/ML IJ SOLN
5000.0000 [IU] | Freq: Three times a day (TID) | INTRAMUSCULAR | Status: DC
  Administered 2020-05-05 – 2020-05-08 (×9): 5000 [IU] via SUBCUTANEOUS
  Filled 2020-05-05 (×9): qty 1

## 2020-05-05 MED ORDER — INSULIN ASPART 100 UNIT/ML ~~LOC~~ SOLN
0.0000 [IU] | Freq: Three times a day (TID) | SUBCUTANEOUS | Status: DC
  Administered 2020-05-05: 1 [IU] via SUBCUTANEOUS
  Administered 2020-05-06: 5 [IU] via SUBCUTANEOUS
  Administered 2020-05-06: 2 [IU] via SUBCUTANEOUS

## 2020-05-05 MED ORDER — INSULIN GLARGINE 100 UNIT/ML ~~LOC~~ SOLN
10.0000 [IU] | Freq: Every day | SUBCUTANEOUS | Status: DC
  Administered 2020-05-05 – 2020-05-06 (×2): 10 [IU] via SUBCUTANEOUS
  Filled 2020-05-05 (×3): qty 0.1

## 2020-05-05 MED ORDER — TRAMADOL HCL 50 MG PO TABS
50.0000 mg | ORAL_TABLET | Freq: Two times a day (BID) | ORAL | Status: DC | PRN
  Administered 2020-05-08: 50 mg via ORAL
  Filled 2020-05-05 (×2): qty 1

## 2020-05-05 MED ORDER — LABETALOL HCL 100 MG PO TABS
50.0000 mg | ORAL_TABLET | Freq: Two times a day (BID) | ORAL | Status: DC
  Administered 2020-05-05 – 2020-05-08 (×7): 50 mg via ORAL
  Filled 2020-05-05: qty 0.5
  Filled 2020-05-05 (×5): qty 1
  Filled 2020-05-05: qty 0.5
  Filled 2020-05-05: qty 1

## 2020-05-05 MED ORDER — ONDANSETRON HCL 4 MG PO TABS: 4.0000 mg | ORAL_TABLET | Freq: Four times a day (QID) | ORAL | Status: DC | PRN

## 2020-05-05 MED ORDER — ONDANSETRON HCL 4 MG/2ML IJ SOLN: 4.0000 mg | Freq: Four times a day (QID) | INTRAMUSCULAR | Status: DC | PRN

## 2020-05-05 MED ORDER — DEXTROSE 50 % IV SOLN: 0.0000 mL | INTRAVENOUS | Status: DC | PRN
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
## 2020-05-05 NOTE — Discharge Planning (Signed)
RNCM placed order for PT evaluation.  EDCM to follow for disposition needs.

## 2020-05-05 NOTE — Progress Notes (Signed)
  Echocardiogram 2D Echocardiogram has been performed.  Angela Hays 05/05/2020, 12:21 PM
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
## 2020-05-05 NOTE — ED Notes (Signed)
SDU- NO VISITORS Dinner ordered
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
## 2020-05-05 NOTE — ED Notes (Signed)
Lunch Tray Ordered 

## 2020-05-05 NOTE — Progress Notes (Signed)
Inpatient Diabetes Program Recommendations  AACE/ADA: New Consensus Statement on Inpatient Glycemic Control (2015)  Target Ranges:  Prepandial:   less than 140 mg/dL      Peak postprandial:   less than 180 mg/dL (1-2 hours)      Critically ill patients:  140 - 180 mg/dL   Lab Results  Component Value Date   GLUCAP 175 (H) 05/05/2020   HGBA1C 6.5 (H) 11/19/2019    Review of Glycemic Control Results for Angela Hays, Angela Hays (MRN [id number]) as of 05/05/2020 09:17  Ref. Range 05/05/2020 04:32 05/05/2020 05:31 05/05/2020 06:32 05/05/2020 08:50  Glucose-Capillary Latest Ref Range: 70 - 99 mg/dL 287 (H) 240 (H) 174 (H) 175 (H)   Diabetes history: Type 2 DM Outpatient Diabetes medications: Januvia 100 mg QD, insulin (dosages unknown) Current orders for Inpatient glycemic control: IV insulin  Inpatient Diabetes Program Recommendations:    When ready to transition consider:  -Levemir 10 units 2 hours prior to discontinuation of IV insulin -Novolog 0-6 units TID & HS.  A1C pending.   Thanks, Bronson Curb, MSN, RNC-OB Diabetes Coordinator [phone] (8a-5p)
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
## 2020-05-06 DIAGNOSIS — I10 Essential (primary) hypertension: Secondary | ICD-10-CM

## 2020-05-06 DIAGNOSIS — I447 Left bundle-branch block, unspecified: Secondary | ICD-10-CM

## 2020-05-06 DIAGNOSIS — L89622 Pressure ulcer of left heel, stage 2: Secondary | ICD-10-CM

## 2020-05-06 DIAGNOSIS — R5381 Other malaise: Secondary | ICD-10-CM

## 2020-05-06 DIAGNOSIS — I5032 Chronic diastolic (congestive) heart failure: Secondary | ICD-10-CM

## 2020-05-06 DIAGNOSIS — N183 Chronic kidney disease, stage 3 unspecified: Secondary | ICD-10-CM

## 2020-05-06 DIAGNOSIS — R9431 Abnormal electrocardiogram [ECG] [EKG]: Secondary | ICD-10-CM

## 2020-05-06 DIAGNOSIS — Z0471 Encounter for examination and observation following alleged adult physical abuse: Secondary | ICD-10-CM

## 2020-05-06 DIAGNOSIS — E871 Hypo-osmolality and hyponatremia: Secondary | ICD-10-CM

## 2020-05-06 LAB — CBC
HCT: 41.9 % (ref 36.0–46.0)
Hemoglobin: 13.8 g/dL (ref 12.0–15.0)
MCH: 28.1 pg (ref 26.0–34.0)
MCHC: 32.9 g/dL (ref 30.0–36.0)
MCV: 85.3 fL (ref 80.0–100.0)
Platelets: 237 K/uL (ref 150–400)
RBC: 4.91 MIL/uL (ref 3.87–5.11)
RDW: 13.5 % (ref 11.5–15.5)
WBC: 6.8 K/uL (ref 4.0–10.5)
nRBC: 0 % (ref 0.0–0.2)

## 2020-05-06 LAB — BASIC METABOLIC PANEL
Anion gap: 11 (ref 5–15)
BUN: 26 mg/dL — ABNORMAL HIGH (ref 8–23)
CO2: 23 mmol/L (ref 22–32)
Calcium: 10 mg/dL (ref 8.9–10.3)
Chloride: 99 mmol/L (ref 98–111)
Creatinine, Ser: 1.18 mg/dL — ABNORMAL HIGH (ref 0.44–1.00)
GFR calc Af Amer: 54 mL/min — ABNORMAL LOW (ref >60)
GFR calc non Af Amer: 46 mL/min — ABNORMAL LOW (ref >60)
Glucose, Bld: 250 mg/dL — ABNORMAL HIGH (ref 70–99)
Potassium: 3.3 mmol/L — ABNORMAL LOW (ref 3.5–5.1)
Sodium: 133 mmol/L — ABNORMAL LOW (ref 135–145)

## 2020-05-06 LAB — HEMOGLOBIN A1C
Hgb A1c MFr Bld: >15.5 % — ABNORMAL HIGH (ref 4.8–5.6)
Mean Plasma Glucose: >398 mg/dL

## 2020-05-06 LAB — GLUCOSE, CAPILLARY
Glucose-Capillary: 181 mg/dL — ABNORMAL HIGH (ref 70–99)
Glucose-Capillary: 292 mg/dL — ABNORMAL HIGH (ref 70–99)
Glucose-Capillary: 239 mg/dL — ABNORMAL HIGH (ref 70–99)
Glucose-Capillary: 407 mg/dL — ABNORMAL HIGH (ref 70–99)

## 2020-05-06 LAB — PHOSPHORUS: Phosphorus: 2.2 mg/dL — ABNORMAL LOW (ref 2.5–4.6)

## 2020-05-06 LAB — OSMOLALITY: Osmolality: 295 mosm/kg (ref 275–295)

## 2020-05-06 LAB — MAGNESIUM: Magnesium: 1.7 mg/dL (ref 1.7–2.4)

## 2020-05-06 MED ORDER — CLONIDINE HCL 0.1 MG PO TABS
0.1000 mg | ORAL_TABLET | Freq: Two times a day (BID) | ORAL | Status: DC
  Administered 2020-05-06 – 2020-05-08 (×5): 0.1 mg via ORAL
  Filled 2020-05-06 (×5): qty 1

## 2020-05-06 MED ORDER — LABETALOL HCL 5 MG/ML IV SOLN: 10.0000 mg | INTRAVENOUS | Status: DC | PRN

## 2020-05-06 MED ORDER — INSULIN ASPART 100 UNIT/ML ~~LOC~~ SOLN
3.0000 [IU] | Freq: Three times a day (TID) | SUBCUTANEOUS | Status: DC
  Administered 2020-05-06: 3 [IU] via SUBCUTANEOUS

## 2020-05-06 MED ORDER — ADULT MULTIVITAMIN W/MINERALS CH
1.0000 | ORAL_TABLET | Freq: Every day | ORAL | Status: DC
  Administered 2020-05-06 – 2020-05-08 (×3): 1 via ORAL
  Filled 2020-05-06 (×3): qty 1

## 2020-05-06 MED ORDER — INSULIN ASPART 100 UNIT/ML ~~LOC~~ SOLN: 0.0000 [IU] | Freq: Every day | SUBCUTANEOUS | Status: DC

## 2020-05-06 MED ORDER — INSULIN ASPART 100 UNIT/ML ~~LOC~~ SOLN
7.0000 [IU] | Freq: Once | SUBCUTANEOUS | Status: AC
  Administered 2020-05-06: 7 [IU] via SUBCUTANEOUS

## 2020-05-06 MED ORDER — JUVEN PO PACK
1.0000 | PACK | Freq: Two times a day (BID) | ORAL | Status: DC
  Administered 2020-05-06 – 2020-05-08 (×4): 1 via ORAL
  Filled 2020-05-06 (×5): qty 1

## 2020-05-06 MED ORDER — INSULIN ASPART 100 UNIT/ML ~~LOC~~ SOLN
0.0000 [IU] | Freq: Three times a day (TID) | SUBCUTANEOUS | Status: DC
  Administered 2020-05-06: 5 [IU] via SUBCUTANEOUS
  Administered 2020-05-07: 8 [IU] via SUBCUTANEOUS
  Administered 2020-05-07: 15 [IU] via SUBCUTANEOUS

## 2020-05-06 MED ORDER — AMLODIPINE BESYLATE 5 MG PO TABS
5.0000 mg | ORAL_TABLET | Freq: Every morning | ORAL | Status: DC
  Administered 2020-05-06 – 2020-05-08 (×3): 5 mg via ORAL
  Filled 2020-05-06 (×3): qty 1

## 2020-05-06 NOTE — Plan of Care (Signed)
  Problem: Clinical Measurements: Goal: Will remain free from infection Outcome: Progressing Goal: Diagnostic test results will improve Outcome: Progressing   Problem: Activity: Goal: Risk for activity intolerance will decrease Outcome: Progressing   Problem: Nutrition: Goal: Adequate nutrition will be maintained Outcome: Progressing Note: Patient has transitioned to a regular diet. Dysphagia 3 ordered to help patient to tolerate texture due to poor dentition

## 2020-05-06 NOTE — Progress Notes (Signed)
Inpatient Diabetes Program Recommendations  AACE/ADA: New Consensus Statement on Inpatient Glycemic Control   Target Ranges:  Prepandial:   less than 140 mg/dL      Peak postprandial:   less than 180 mg/dL (1-2 hours)      Critically ill patients:  140 - 180 mg/dL  Results for CHANEL, MCADAMS (MRN [id number]) as of 05/06/2020 09:44  Ref. Range 05/06/2020 06:14  Glucose-Capillary Latest Ref Range: 70 - 99 mg/dL 181 (H)   Results for VINETTA, BRACH (MRN [id number]) as of 05/06/2020 09:44  Ref. Range 05/05/2020 06:32 05/05/2020 08:50 05/05/2020 09:56 05/05/2020 10:57 05/05/2020 11:54 05/05/2020 12:59 05/05/2020 14:07 05/05/2020 15:11 05/05/2020 16:17 05/05/2020 17:24 05/05/2020 18:36 05/05/2020 21:09  Glucose-Capillary Latest Ref Range: 70 - 99 mg/dL 174 (H) 175 (H) 160 (H) 143 (H) 182 (H) 171 (H) 216 (H) 224 (H) 231 (H) 151 (H) 127 (H) 249 (H)   Review of Glycemic Control  Diabetes history: DM2 Outpatient Diabetes medications: Januvia 100 mg daily, insulin daily (name and dose of insulin not noted in chart) Current orders for Inpatient glycemic control: Lantus 10 units daily, Novolog 0-9 units TID with meals, Novolog 0-5 units QHS  Inpatient Diabetes Program Recommendations:   Insulin - Basal: Please consider increasing Lantus to 12 units daily.  HgbA1C: A1C in process.  Thanks, Barnie Alderman, RN, MSN, CDE Diabetes Coordinator Inpatient Diabetes Program [phone] (Team Pager from 8am to 5pm)
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
## 2020-05-06 NOTE — NC FL2 (Signed)
[place] MEDICAID FL2 LEVEL OF CARE SCREENING TOOL     IDENTIFICATION  Patient Name: Angela Hays Birthdate: [birth year] Sex: female Admission Date (Current Location): 05/04/2020  [facility] and [place] Number:  Herbalist and Address:  [facility], [place] [address] [place], [place] [address]      Provider Number: [id number]  Attending Physician Name and Address:  Mercy Riding, MD  Relative Name and Phone Number:  [name] (son) [phone]    Current Level of Care: Hospital Recommended Level of Care: [place] Prior Approval Number:    Date Approved/Denied:   PASRR Number: [id number]  Discharge Plan: SNF    Current Diagnoses: Patient Active Problem List   Diagnosis Date Noted  . Hyperosmolar hyperglycemic state (HHS) ([place]) 05/05/2020  . Abnormal EKG 05/05/2020  . CKD (chronic kidney disease), stage III 05/05/2020  . History of ischemic stroke 05/05/2020  . Hypoglycemia due to insulin 11/19/2019  . Abdominal pain 09/14/2019  . Constipation 09/13/2019  . Colitis 09/13/2019  . HTN (hypertension) 09/13/2019  . Weakness   . Palliative care by specialist   . Goals of care, counseling/discussion   . Pressure injury of skin 06/17/2019  . Hyperkalemia 06/16/2019  . Renal insufficiency 06/16/2019  . Hyponatremia 06/16/2019  . Metabolic acidosis 84/13/2440  . Chronic diastolic CHF (congestive heart failure) ([place]) 06/16/2019  . Hypotension due to hypovolemia 06/16/2019  . Elevated troponin 06/16/2019  . Candidal intertrigo 06/16/2019  . AKI (acute kidney injury) ([place])   . ARF (acute renal failure) ([place]) 05/11/2016  . Leg ulcer ([place]) 05/11/2016  . Cellulitis and abscess of leg 05/06/2016  . Sepsis ([place]) 01/05/2016  . Pressure ulcer 01/05/2016  . Leg swelling 01/05/2016  . DM (diabetes mellitus), type 2 with peripheral vascular complications ([place]) 10/22/2535  . Hypertensive urgency 01/05/2016    Orientation RESPIRATION  BLADDER Height & Weight     Self, Time, Situation, Place  Normal Incontinent, External catheter Weight: 129 lb 3 oz (58.6 kg) Height:  5\' 3"  (160 cm)  BEHAVIORAL SYMPTOMS/MOOD NEUROLOGICAL BOWEL NUTRITION STATUS      Continent Diet(see discharge summary)  AMBULATORY STATUS COMMUNICATION OF NEEDS Skin   Limited Assist Verbally Other (Comment)(diabetic ulcer right and left ankles. diabetic ulcer right hip, venous statis ulcer sacrum,)                       Personal Care Assistance Level of Assistance  Bathing, Feeding, Dressing, Total care Bathing Assistance: Limited assistance Feeding assistance: Independent Dressing Assistance: Limited assistance Total Care Assistance: Limited assistance   Functional Limitations Info  Sight, Hearing, Speech Sight Info: Adequate Hearing Info: Adequate Speech Info: Adequate    SPECIAL CARE FACTORS FREQUENCY  PT (By licensed PT), OT (By licensed OT)     PT Frequency: min 5x weekly OT Frequency: min 5x weekly            Contractures Contractures Info: Not present    Additional Factors Info  Code Status, Allergies Code Status Info: DNR Allergies Info: No Known Allergies           Current Medications (05/06/2020):  This is the current hospital active medication list Current Facility-Administered Medications  Medication Dose Route Frequency Provider Last Rate Last Admin  . 0.9 %  sodium chloride infusion   Intravenous Continuous Charlann Lange, MD   Stopped at 05/05/20 0540  . 0.9 %  sodium chloride infusion   Intravenous Continuous Charlann Lange, MD      .  acetaminophen (TYLENOL) tablet 650 mg  650 mg Oral Q6H PRN Nicoletta Dress, Na, MD       Or  . acetaminophen (TYLENOL) suppository 650 mg  650 mg Rectal Q6H PRN Nicoletta Dress, Na, MD      . clopidogrel (PLAVIX) tablet 75 mg  75 mg Oral Daily Nicoletta Dress, Na, MD   75 mg at 05/05/20 0852  . dextrose 5 %-0.45 % sodium chloride infusion   Intravenous Continuous Charlann Lange, MD   Stopped at 05/05/20 1851  . dextrose 50 % solution 0-50  mL  0-50 mL Intravenous PRN Nicoletta Dress, Na, MD      . heparin injection 5,000 Units  5,000 Units Subcutaneous Q8H Nicoletta Dress, Na, MD   5,000 Units at 05/06/20 1610  . insulin aspart (novoLOG) injection 0-5 Units  0-5 Units Subcutaneous QHS Hosie Poisson, MD   2 Units at 05/05/20 2209  . insulin aspart (novoLOG) injection 0-9 Units  0-9 Units Subcutaneous TID WC Hosie Poisson, MD   2 Units at 05/06/20 0657  . insulin glargine (LANTUS) injection 10 Units  10 Units Subcutaneous Daily Hosie Poisson, MD   10 Units at 05/05/20 1409  . labetalol (NORMODYNE) injection 10 mg  10 mg Intravenous Q2H PRN Opyd, Ilene Qua, MD      . labetalol (NORMODYNE) tablet 50 mg  50 mg Oral BID Nicoletta Dress, Na, MD   50 mg at 05/05/20 2211  . ondansetron (ZOFRAN) tablet 4 mg  4 mg Oral Q6H PRN Nicoletta Dress, Na, MD       Or  . ondansetron (ZOFRAN) injection 4 mg  4 mg Intravenous Q6H PRN Nicoletta Dress, Na, MD      . simvastatin (ZOCOR) tablet 20 mg  20 mg Oral Q breakfast Nicoletta Dress, Na, MD   20 mg at 05/05/20 0852  . traMADol (ULTRAM) tablet 50 mg  50 mg Oral Q12H PRN Hosie Poisson, MD         Discharge Medications: Please see discharge summary for a list of discharge medications.  Relevant Imaging Results:  Relevant Lab Results:   Additional Information RUE:[id number]  Alberteen Sam, LCSW
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
## 2020-05-06 NOTE — Progress Notes (Signed)
Physical Therapy Treatment Patient Details Name: Angela Hays MRN: [id number] DOB: 07/05/1951 Today's Date: 05/06/2020    History of Present Illness Pt is a 69 y/o female admitted secondary to Hyperosmolar hyperglycemic state from uncontrolled DM. PMH includes DM, HTN, CVA, CKD, and CHF.     PT Comments    Pt progressing towards goals and was able to tolerate sitting on EOB. Required mod A for bed mobility. Performed seated RUE reaching tasks and passive stretching of BLE. Feel current recommendations appropriate. Will continue to follow acutely to maximize functional mobility independence and safety.     Follow Up Recommendations  SNF;Supervision/Assistance - 24 hour     Equipment Recommendations  None recommended by PT    Recommendations for Other Services       Precautions / Restrictions Precautions Precautions: Fall Restrictions Weight Bearing Restrictions: No    Mobility  Bed Mobility Overal bed mobility: Needs Assistance Bed Mobility: Supine to Sit;Sit to Supine     Supine to sit: Mod assist Sit to supine: Mod assist   General bed mobility comments: Mod A for trunk elevation and LE assist. Worked on sitting balance reaching tasks and required up to min A for balance.   Transfers                    Ambulation/Gait                 Stairs             Wheelchair Mobility    Modified Rankin (Stroke Patients Only)       Balance Overall balance assessment: Needs assistance Sitting-balance support: No upper extremity supported;Feet supported Sitting balance-Leahy Scale: Fair Sitting balance - Comments: Able to maintain static sitting without UE support                                     Cognition Arousal/Alertness: Awake/alert Behavior During Therapy: WFL for tasks assessed/performed Overall Cognitive Status: Within Functional Limits for tasks assessed                                         Exercises Other Exercises Other Exercises: Worked on passive stretching in BLE at knee and ankle.  Other Exercises: Worked on dynamic reaching tasks with RUE in sitting X20. Required up to min A to maintain sitting balance.     General Comments        Pertinent Vitals/Pain Pain Assessment: Faces Faces Pain Scale: Hurts a little bit Pain Location: BLE  Pain Descriptors / Indicators: Sore Pain Intervention(s): Limited activity within patient's tolerance;Monitored during session;Repositioned    Home Living                      Prior Function            PT Goals (current goals can now be found in the care plan section) Acute Rehab PT Goals Patient Stated Goal: to not go back home PT Goal Formulation: With patient Time For Goal Achievement: 05/19/20 Potential to Achieve Goals: Fair Progress towards PT goals: Progressing toward goals    Frequency    Min 2X/week      PT Plan Current plan remains appropriate    Co-evaluation  AM-PAC PT "6 Clicks" Mobility   Outcome Measure  Help needed turning from your back to your side while in a flat bed without using bedrails?: A Little Help needed moving from lying on your back to sitting on the side of a flat bed without using bedrails?: A Lot Help needed moving to and from a bed to a chair (including a wheelchair)?: A Lot Help needed standing up from a chair using your arms (e.g., wheelchair or bedside chair)?: Total Help needed to walk in hospital room?: Total Help needed climbing 3-5 steps with a railing? : Total 6 Click Score: 10    End of Session   Activity Tolerance: Patient tolerated treatment well Patient left: in bed;with call bell/phone within reach;with bed alarm set Nurse Communication: Mobility status PT Visit Diagnosis: Muscle weakness (generalized) (M62.81);Difficulty in walking, not elsewhere classified (R26.2)     Time: 6387-5643 PT Time Calculation (min) (ACUTE ONLY): 16  min  Charges:  $Therapeutic Activity: 8-22 mins                     Lou Miner, DPT  Acute Rehabilitation Services  Pager: [phone] Office: [phone]    Rudean Hitt 05/06/2020, 2:12 PM
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
## 2020-05-07 LAB — RENAL FUNCTION PANEL
Albumin: 3.1 g/dL — ABNORMAL LOW (ref 3.5–5.0)
Anion gap: 13 (ref 5–15)
BUN: 38 mg/dL — ABNORMAL HIGH (ref 8–23)
CO2: 24 mmol/L (ref 22–32)
Calcium: 9.8 mg/dL (ref 8.9–10.3)
Chloride: 98 mmol/L (ref 98–111)
Creatinine, Ser: 1.49 mg/dL — ABNORMAL HIGH (ref 0.44–1.00)
GFR calc Af Amer: 41 mL/min — ABNORMAL LOW (ref >60)
GFR calc non Af Amer: 35 mL/min — ABNORMAL LOW (ref >60)
Glucose, Bld: 292 mg/dL — ABNORMAL HIGH (ref 70–99)
Phosphorus: 2.6 mg/dL (ref 2.5–4.6)
Potassium: 3.2 mmol/L — ABNORMAL LOW (ref 3.5–5.1)
Sodium: 135 mmol/L (ref 135–145)

## 2020-05-07 LAB — MAGNESIUM: Magnesium: 2 mg/dL (ref 1.7–2.4)

## 2020-05-07 LAB — GLUCOSE, CAPILLARY
Glucose-Capillary: 274 mg/dL — ABNORMAL HIGH (ref 70–99)
Glucose-Capillary: 485 mg/dL — ABNORMAL HIGH (ref 70–99)
Glucose-Capillary: 317 mg/dL — ABNORMAL HIGH (ref 70–99)
Glucose-Capillary: 119 mg/dL — ABNORMAL HIGH (ref 70–99)

## 2020-05-07 MED ORDER — INSULIN GLARGINE 100 UNIT/ML ~~LOC~~ SOLN
10.0000 [IU] | Freq: Two times a day (BID) | SUBCUTANEOUS | Status: DC
  Administered 2020-05-07: 10 [IU] via SUBCUTANEOUS
  Filled 2020-05-07 (×2): qty 0.1

## 2020-05-07 MED ORDER — INSULIN ASPART 100 UNIT/ML ~~LOC~~ SOLN
5.0000 [IU] | Freq: Three times a day (TID) | SUBCUTANEOUS | Status: DC
  Administered 2020-05-07: 5 [IU] via SUBCUTANEOUS

## 2020-05-07 MED ORDER — INSULIN GLARGINE 100 UNIT/ML ~~LOC~~ SOLN
15.0000 [IU] | Freq: Two times a day (BID) | SUBCUTANEOUS | Status: DC
  Administered 2020-05-07 – 2020-05-08 (×2): 15 [IU] via SUBCUTANEOUS
  Filled 2020-05-07 (×3): qty 0.15

## 2020-05-07 MED ORDER — INSULIN ASPART 100 UNIT/ML ~~LOC~~ SOLN
0.0000 [IU] | Freq: Three times a day (TID) | SUBCUTANEOUS | Status: AC
  Administered 2020-05-07: 15 [IU] via SUBCUTANEOUS
  Administered 2020-05-08: 4 [IU] via SUBCUTANEOUS

## 2020-05-07 MED ORDER — INSULIN ASPART 100 UNIT/ML ~~LOC~~ SOLN: 0.0000 [IU] | Freq: Every day | SUBCUTANEOUS | Status: DC

## 2020-05-07 MED ORDER — INSULIN ASPART 100 UNIT/ML ~~LOC~~ SOLN
8.0000 [IU] | Freq: Three times a day (TID) | SUBCUTANEOUS | Status: DC
  Administered 2020-05-07 – 2020-05-08 (×3): 8 [IU] via SUBCUTANEOUS

## 2020-05-07 MED ORDER — POTASSIUM CHLORIDE CRYS ER 20 MEQ PO TBCR
40.0000 meq | EXTENDED_RELEASE_TABLET | ORAL | Status: AC
  Administered 2020-05-07 (×2): 40 meq via ORAL
  Filled 2020-05-07 (×2): qty 2

## 2020-05-07 NOTE — Progress Notes (Addendum)
Inpatient Diabetes Program Recommendations  AACE/ADA: New Consensus Statement on Inpatient Glycemic Control   Target Ranges:  Prepandial:   less than 140 mg/dL      Peak postprandial:   less than 180 mg/dL (1-2 hours)      Critically ill patients:  140 - 180 mg/dL  Results for MEARA, WIECHMAN (MRN [id number]) as of 05/07/2020 10:16  Ref. Range 05/06/2020 06:14 05/06/2020 11:17 05/06/2020 16:41 05/06/2020 21:26 05/07/2020 06:30  Glucose-Capillary Latest Ref Range: 70 - 99 mg/dL 181 (H) 292 (H) 239 (H) 407 (H) 274 (H)   Results for DALICIA, KISNER (MRN [id number]) as of 05/07/2020 10:16  Ref. Range 11/19/2019 03:14 05/05/2020 02:43  Hemoglobin A1C Latest Ref Range: 4.8 - 5.6 % 6.5 (H) >15.5 (H)    Review of Glycemic Control  Diabetes history: DM2 Outpatient Diabetes medications: Januvia 100 mg daily, Basaglar 15 units daily Current orders for Inpatient glycemic control: Lantus 10 units BID, Novolog 5 units TID with meals, Novolog 0-15 units TID with meals, Novolog 0-5 units QHS   Inpatient Diabetes Program Recommendations:   Insulin-Lantus was increased form 10 units daily to 10 units BID and Novolog meal coverage was added today.  HgbA1C: A1C >15.5% on 05/05/20 indicating an average glucose greater than 398 mg/dl over the past 2-3 months. Noted prior A1C was 6.5% on 11/19/19. Per chart, patient was taking Basaglar 15 units BID as an outpatient and was inpatient 11/19/19-11/21/19. When patient was discharged on 11/21/19, Basaglar was discontinued due to hypoglycemia and Januvia was prescribed. Anticipate patient will need to be taking insulin as an outpatient and is planning to discharge to SNF.  Addendum 05/07/20@12 :10-Spoke to patient regarding DM control. Patient reports that her family was giving her medications and checking her glucose. Patient states she is unsure what she is taking for DM control but she gets 15 units of insulin per day. Asked if Basaglar sounded familiar and she stated "yes,  that is the insulin I take".  Reviewed what an A1C is and informed patient that her A1C is >15.5% on 05/05/20 indicating her glucose has been greater than 398 mg/dl over the past 2-3 months. Discussed glucose and A1C goals. Patient stated that she is not going back home because she is going to SNF and that hopefully they can help her get her DM better controlled. Discussed importance of improved glycemic control to decrease risk of complications from uncontrolled DM. Patient verbalized understanding of information discussed and states that she has no questions at this time related to DM.  Thanks, Barnie Alderman, RN, MSN, CDE Diabetes Coordinator Inpatient Diabetes Program [phone] (Team Pager from 8am to 5pm)
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
## 2020-05-07 NOTE — Plan of Care (Signed)
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
## 2020-05-08 DIAGNOSIS — Z8673 Personal history of transient ischemic attack (TIA), and cerebral infarction without residual deficits: Secondary | ICD-10-CM

## 2020-05-08 DIAGNOSIS — Z789 Other specified health status: Secondary | ICD-10-CM

## 2020-05-08 DIAGNOSIS — Z66 Do not resuscitate: Secondary | ICD-10-CM

## 2020-05-08 DIAGNOSIS — E1151 Type 2 diabetes mellitus with diabetic peripheral angiopathy without gangrene: Secondary | ICD-10-CM

## 2020-05-08 LAB — RENAL FUNCTION PANEL
Albumin: 2.9 g/dL — ABNORMAL LOW (ref 3.5–5.0)
Anion gap: 8 (ref 5–15)
BUN: 53 mg/dL — ABNORMAL HIGH (ref 8–23)
CO2: 25 mmol/L (ref 22–32)
Calcium: 9.9 mg/dL (ref 8.9–10.3)
Chloride: 103 mmol/L (ref 98–111)
Creatinine, Ser: 1.64 mg/dL — ABNORMAL HIGH (ref 0.44–1.00)
GFR calc Af Amer: 36 mL/min — ABNORMAL LOW (ref >60)
GFR calc non Af Amer: 31 mL/min — ABNORMAL LOW (ref >60)
Glucose, Bld: 203 mg/dL — ABNORMAL HIGH (ref 70–99)
Phosphorus: 1.8 mg/dL — ABNORMAL LOW (ref 2.5–4.6)
Potassium: 4.7 mmol/L (ref 3.5–5.1)
Sodium: 136 mmol/L (ref 135–145)

## 2020-05-08 LAB — CBC
HCT: 41.1 % (ref 36.0–46.0)
Hemoglobin: 13.1 g/dL (ref 12.0–15.0)
MCH: 28 pg (ref 26.0–34.0)
MCHC: 31.9 g/dL (ref 30.0–36.0)
MCV: 87.8 fL (ref 80.0–100.0)
Platelets: 227 10*3/uL (ref 150–400)
RBC: 4.68 MIL/uL (ref 3.87–5.11)
RDW: 13.8 % (ref 11.5–15.5)
WBC: 7.1 10*3/uL (ref 4.0–10.5)
nRBC: 0 % (ref 0.0–0.2)

## 2020-05-08 LAB — GLUCOSE, CAPILLARY
Glucose-Capillary: 177 mg/dL — ABNORMAL HIGH (ref 70–99)
Glucose-Capillary: 212 mg/dL — ABNORMAL HIGH (ref 70–99)

## 2020-05-08 LAB — MAGNESIUM: Magnesium: 2.1 mg/dL (ref 1.7–2.4)

## 2020-05-08 MED ORDER — ACETAMINOPHEN 325 MG PO TABS: 650.0000 mg | ORAL_TABLET | Freq: Four times a day (QID) | ORAL | Status: DC | PRN

## 2020-05-08 MED ORDER — ADULT MULTIVITAMIN W/MINERALS CH: 1.0000 | ORAL_TABLET | Freq: Every day | ORAL | Status: DC

## 2020-05-08 MED ORDER — SITAGLIPTIN PHOSPHATE 50 MG PO TABS: 50.0000 mg | ORAL_TABLET | Freq: Every day | ORAL | 0 refills | Status: DC

## 2020-05-08 MED ORDER — JUVEN PO PACK: 1.0000 | PACK | Freq: Two times a day (BID) | ORAL | 0 refills | Status: DC

## 2020-05-08 MED ORDER — CLONIDINE HCL 0.1 MG PO TABS: 0.1000 mg | ORAL_TABLET | Freq: Two times a day (BID) | ORAL | Status: AC

## 2020-05-08 MED ORDER — AMLODIPINE BESYLATE 5 MG PO TABS: 5.0000 mg | ORAL_TABLET | Freq: Every morning | ORAL | Status: DC

## 2020-05-08 MED ORDER — TRAMADOL HCL 50 MG PO TABS: 50.0000 mg | ORAL_TABLET | Freq: Two times a day (BID) | ORAL | 0 refills | Status: DC | PRN

## 2020-05-08 MED ORDER — AMLODIPINE BESYLATE 10 MG PO TABS: 10.0000 mg | ORAL_TABLET | Freq: Every morning | ORAL | Status: AC

## 2020-05-08 MED ORDER — INSULIN ASPART 100 UNIT/ML ~~LOC~~ SOLN: 0.0000 [IU] | Freq: Three times a day (TID) | SUBCUTANEOUS | Status: DC

## 2020-05-08 MED ORDER — INSULIN ASPART 100 UNIT/ML ~~LOC~~ SOLN: SUBCUTANEOUS | 11 refills | Status: DC

## 2020-05-08 MED ORDER — INSULIN GLARGINE 100 UNIT/ML ~~LOC~~ SOLN: 15.0000 [IU] | Freq: Two times a day (BID) | SUBCUTANEOUS | 11 refills | Status: DC

## 2020-05-08 MED ORDER — FUROSEMIDE 40 MG PO TABS: 40.0000 mg | ORAL_TABLET | Freq: Every day | ORAL | Status: DC

## 2020-05-08 NOTE — Plan of Care (Signed)
  Problem: Education: Goal: Knowledge of General Education information will improve Description: Including pain rating scale, medication(s)/side effects and non-pharmacologic comfort measures Outcome: Adequate for Discharge   Problem: Health Behavior/Discharge Planning: Goal: Ability to manage health-related needs will improve Outcome: Adequate for Discharge   Problem: Clinical Measurements: Goal: Ability to maintain clinical measurements within normal limits will improve Outcome: Adequate for Discharge Goal: Will remain free from infection Outcome: Adequate for Discharge Goal: Diagnostic test results will improve Outcome: Adequate for Discharge Goal: Respiratory complications will improve Outcome: Adequate for Discharge Goal: Cardiovascular complication will be avoided Outcome: Adequate for Discharge   Problem: Activity: Goal: Risk for activity intolerance will decrease Outcome: Adequate for Discharge   Problem: Nutrition: Goal: Adequate nutrition will be maintained Outcome: Adequate for Discharge   Problem: Coping: Goal: Level of anxiety will decrease Outcome: Adequate for Discharge   Problem: Elimination: Goal: Will not experience complications related to bowel motility Outcome: Adequate for Discharge Goal: Will not experience complications related to urinary retention Outcome: Adequate for Discharge   Problem: Pain Managment: Goal: General experience of comfort will improve Outcome: Adequate for Discharge   Problem: Safety: Goal: Ability to remain free from injury will improve Outcome: Adequate for Discharge   Problem: Skin Integrity: Goal: Risk for impaired skin integrity will decrease Outcome: Adequate for Discharge  No new areas of skin injury noted. Patient states arm pain controlled with PRN tramadol. Patient able to feed self meals after set-up.

## 2020-05-08 NOTE — Progress Notes (Signed)
Nsg Discharge Note  Admit Date:  05/04/2020 Discharge date: 05/08/2020   Ander Slade to be D/C'd Nursing Home per MD order.  AVS completed and given to transport team. Report called to Shay at [facility].  Discharge Medication: Allergies as of 05/08/2020   No Known Allergies     Medication List    STOP taking these medications   doxazosin 4 MG tablet Commonly known as: CARDURA     TAKE these medications   acetaminophen 325 MG tablet Commonly known as: TYLENOL Take 2 tablets (650 mg total) by mouth every 6 (six) hours as needed for mild pain (or Fever >/= 101).   amLODipine 10 MG tablet Commonly known as: NORVASC Take 1 tablet (10 mg total) by mouth every morning. What changed:   medication strength  how much to take   cloNIDine 0.1 MG tablet Commonly known as: CATAPRES Take 1 tablet (0.1 mg total) by mouth 2 (two) times daily. What changed:   medication strength  how much to take  when to take this   clopidogrel 75 MG tablet Commonly known as: PLAVIX Take 75 mg by mouth daily.   furosemide 40 MG tablet Commonly known as: LASIX Take 1 tablet (40 mg total) by mouth daily. What changed:   how much to take  when to take this   insulin aspart 100 UNIT/ML injection Commonly known as: novoLOG CBG 70 - 120: 0 units CBG 121 - 150: 2 units CBG 151 - 200: 3 units CBG 201 - 250: 5 units CBG 251 - 300: 8 units CBG 301 - 350: 11 units CBG 351 - 400: 15 units CBG > 400: call MD and obtain STAT lab verification   insulin glargine 100 UNIT/ML injection Commonly known as: LANTUS Inject 0.15 mLs (15 Units total) into the skin 2 (two) times daily.   labetalol 100 MG tablet Commonly known as: NORMODYNE Take 100 mg by mouth 2 (two) times daily.   multivitamin with minerals Tabs tablet Take 1 tablet by mouth daily.   nutrition supplement (JUVEN) Pack Take 1 packet by mouth 2 (two) times daily between meals.   potassium chloride SA 20 MEQ tablet Commonly known as:  KLOR-CON Take 20 mEq by mouth every morning.   simvastatin 20 MG tablet Commonly known as: ZOCOR Take 20 mg by mouth daily with breakfast.   sitaGLIPtin 50 MG tablet Commonly known as: Januvia Take 1 tablet (50 mg total) by mouth daily. What changed: Another medication with the same name was removed. Continue taking this medication, and follow the directions you see here.   traMADol 50 MG tablet Commonly known as: ULTRAM Take 1 tablet (50 mg total) by mouth every 12 (twelve) hours as needed for moderate pain.       Discharge Assessment: Vitals:   05/08/20 0854 05/08/20 1137  BP: (!) 150/83 (!) 117/59  Pulse: 95 91  Resp: (!) 23 (!) 21  Temp: 99.6 F (37.6 C) 99 F (37.2 C)  SpO2: 100% 98%   Skin clean, dry and intact without evidence of skin break down, no evidence of skin tears noted. IV catheter discontinued intact. Site without signs and symptoms of complications - no redness or edema noted at insertion site, patient denies c/o pain - only slight tenderness at site.  Dressing with slight pressure applied.  D/c Instructions-Education: Report called to Shay at [facility], AVS printed and given to transport team. Patient escorted via stretcher, transported via [place]. Joni Reining, RN 05/08/2020 3:20 PM

## 2020-05-08 NOTE — Care Management Important Message (Signed)
Important Message  Patient Details  Name: JERIE BASFORD MRN: [id number] Date of Birth: [birth year]   Medicare Important Message Given:  Yes     Shelda Altes 05/08/2020, 9:54 AM
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
## 2020-05-08 NOTE — Progress Notes (Signed)
Report called to [facility], given to [employer].

## 2020-05-08 NOTE — Progress Notes (Signed)
Inpatient Diabetes Program Recommendations  AACE/ADA: New Consensus Statement on Inpatient Glycemic Control   Target Ranges:  Prepandial:   less than 140 mg/dL      Peak postprandial:   less than 180 mg/dL (1-2 hours)      Critically ill patients:  140 - 180 mg/dL   Results for Angela Hays, Angela Hays (MRN [id number]) as of 05/08/2020 10:26  Ref. Range 05/07/2020 06:30 05/07/2020 11:13 05/07/2020 16:05 05/07/2020 20:34 05/08/2020 06:10  Glucose-Capillary Latest Ref Range: 70 - 99 mg/dL 274 (H) 485 (H) 317 (H) 119 (H) 177 (H)   Review of Glycemic Control  Diabetes history: DM2 Outpatient Diabetes medications: Januvia 100 mg daily, Basaglar 15 units daily Current orders for Inpatient glycemic control: Lantus 15 units BID, Novolog 8 units TID with meals, Novolog 0-15 units TID with meals, Novolog 0-5 units QHS   Inpatient Diabetes Program Recommendations:   HgbA1C: A1C >15.5% on 05/05/20 indicating an average glucose greater than 398 mg/dl over the past 2-3 months. Noted prior A1C was 6.5% on 11/19/19. Per chart, patient was taking Basaglar 15 units BID as an outpatient and was inpatient 11/19/19-11/21/19. When patient was discharged on 11/21/19, Basaglar was discontinued due to hypoglycemia and Januvia was prescribed. Anticipate patient will need to be taking insulin as an outpatient and is planning to discharge to SNF. At time of discharge, recommend prescribing Lantus 15 units BID, Novolog correction scale, and likely will need Novolog meal coverage as well.  Thanks, Barnie Alderman, RN, MSN, CDE Diabetes Coordinator Inpatient Diabetes Program [phone] (Team Pager from 8am to 5pm)

## 2020-05-14 ENCOUNTER — Other Ambulatory Visit: Payer: Self-pay | Admitting: *Deleted
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
## 2020-06-03 ENCOUNTER — Other Ambulatory Visit: Payer: Self-pay | Admitting: *Deleted
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
## 2020-06-17 ENCOUNTER — Other Ambulatory Visit: Payer: Self-pay | Admitting: *Deleted

## 2020-06-17 NOTE — Patient Outreach (Signed)
Screened for potential [facility] Care Management needs as a benefit of  [employer] Medicare.  Ms. Mischke is currently receiving skilled therapy at [place].  Writer attended telephonic interdisciplinary team meeting to assess for disposition needs and transition plan for resident.   Facility reports member will transition to long term care at [facility].  No identifiable [facility] Care Management needs at this time.  Marthenia Rolling, MSN-Ed, RN,BSN [place] Acute Care Coordinator [phone] [facility]) [phone]  (Toll free office)

## 2021-09-11 IMAGING — CT CT ABD-PELV W/O CM
2 of 5 series · 16 of 46 positions shown, 18 images · non-contrast
Comparison: None.

CLINICAL DATA: Generalized abdominal pain

EXAM:
CT ABDOMEN AND PELVIS WITHOUT CONTRAST
TECHNIQUE: Multidetector CT imaging of the abdomen and pelvis was performed
following the standard protocol without IV contrast.

[Series 2: axial st · axial · 0.77mm/px · z∈[+1023,+1408]mm · 13 of 87 slices shown, 15 images]
[im 5/87  soft-tissue]
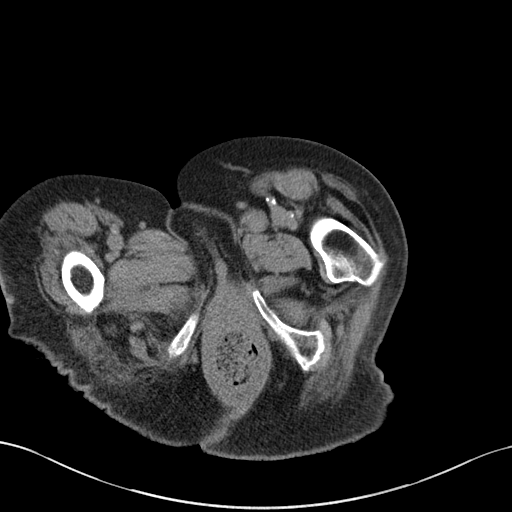
[im 5/87  bone]
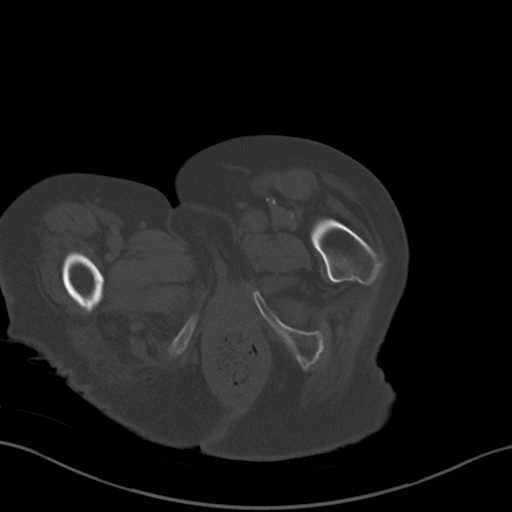
[im 13/87  soft-tissue]
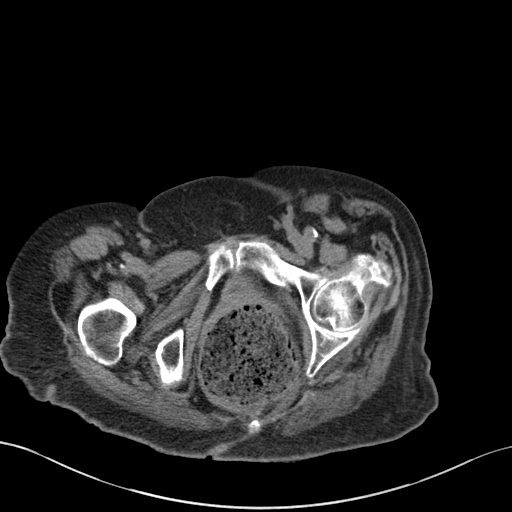
[im 18/87  soft-tissue]
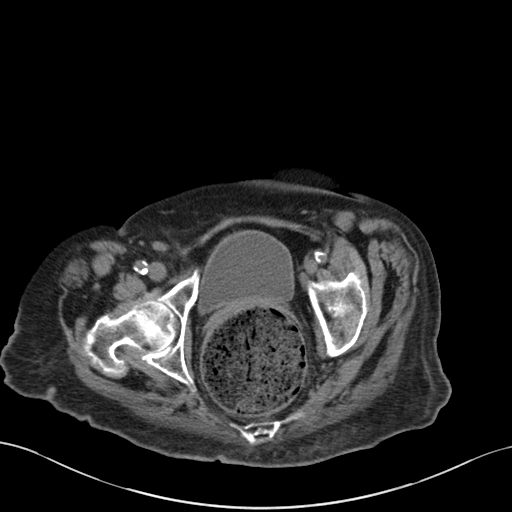
[im 26/87  soft-tissue]
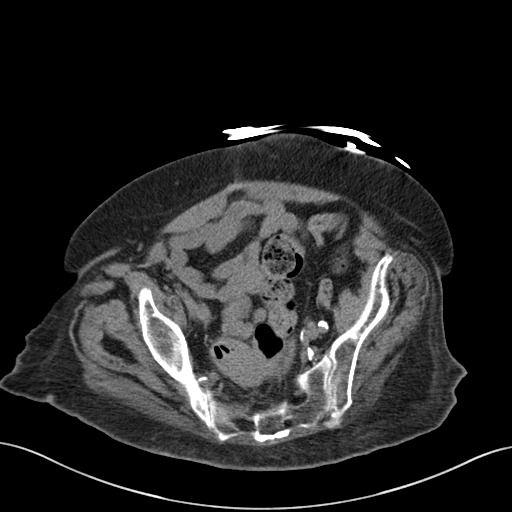
[im 31/87  soft-tissue]
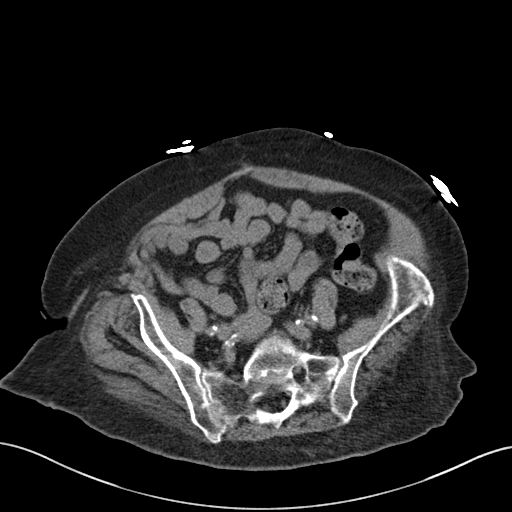
[im 39/87  soft-tissue]
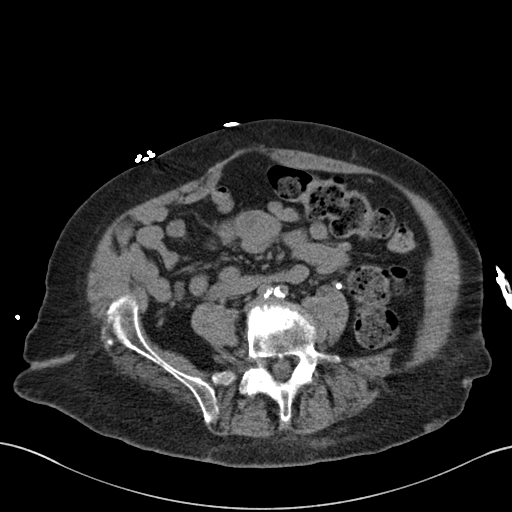
[im 44/87  soft-tissue]
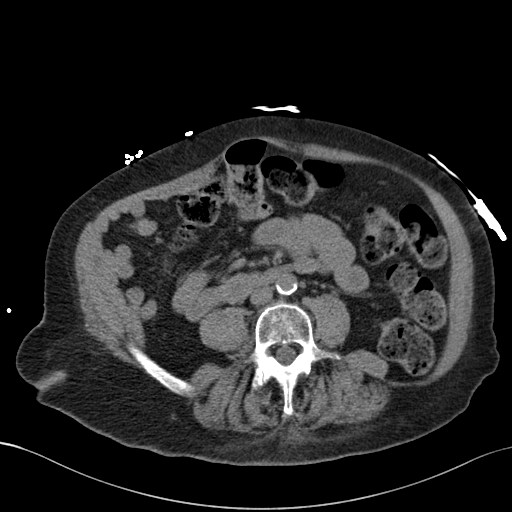
[im 48/87  soft-tissue]
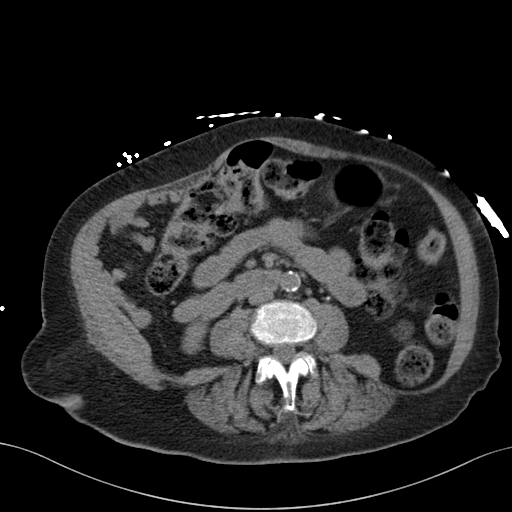
[im 56/87  soft-tissue]
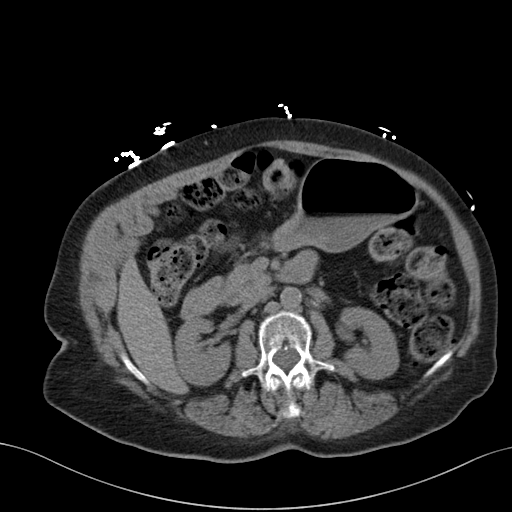
[im 56/87  bone]
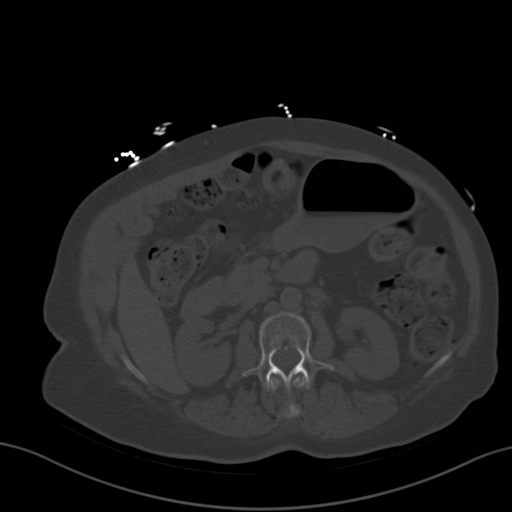
[im 61/87  soft-tissue]
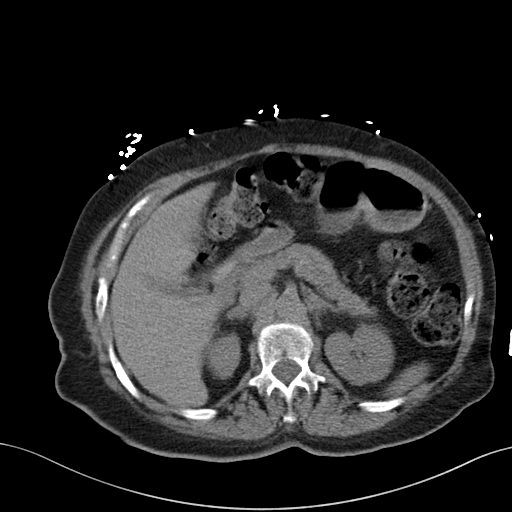
[im 69/87  soft-tissue]
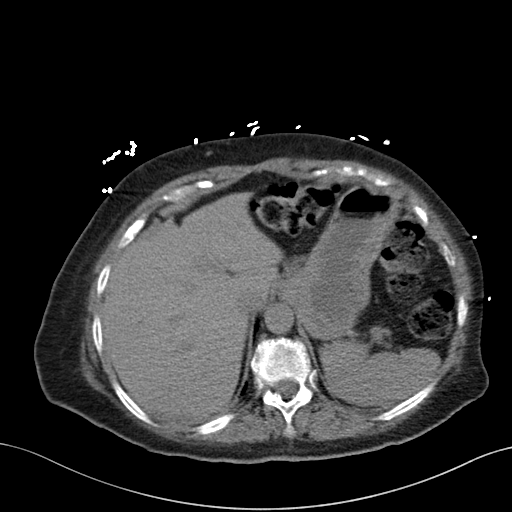
[im 74/87  soft-tissue]
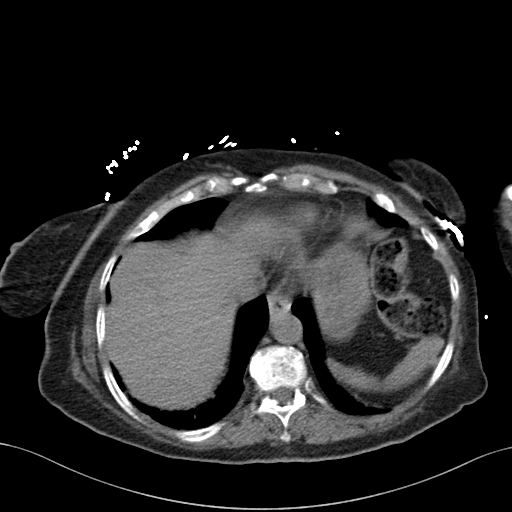
[im 82/87  soft-tissue]
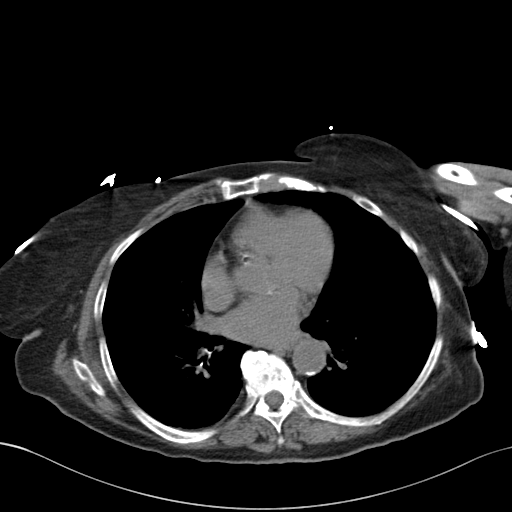

[Series 7: coronal st · coronal · 0.69mm/px · 3 of 129 slices shown]
[im 43/129  soft-tissue]
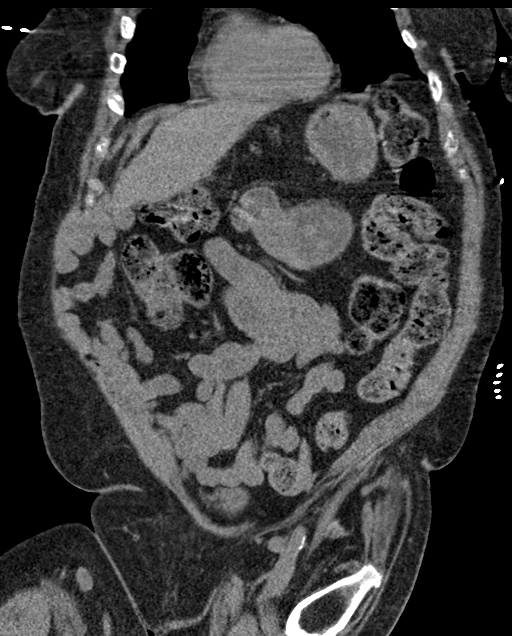
[im 57/129  soft-tissue]
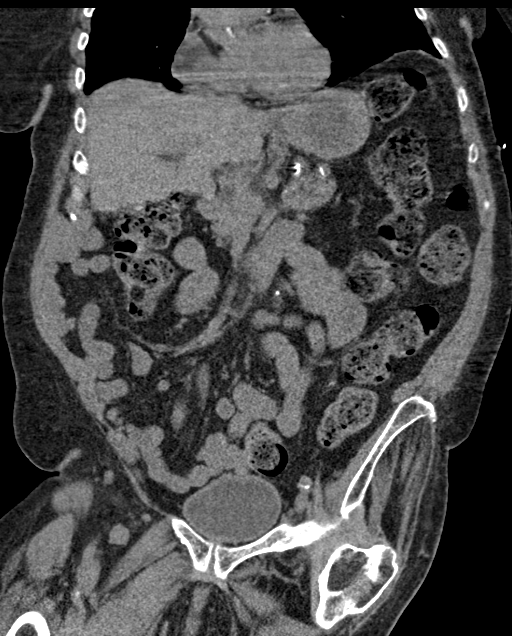
[im 72/129  soft-tissue]
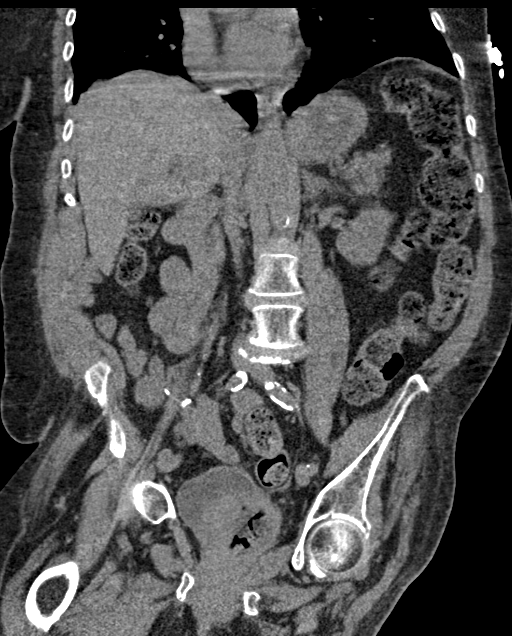

[16 of 46 positions shown; findings below may reference images not displayed]

FINDINGS: Lower chest: Lung bases are free of acute infiltrate or sizable
effusion.

Hepatobiliary: No focal liver abnormality is seen. No gallstones,
gallbladder wall thickening, or biliary dilatation.

Pancreas: Unremarkable. No pancreatic ductal dilatation or
surrounding inflammatory changes.

Spleen: Normal in size without focal abnormality.

Adrenals/Urinary Tract: Adrenal glands demonstrates some mild
hyperplasia. Small nonobstructing renal calculi are noted
bilaterally. The ureters are within normal limits. The bladder is
decompressed.

Stomach/Bowel: Changes of fecal impaction in the rectum are noted.
Some mild wall thickening is noted which may represent some
stercoral colitis. No obstructive changes are noted proximally. The
appendix is within normal limits. No small bowel or gastric
abnormality is seen.

Vascular/Lymphatic: Aortic atherosclerosis. No enlarged abdominal or
pelvic lymph nodes.

Reproductive: Status post hysterectomy. No adnexal masses.

Other: No free fluid is noted.

Musculoskeletal: Degenerative changes of lumbar spine are seen.
IMPRESSION: Rectal impaction with mild changes of stercoral colitis.

Tiny nonobstructing renal calculi bilaterally.

No other acute abnormality is seen.

## 2022-10-28 ENCOUNTER — Other Ambulatory Visit: Payer: Self-pay

## 2022-10-28 ENCOUNTER — Inpatient Hospital Stay (HOSPITAL_COMMUNITY)
Admission: EM | Admit: 2022-10-28 | Discharge: 2022-11-04 | DRG: 240 | Disposition: A | Discharge status: Skilled Nursing Facility | Payer: Medicare Other | Source: Skilled Nursing Facility | Attending: Internal Medicine | Admitting: Internal Medicine

## 2022-10-28 DIAGNOSIS — L97518 Non-pressure chronic ulcer of other part of right foot with other specified severity: Secondary | ICD-10-CM | POA: Diagnosis not present

## 2022-10-28 DIAGNOSIS — I96 Gangrene, not elsewhere classified: Secondary | ICD-10-CM | POA: Insufficient documentation

## 2022-10-28 DIAGNOSIS — M79671 Pain in right foot: Secondary | ICD-10-CM

## 2022-10-28 DIAGNOSIS — I159 Secondary hypertension, unspecified: Secondary | ICD-10-CM | POA: Diagnosis not present

## 2022-10-28 DIAGNOSIS — I5032 Chronic diastolic (congestive) heart failure: Secondary | ICD-10-CM | POA: Diagnosis present

## 2022-10-28 DIAGNOSIS — I110 Hypertensive heart disease with heart failure: Secondary | ICD-10-CM | POA: Diagnosis not present

## 2022-10-28 DIAGNOSIS — L89222 Pressure ulcer of left hip, stage 2: Secondary | ICD-10-CM | POA: Diagnosis present

## 2022-10-28 DIAGNOSIS — E11628 Type 2 diabetes mellitus with other skin complications: Secondary | ICD-10-CM | POA: Diagnosis present

## 2022-10-28 DIAGNOSIS — E1122 Type 2 diabetes mellitus with diabetic chronic kidney disease: Secondary | ICD-10-CM | POA: Diagnosis present

## 2022-10-28 DIAGNOSIS — F32A Depression, unspecified: Secondary | ICD-10-CM | POA: Diagnosis present

## 2022-10-28 DIAGNOSIS — I69354 Hemiplegia and hemiparesis following cerebral infarction affecting left non-dominant side: Secondary | ICD-10-CM | POA: Diagnosis not present

## 2022-10-28 DIAGNOSIS — I998 Other disorder of circulatory system: Secondary | ICD-10-CM | POA: Diagnosis not present

## 2022-10-28 DIAGNOSIS — I70261 Atherosclerosis of native arteries of extremities with gangrene, right leg: Secondary | ICD-10-CM | POA: Diagnosis not present

## 2022-10-28 DIAGNOSIS — L97519 Non-pressure chronic ulcer of other part of right foot with unspecified severity: Secondary | ICD-10-CM | POA: Diagnosis present

## 2022-10-28 DIAGNOSIS — E11649 Type 2 diabetes mellitus with hypoglycemia without coma: Secondary | ICD-10-CM | POA: Diagnosis present

## 2022-10-28 DIAGNOSIS — I130 Hypertensive heart and chronic kidney disease with heart failure and stage 1 through stage 4 chronic kidney disease, or unspecified chronic kidney disease: Secondary | ICD-10-CM | POA: Diagnosis present

## 2022-10-28 DIAGNOSIS — Z89611 Acquired absence of right leg above knee: Secondary | ICD-10-CM | POA: Diagnosis not present

## 2022-10-28 DIAGNOSIS — E785 Hyperlipidemia, unspecified: Secondary | ICD-10-CM | POA: Diagnosis present

## 2022-10-28 DIAGNOSIS — I872 Venous insufficiency (chronic) (peripheral): Secondary | ICD-10-CM | POA: Diagnosis present

## 2022-10-28 DIAGNOSIS — N184 Chronic kidney disease, stage 4 (severe): Secondary | ICD-10-CM | POA: Diagnosis present

## 2022-10-28 DIAGNOSIS — L03115 Cellulitis of right lower limb: Secondary | ICD-10-CM | POA: Diagnosis present

## 2022-10-28 DIAGNOSIS — Z993 Dependence on wheelchair: Secondary | ICD-10-CM | POA: Diagnosis not present

## 2022-10-28 DIAGNOSIS — I509 Heart failure, unspecified: Secondary | ICD-10-CM | POA: Diagnosis not present

## 2022-10-28 DIAGNOSIS — L089 Local infection of the skin and subcutaneous tissue, unspecified: Secondary | ICD-10-CM

## 2022-10-28 DIAGNOSIS — E1151 Type 2 diabetes mellitus with diabetic peripheral angiopathy without gangrene: Secondary | ICD-10-CM | POA: Diagnosis not present

## 2022-10-28 DIAGNOSIS — Z833 Family history of diabetes mellitus: Secondary | ICD-10-CM

## 2022-10-28 DIAGNOSIS — Z87891 Personal history of nicotine dependence: Secondary | ICD-10-CM | POA: Diagnosis not present

## 2022-10-28 DIAGNOSIS — E1152 Type 2 diabetes mellitus with diabetic peripheral angiopathy with gangrene: Principal | ICD-10-CM | POA: Diagnosis present

## 2022-10-28 DIAGNOSIS — I70221 Atherosclerosis of native arteries of extremities with rest pain, right leg: Secondary | ICD-10-CM | POA: Diagnosis present

## 2022-10-28 DIAGNOSIS — M109 Gout, unspecified: Secondary | ICD-10-CM | POA: Diagnosis present

## 2022-10-28 DIAGNOSIS — N179 Acute kidney failure, unspecified: Secondary | ICD-10-CM | POA: Diagnosis present

## 2022-10-28 DIAGNOSIS — R69 Illness, unspecified: Secondary | ICD-10-CM

## 2022-10-28 DIAGNOSIS — Z79899 Other long term (current) drug therapy: Secondary | ICD-10-CM

## 2022-10-28 DIAGNOSIS — Z794 Long term (current) use of insulin: Secondary | ICD-10-CM

## 2022-10-28 DIAGNOSIS — Z7902 Long term (current) use of antithrombotics/antiplatelets: Secondary | ICD-10-CM | POA: Diagnosis not present

## 2022-10-28 DIAGNOSIS — I878 Other specified disorders of veins: Secondary | ICD-10-CM | POA: Diagnosis present

## 2022-10-28 DIAGNOSIS — E11621 Type 2 diabetes mellitus with foot ulcer: Secondary | ICD-10-CM | POA: Diagnosis present

## 2022-10-28 DIAGNOSIS — L97508 Non-pressure chronic ulcer of other part of unspecified foot with other specified severity: Secondary | ICD-10-CM | POA: Diagnosis not present

## 2022-10-28 DIAGNOSIS — M79674 Pain in right toe(s): Secondary | ICD-10-CM | POA: Diagnosis present

## 2022-10-28 DIAGNOSIS — E119 Type 2 diabetes mellitus without complications: Secondary | ICD-10-CM | POA: Diagnosis not present

## 2022-10-28 DIAGNOSIS — Z7401 Bed confinement status: Secondary | ICD-10-CM

## 2022-10-28 LAB — PROTIME-INR
INR: 1.1 (ref 0.8–1.2)
Prothrombin Time: 14.5 seconds (ref 11.4–15.2)

## 2022-10-28 LAB — CBC WITH DIFFERENTIAL/PLATELET
Abs Immature Granulocytes: 0.08 10*3/uL — ABNORMAL HIGH (ref 0.00–0.07)
Basophils Absolute: 0.1 10*3/uL (ref 0.0–0.1)
Basophils Relative: 1 %
Eosinophils Absolute: 0.2 10*3/uL (ref 0.0–0.5)
Eosinophils Relative: 2 %
HCT: 33.5 % — ABNORMAL LOW (ref 36.0–46.0)
Hemoglobin: 10.3 g/dL — ABNORMAL LOW (ref 12.0–15.0)
Immature Granulocytes: 1 %
Lymphocytes Relative: 7 %
Lymphs Abs: 0.9 10*3/uL (ref 0.7–4.0)
MCH: 30 pg (ref 26.0–34.0)
MCHC: 30.7 g/dL (ref 30.0–36.0)
MCV: 97.7 fL (ref 80.0–100.0)
Monocytes Absolute: 0.6 10*3/uL (ref 0.1–1.0)
Monocytes Relative: 4 %
Neutro Abs: 11.7 10*3/uL — ABNORMAL HIGH (ref 1.7–7.7)
Neutrophils Relative %: 85 %
Platelets: 452 10*3/uL — ABNORMAL HIGH (ref 150–400)
RBC: 3.43 MIL/uL — ABNORMAL LOW (ref 3.87–5.11)
RDW: 14.8 % (ref 11.5–15.5)
WBC: 13.5 10*3/uL — ABNORMAL HIGH (ref 4.0–10.5)
nRBC: 0 % (ref 0.0–0.2)

## 2022-10-28 LAB — COMPREHENSIVE METABOLIC PANEL
ALT: 13 U/L (ref 0–44)
AST: 19 U/L (ref 15–41)
Albumin: 2.5 g/dL — ABNORMAL LOW (ref 3.5–5.0)
Alkaline Phosphatase: 80 U/L (ref 38–126)
Anion gap: 11 (ref 5–15)
BUN: 58 mg/dL — ABNORMAL HIGH (ref 8–23)
CO2: 20 mmol/L — ABNORMAL LOW (ref 22–32)
Calcium: 10.1 mg/dL (ref 8.9–10.3)
Chloride: 113 mmol/L — ABNORMAL HIGH (ref 98–111)
Creatinine, Ser: 2.42 mg/dL — ABNORMAL HIGH (ref 0.44–1.00)
GFR, Estimated: 21 mL/min — ABNORMAL LOW (ref >60)
Glucose, Bld: 87 mg/dL (ref 70–99)
Potassium: 4.6 mmol/L (ref 3.5–5.1)
Sodium: 144 mmol/L (ref 135–145)
Total Bilirubin: 0.5 mg/dL (ref 0.3–1.2)
Total Protein: 7.2 g/dL (ref 6.5–8.1)

## 2022-10-28 LAB — LACTIC ACID, PLASMA
Lactic Acid, Venous: 0.9 mmol/L (ref 0.5–1.9)
Lactic Acid, Venous: 0.6 mmol/L (ref 0.5–1.9)

## 2022-10-28 MED ORDER — ACETAMINOPHEN 325 MG PO TABS: 325.0000 mg | ORAL_TABLET | Freq: Four times a day (QID) | ORAL | Status: DC | PRN

## 2022-10-28 MED ORDER — SENNOSIDES-DOCUSATE SODIUM 8.6-50 MG PO TABS
1.0000 | ORAL_TABLET | Freq: Two times a day (BID) | ORAL | Status: DC
  Administered 2022-10-29 – 2022-11-04 (×13): 1 via ORAL
  Filled 2022-10-28 (×13): qty 1

## 2022-10-28 MED ORDER — SERTRALINE HCL 50 MG PO TABS
25.0000 mg | ORAL_TABLET | Freq: Every day | ORAL | Status: DC
  Administered 2022-10-29 – 2022-11-03 (×7): 25 mg via ORAL
  Filled 2022-10-28 (×7): qty 1

## 2022-10-28 MED ORDER — AMLODIPINE BESYLATE 10 MG PO TABS
10.0000 mg | ORAL_TABLET | Freq: Every day | ORAL | Status: DC
  Administered 2022-10-29 – 2022-11-04 (×6): 10 mg via ORAL
  Filled 2022-10-28: qty 2
  Filled 2022-10-28: qty 1
  Filled 2022-10-28: qty 2
  Filled 2022-10-28 (×3): qty 1

## 2022-10-28 MED ORDER — HEPARIN SODIUM (PORCINE) 5000 UNIT/ML IJ SOLN
5000.0000 [IU] | Freq: Three times a day (TID) | INTRAMUSCULAR | Status: DC
  Administered 2022-10-29 – 2022-10-30 (×6): 5000 [IU] via SUBCUTANEOUS
  Filled 2022-10-28 (×6): qty 1

## 2022-10-28 MED ORDER — HYDRALAZINE HCL 25 MG PO TABS
25.0000 mg | ORAL_TABLET | Freq: Four times a day (QID) | ORAL | Status: DC
  Administered 2022-10-29 – 2022-11-04 (×23): 25 mg via ORAL
  Filled 2022-10-28 (×23): qty 1

## 2022-10-28 MED ORDER — LABETALOL HCL 100 MG PO TABS
100.0000 mg | ORAL_TABLET | Freq: Two times a day (BID) | ORAL | Status: DC
  Administered 2022-10-29 – 2022-11-04 (×13): 100 mg via ORAL
  Filled 2022-10-28 (×3): qty 1
  Filled 2022-10-28: qty 0.5
  Filled 2022-10-28: qty 1
  Filled 2022-10-28: qty 0.5
  Filled 2022-10-28 (×4): qty 1
  Filled 2022-10-28 (×3): qty 0.5

## 2022-10-28 MED ORDER — INSULIN ASPART 100 UNIT/ML IJ SOLN
0.0000 [IU] | Freq: Three times a day (TID) | INTRAMUSCULAR | Status: DC
  Administered 2022-10-30 – 2022-11-01 (×5): 2 [IU] via SUBCUTANEOUS
  Administered 2022-11-01: 1 [IU] via SUBCUTANEOUS
  Administered 2022-11-01: 5 [IU] via SUBCUTANEOUS
  Administered 2022-11-02: 2 [IU] via SUBCUTANEOUS
  Administered 2022-11-02: 5 [IU] via SUBCUTANEOUS
  Administered 2022-11-02: 2 [IU] via SUBCUTANEOUS
  Administered 2022-11-03: 3 [IU] via SUBCUTANEOUS
  Administered 2022-11-03 (×2): 5 [IU] via SUBCUTANEOUS
  Administered 2022-11-04 (×2): 3 [IU] via SUBCUTANEOUS

## 2022-10-28 MED ORDER — SIMVASTATIN 20 MG PO TABS
20.0000 mg | ORAL_TABLET | Freq: Every day | ORAL | Status: DC
  Administered 2022-10-29 – 2022-11-03 (×6): 20 mg via ORAL
  Filled 2022-10-28 (×7): qty 1

## 2022-10-28 MED ORDER — CLONIDINE HCL 0.2 MG PO TABS
0.3000 mg | ORAL_TABLET | Freq: Two times a day (BID) | ORAL | Status: DC
  Administered 2022-10-29 – 2022-11-04 (×13): 0.3 mg via ORAL
  Filled 2022-10-28 (×13): qty 1

## 2022-10-28 MED ORDER — INSULIN GLARGINE-YFGN 100 UNIT/ML ~~LOC~~ SOLN
10.0000 [IU] | Freq: Every day | SUBCUTANEOUS | Status: DC
  Filled 2022-10-28 (×3): qty 0.1

## 2022-10-28 MED ORDER — HYDROMORPHONE HCL 1 MG/ML IJ SOLN
0.5000 mg | Freq: Once | INTRAMUSCULAR | Status: AC
  Administered 2022-10-28: 0.5 mg via INTRAVENOUS
  Filled 2022-10-28: qty 1

## 2022-10-28 NOTE — ED Triage Notes (Signed)
From SNF via EMS with c/o compromised blood flow/ischemia to RLE worsening over 2 weeks. Patient has been being treated for suspected cellulitis of same extremity with IV abx x 2 weeks per EMS. Facility attempted to take patient to vascular specialist today, sent home due to no scheduled appt.

## 2022-10-28 NOTE — ED Notes (Signed)
Vascular Surgeon at bedside. 

## 2022-10-28 NOTE — ED Notes (Signed)
Patient denies pain and is resting comfortably.  

## 2022-10-28 NOTE — Progress Notes (Signed)
Pharmacy Antibiotic Note  Angela Hays is a 71 y.o. female admitted on 10/28/2022 with RLE cellultis.  Pharmacy has been consulted for Vancomycin dosing.  Plan: Vancomycin 1250 mg IV now, then Vancomycin 1000 mg IV q48h  Height: 5\' 3"  (160 cm) Weight: 71.2 kg (157 lb) IBW/kg (Calculated) : 52.4  Temp (24hrs), Avg:97.9 F (36.6 C), Min:97.5 F (36.4 C), Max:98.3 F (36.8 C)  Recent Labs  Lab 10/28/22 1751 10/28/22 2019  WBC 13.5*  --   CREATININE 2.42*  --   LATICACIDVEN 0.9 0.6    Estimated Creatinine Clearance: 19.6 mL/min (A) (by C-G formula based on SCr of 2.42 mg/dL (H)).    No Known Allergies   Angela Hays 10/28/2022 11:43 PM

## 2022-10-28 NOTE — H&P (Incomplete)
Date: 10/28/2022               Patient Name:  Angela Hays MRN: [id number]  DOB: [birth year] Age / Sex: 71 y.o., female   PCP: Pcp, No         Medical Service: Internal Medicine Teaching Service         Attending Physician: Dr. Aldine Contes, MD      First Contact: Dr. Johny Blamer, DO Pager [phone]    Second Contact: Dr. Buddy Duty, DO Pager [phone]         After Hours (After 5p/  First Contact Pager: [phone]  weekends / holidays): Second Contact Pager: [phone]   SUBJECTIVE   Chief Complaint: R Big Toe Pain  History of Present Illness:   [name] is a 71 year old female with past medical history of T2DM, HTN, CKD Stage IV, Chronic Diastolic Heart failure, CVA w/ residual left sided weakness, HLD, constipation, obesity, depression, and goat who presents to the emergency department with a four day history of severe R toe pain. Per daughter, on Monday pt had feet cleaned by staff at [facility] (where she has resided for the last three years), and Tuesday she woke up with bilateral leg soreness. The left leg soreness dissipated, but then she started to develop R big toe pain. The pain is "20/10", and is now a stabbing pain that is localized to her toe. She has tried some tylenol for the pain with no relief, and she was given another medication for her pain that she did find relief with but was unable to remember the last one.   On 10/28, per documentation review, it seemed like patient had multiple right foot ulcers or cellulitis. Ulcers did not seem to be getting better, so ABI's were ordered which were normal. On November 1st at her living facility, a foot x-ray was done which showed no fracture, large plantar calcaenous spur, and mild hallux valgus and osteoarthritic changes. WBC count at the time was 16.8. However, with ongoing concern for PAD, she had an appointment with Vascular scheduled on 11/9. However, pt had worsening pain in her right toe, and her daughter  requested facility to transfer patient to emergency department.   ED Course:  CBC revealed a WBC count of 13.5, BMP revealed creatinine of 2.13. Vascular was consulted for concern of necrotic toe.   Meds:   Januvia 20m Clopidogrel 76mLabetalol 10020mmlodipine 20m20mnna  Simvastatin 20mg74monidine .3mg  58mtus 15U at beditime  Zoloft 25mg  39mlax  Humalog before meals and bedtime  Hydralazine 25mg Q615mlenol  Probiotic 250mg  Ga24mntin 100mg TID 18mmadol 50mg   Pas66mdical History  T2DM HTN CKD Stage IV Chronic Diastolic Heart failure CVA w/ left sided weakness HLD  Depression Gout - not on urate lowering therapy  Past Surgical History:  Procedure Laterality Date   CATARACT EXTRACTION  07/17/2019    Social:  Lives With: [place] [employer]: N/A Support: Staff at [employer]: Wheelchair bound PCP: [place] [facility] AidSubstances: 3 pack year cigarette history from 2003-2006, denies any other substances  Family History:  Father- Diabetes Mother- Diabetes  Allergies: Allergies as of 10/28/2022   (No Known Allergies)    Review of Systems: A complete ROS was negative except as per HPI.   OBJECTIVE:   Physical Exam: Blood pressure 124/79, pulse 80, temperature 98.3 F (36.8 C), temperature source Oral, resp. rate 14, height _0  (  1.6 m), weight 71.2 kg, SpO2 99 %.  Constitutional: Pleasant, elderly woman, in no acute distress HENT: normocephalic atraumatic, mucous membranes moist Eyes: conjunctiva non-erythematous Neck: supple Cardiovascular: regular rate and rhythm, no m/r/g Pulmonary/Chest: Wheezes heard bilateral lung field Abdominal: soft, non-tender, non-distended MSK: Erythema noted on dorsal aspect of R foot up to mid shin, heel ulcer noted on medial side of right foot eschar starting to form over it, and eschar covering R big toe. R foot diffusely tender to palpation. LLE swollen. Neurological: alert  & oriented x 3 Skin: Sloughing of skin present on bilateral lower extremities, sacral wound present on L buttock           Labs: CBC    Component Value Date/Time   WBC 13.5 (H) 10/28/2022 1751   RBC 3.43 (L) 10/28/2022 1751   HGB 10.3 (L) 10/28/2022 1751   HCT 33.5 (L) 10/28/2022 1751   HCT 38.4 06/19/2019 0245   PLT 452 (H) 10/28/2022 1751   MCV 97.7 10/28/2022 1751   MCH 30.0 10/28/2022 1751   MCHC 30.7 10/28/2022 1751   RDW 14.8 10/28/2022 1751   LYMPHSABS 0.9 10/28/2022 1751   MONOABS 0.6 10/28/2022 1751   EOSABS 0.2 10/28/2022 1751   BASOSABS 0.1 10/28/2022 1751     CMP     Component Value Date/Time   NA 144 10/28/2022 1751   K 4.6 10/28/2022 1751   CL 113 (H) 10/28/2022 1751   CO2 20 (L) 10/28/2022 1751   GLUCOSE 87 10/28/2022 1751   BUN 58 (H) 10/28/2022 1751   CREATININE 2.42 (H) 10/28/2022 1751   CALCIUM 10.1 10/28/2022 1751   PROT 7.2 10/28/2022 1751   ALBUMIN 2.5 (L) 10/28/2022 1751   AST 19 10/28/2022 1751   ALT 13 10/28/2022 1751   ALKPHOS 80 10/28/2022 1751   BILITOT 0.5 10/28/2022 1751   GFRNONAA 21 (L) 10/28/2022 1751   GFRAA 36 (L) 05/08/2020 8850    Imaging:   X-Ray done on 11/01 revealed no acute fracture, large plantar calcaneal spur, and mild hallux valgus and osteoarthritic changes.   EKG: N/A  ASSESSMENT & PLAN:   Assessment & Plan by Problem: Principal Problem:   Right foot pain   Angela Hays is a 71 y.o. person living with a history of  T2DM, HTN, CKD Stage IV, Chronic Diastolic Heart failure, TIA w/ left sided weakness, HLD, depression, gout who presented with right foot pain and admitted for right foot pain workup on hospital day 0  #Non-Healing Ulcer of R Big Toe vs Necrotic Toe #Cellulitis of R Foot  Pt was started on vancomycin for treatment of cellulitis of R foot by the physician at her facility. Upon discussion with [employer] and reviewing their documentation, it seems she had a non-healing ulcer on her R  big toe that was being treated with betadine dressing changes. Unable to get pulses on that foot due to it being extremely tender on palpation. Most recent ABI's done on 10/29 revealed no abnormality. X-Ray imaging of foot obtained on 11/1 did not review any new abnormalities. However, from their documentation, did not notice darkening of the toe. Could be an eschar formation over a non-healing ulcer that was not documented previously. ESR and CRP were elevated, as well as WBC at 13.5. WBC count earlier this week was 16.8.   She is hemodynamically stable, does not look septic, and blood cultures obtained at her housing facility were negative.   Vancomycin therapy was started to due cellulitis  on the dorsum of foot. Diffusely tender to palpation and erythematous in the midline. We will expand coverage to encompass anaerobes and gram negatives.   Plan:  - F/U ABI - F/U MRI - Prevlon Boots - Vascular surgery to see pt in the morning  - Recommend obtaining Podiatry consult - Continue Vancomycin (Day 2) - Started Cefepime and Metronidazole for broader coverage (Day 1)  #Pre-renal AKI vs Progression of CKD IV Pt presented with creatinine of 2.42. Her last creatinine done at her housing facility was 2.49. She has not had labs done within the system in over two years, however at that time was 1.64. Unsure if this is progression of her CKD or an AKI in the setting of her acute illness.   Plan:  - Encourage PO intake  - Avoid nephrotoxic medications  - Obtain renal ultrasound if kidney function does not improve  #Type II Diabetes Mellitus  Last A1c done two years ago was over 15.5. Her home regimen includes: Januvia, Lantus at beditime, Humalog before meals and at bedtime. Current A1c is pending.   Plan:  - Sliding scale insulin  - Semglee 10U at bedtime  #Chronic Diastolic Heart Failure (EF 65-70%) Last echo done two years ago revealed an EF of 65-70% with no regional wall motion abnormalities,  consistent with Grade one diastolic failure. No cardiology note is seen in our system.   Plan:  - Continue Home labetalol 115m BID  #Hypertension  Pt's blood pressure has been stable since arrival to the emergency department, we will continue her home regimen of Amlodipine 147m clonidine .56m62mand hydralazine 38m55m#Hx of CVA  Pt had CVA with residual L sided paresis, and on clopidogrel. Will hold until vascular surgery/podiatry consultation in the AM.   #Depression Started home Zoloft 38mg46miet: Renal VTE: Heparin IVF: None,None Code: Full  Prior to Admission [facility] with expected length of stay greater than 2 midnights.  Signed: Chianne Byrns Drucie OpitzInternal Medicine Resident PGY-1  10/28/2022, 9:48 PM   Dr. Bosco Paparella Drucie OpitzPager [phone]

## 2022-10-28 NOTE — ED Provider Notes (Signed)
Angela Hays   CSN: [id number] Arrival date & time: 10/28/22  1707     History  Chief Complaint  Patient presents with   Circulatory Problem    Angela Hays is a 71 y.o. female.  HPI Patient reports that her right foot hurts a lot.  She reports has been getting treated for infection and getting dressings.  She reports her right great toe has become severely painful and she can hardly stand to have the foot touched anymore.  She reports other than the foot she has been feeling really good lately.  She has not had fevers or chills or felt generally ill.  Patient was initially treated with Bactrim for suspected foot infection from 10\26 210\30.  Patient was then changed to ertapenem from 10\30 until of the right foot with no acute fracture identified and no other specific acute findings..  She had also been treated with doxycycline from 10\23 until 10\30.  Vancomycin was ordered administered by PICC line starting 11 1.  X-rays were obtained outpatient.  Arterial vascular studies obtained outpatient as follows.  Patient was transferred from [facility].                                    Home Medications Prior to Admission medications   Medication Sig Start Date End Date Taking? Authorizing Provider  acetaminophen (TYLENOL) 325 MG tablet Take 2 tablets (650 mg total) by mouth every 6 (six) hours as needed for mild pain (or Fever >/= 101). 05/08/20   Mercy Riding, MD  amLODipine (NORVASC) 10 MG tablet Take 1 tablet (10 mg total) by mouth every morning. 05/08/20   Mercy Riding, MD  cloNIDine (CATAPRES) 0.1 MG tablet Take 1 tablet (0.1 mg total) by mouth 2 (two) times daily. 05/08/20   Mercy Riding, MD  clopidogrel (PLAVIX) 75 MG tablet Take 75 mg by mouth daily.    [provider]  furosemide (LASIX) 40 MG tablet Take 1 tablet (40 mg total) by mouth daily. 05/08/20   Mercy Riding, MD   insulin aspart (NOVOLOG) 100 UNIT/ML injection CBG 70 - 120: 0 units CBG 121 - 150: 2 units CBG 151 - 200: 3 units CBG 201 - 250: 5 units CBG 251 - 300: 8 units CBG 301 - 350: 11 units CBG 351 - 400: 15 units CBG > 400: call MD and obtain STAT lab verification 05/08/20   Mercy Riding, MD  insulin glargine (LANTUS) 100 UNIT/ML injection Inject 0.15 mLs (15 Units total) into the skin 2 (two) times daily. 05/08/20   Mercy Riding, MD  labetalol (NORMODYNE) 100 MG tablet Take 100 mg by mouth 2 (two) times daily. 06/06/18   [provider]  Multiple Vitamin (MULTIVITAMIN WITH MINERALS) TABS tablet Take 1 tablet by mouth daily. 05/08/20   Mercy Riding, MD  nutrition supplement, JUVEN, (JUVEN) PACK Take 1 packet by mouth 2 (two) times daily between meals. 05/08/20   Mercy Riding, MD  potassium chloride SA (KLOR-CON) 20 MEQ tablet Take 20 mEq by mouth every morning. 02/24/20   [provider]  simvastatin (ZOCOR) 20 MG tablet Take 20 mg by mouth daily with breakfast.    [provider]  sitaGLIPtin (JANUVIA) 50 MG tablet Take 1 tablet (50 mg total) by mouth daily. 05/08/20 06/07/20  Mercy Riding, MD  traMADol (  ULTRAM) 50 MG tablet Take 1 tablet (50 mg total) by mouth every 12 (twelve) hours as needed for moderate pain. 05/08/20   Mercy Riding, MD      Allergies    Patient has no known allergies.    Review of Systems   Review of Systems  Physical Exam Updated Vital Signs BP (!) 163/85   Pulse 82   Temp (!) 97.5 F (36.4 C) (Oral)   Resp 15   Ht 5\' 3"  (1.6 m)   Wt 71.2 kg   SpO2 99%   BMI 27.81 kg/m  Physical Exam Constitutional:      Appearance: Normal appearance.  HENT:     Mouth/Throat:     Pharynx: Oropharynx is clear.  Eyes:     Extraocular Movements: Extraocular movements intact.  Cardiovascular:     Rate and Rhythm: Normal rate and regular rhythm.  Pulmonary:     Effort: Pulmonary effort is normal.     Breath sounds: Normal breath sounds.   Abdominal:     General: There is no distension.     Palpations: Abdomen is soft.     Tenderness: There is no abdominal tenderness.  Musculoskeletal:     Comments: See attached images. Unable to palpate DP pulse or find with hand held doppler  Skin:    General: Skin is warm and dry.  Neurological:     General: No focal deficit present.     Mental Status: She is alert and oriented to person, place, and time.  Psychiatric:        Mood and Affect: Mood normal.          ED Results / Procedures / Treatments   Labs (all labs ordered are listed, but only abnormal results are displayed) Labs Reviewed - No data to display  EKG None  Radiology No results found.  Procedures Procedures    Medications Ordered in ED Medications - No data to display  ED Course/ Medical Decision Making/ A&P                           Medical Decision Making Amount and/or Complexity of Data Reviewed Labs: ordered.  Risk Prescription drug management. Decision regarding hospitalization.  Consult: Reviewed with vascular surgery Dr. Virl Cagey.  Vascular surgery will consult for further evaluation of possible ischemic wounds.  Consult: Reviewed with internal medicine teaching service for admission.  Patient presents clinically well.  She does not appear septic.  She has however had severe worsening foot wounds which now appear to have gangrene.  I have extensively reviewed medical records sent with her from [facility].  She has had multiple antibiotic therapies and continues to worsen.  We will proceed with evaluation for sepsis with lactic acid, blood cultures, chemistries and blood counts.  Patient does have significant pain associated with wounds.  Will manage pain with Dilaudid in small increments.  Labs do show leukocytosis, lactic acid normal.  With otherwise stable vital signs and no constitutional symptoms of sepsis will await empiric antibiotics until assessment by vascular surgery  for possible interventional management versus amputation.         Final Clinical Impression(s) / ED Diagnoses Final diagnoses:  Gangrene of toe of right foot ([place])  Severe comorbid illness    Rx / DC Orders ED Discharge Orders     None         Charlesetta Shanks, MD 11/18/22 [phone]

## 2022-10-28 NOTE — Hospital Course (Addendum)
Angela Hays is a 71 y.o. with a pertinent PMH of type 2 diabetes, hypertension, CKD stage IV, HFpEF, CVA with residual left-sided weakness, HLD, depression, and gout who presented to the ED with 4 days of severe right foot pain and is admitted for cellulitis of the right foot.     Nonhealing ulcer of the right foot toe, likely necrotic Cellulitis of the right lower extremity Poor peripheral perfusion secondary to PAD and chronic venous insufficiency Patient presented with progressively worsening critical limb ischemia on the right with nonhealing ulcers in both weightbearing and nonweightbearing areas with evidence of necrosis.  Vascular surgery was consulted for further evaluation and recommended above-the-knee amputation of the right.  Also discussed progression of disease in the left, fortunately there are no ulcers on the left yet.  She will likely need an amputation on the left in the future but this was deferred during this admission.  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  She had a very large decrease in the amount of pain she was experiencing and did not require any postop pain medication.  She received antibiotics which included vancomycin, metronidazole, cefepime from 11/2 - 11/8.  She was discharged in good condition with plans to change the dressings on her right AKA every other day.  She will follow-up with vascular surgery outpatient.   Prerenal AKI on CKD stage IV  Creatinine on admission of 2.4 which quickly improved and was stable at 1.5-1.6, consistent with baseline 2 years ago.   Type 2 diabetes mellitus A1c here of 6.1. Home regimen of Januvia, Lantus, Humalog.  Blood sugars are well controlled with sliding scale insulin and the eventual addition of insulin glargine 5 units at bedtime.  We recommend that her insulin be titrated as it appears she was on 20 units nightly of glargine before coming to the hospital.   HFpEF Hypertension History of  CVA with left-sided paresis Chronic conditions above are stable during admission without evidence of heart failure exacerbation, uncontrolled hypertension, or progression of her deficits or any new deficits.  She was continued on home medications during her admission.   Depression She was continued on Zoloft 25 mg daily during her admission.

## 2022-10-29 ENCOUNTER — Encounter (HOSPITAL_COMMUNITY): Payer: Medicare Other

## 2022-10-29 ENCOUNTER — Inpatient Hospital Stay (HOSPITAL_COMMUNITY): Payer: Medicare Other

## 2022-10-29 DIAGNOSIS — E119 Type 2 diabetes mellitus without complications: Secondary | ICD-10-CM | POA: Diagnosis not present

## 2022-10-29 DIAGNOSIS — I5032 Chronic diastolic (congestive) heart failure: Secondary | ICD-10-CM | POA: Diagnosis not present

## 2022-10-29 DIAGNOSIS — I998 Other disorder of circulatory system: Secondary | ICD-10-CM | POA: Diagnosis not present

## 2022-10-29 LAB — CBC WITH DIFFERENTIAL/PLATELET
Abs Immature Granulocytes: 0.1 10*3/uL — ABNORMAL HIGH (ref 0.00–0.07)
Basophils Absolute: 0.1 10*3/uL (ref 0.0–0.1)
Basophils Relative: 1 %
Eosinophils Absolute: 0.3 10*3/uL (ref 0.0–0.5)
Eosinophils Relative: 2 %
HCT: 27.9 % — ABNORMAL LOW (ref 36.0–46.0)
Hemoglobin: 8.7 g/dL — ABNORMAL LOW (ref 12.0–15.0)
Immature Granulocytes: 1 %
Lymphocytes Relative: 8 %
Lymphs Abs: 0.9 10*3/uL (ref 0.7–4.0)
MCH: 30 pg (ref 26.0–34.0)
MCHC: 31.2 g/dL (ref 30.0–36.0)
MCV: 96.2 fL (ref 80.0–100.0)
Monocytes Absolute: 0.6 10*3/uL (ref 0.1–1.0)
Monocytes Relative: 5 %
Neutro Abs: 9.1 10*3/uL — ABNORMAL HIGH (ref 1.7–7.7)
Neutrophils Relative %: 83 %
Platelets: 373 10*3/uL (ref 150–400)
RBC: 2.9 MIL/uL — ABNORMAL LOW (ref 3.87–5.11)
RDW: 14.8 % (ref 11.5–15.5)
WBC: 11 10*3/uL — ABNORMAL HIGH (ref 4.0–10.5)
nRBC: 0 % (ref 0.0–0.2)

## 2022-10-29 LAB — GLUCOSE, CAPILLARY
Glucose-Capillary: 107 mg/dL — ABNORMAL HIGH (ref 70–99)
Glucose-Capillary: 109 mg/dL — ABNORMAL HIGH (ref 70–99)
Glucose-Capillary: 62 mg/dL — ABNORMAL LOW (ref 70–99)
Glucose-Capillary: 65 mg/dL — ABNORMAL LOW (ref 70–99)
Glucose-Capillary: 87 mg/dL (ref 70–99)

## 2022-10-29 LAB — BASIC METABOLIC PANEL
Anion gap: 7 (ref 5–15)
BUN: 56 mg/dL — ABNORMAL HIGH (ref 8–23)
CO2: 19 mmol/L — ABNORMAL LOW (ref 22–32)
Calcium: 9.7 mg/dL (ref 8.9–10.3)
Chloride: 113 mmol/L — ABNORMAL HIGH (ref 98–111)
Creatinine, Ser: 2.13 mg/dL — ABNORMAL HIGH (ref 0.44–1.00)
GFR, Estimated: 24 mL/min — ABNORMAL LOW (ref >60)
Glucose, Bld: 94 mg/dL (ref 70–99)
Potassium: 4.4 mmol/L (ref 3.5–5.1)
Sodium: 139 mmol/L (ref 135–145)

## 2022-10-29 LAB — HEMOGLOBIN A1C
Hgb A1c MFr Bld: 6.1 % — ABNORMAL HIGH (ref 4.8–5.6)
Mean Plasma Glucose: 128.37 mg/dL

## 2022-10-29 MED ORDER — ACETAMINOPHEN 325 MG PO TABS: 650.0000 mg | ORAL_TABLET | Freq: Four times a day (QID) | ORAL | Status: DC | PRN

## 2022-10-29 MED ORDER — ACETAMINOPHEN 500 MG PO TABS
1000.0000 mg | ORAL_TABLET | Freq: Three times a day (TID) | ORAL | Status: DC
  Administered 2022-10-29 – 2022-10-31 (×6): 1000 mg via ORAL
  Filled 2022-10-29 (×8): qty 2

## 2022-10-29 MED ORDER — METRONIDAZOLE 500 MG/100ML IV SOLN
500.0000 mg | Freq: Three times a day (TID) | INTRAVENOUS | Status: AC
  Administered 2022-10-29 – 2022-11-02 (×15): 500 mg via INTRAVENOUS
  Filled 2022-10-29 (×15): qty 100

## 2022-10-29 MED ORDER — HYDROMORPHONE HCL 1 MG/ML IJ SOLN: 1.0000 mg | INTRAMUSCULAR | Status: DC | PRN

## 2022-10-29 MED ORDER — VANCOMYCIN HCL 1250 MG/250ML IV SOLN
1250.0000 mg | Freq: Once | INTRAVENOUS | Status: AC
  Administered 2022-10-29: 1250 mg via INTRAVENOUS
  Filled 2022-10-29: qty 250

## 2022-10-29 MED ORDER — HYDROMORPHONE HCL 1 MG/ML IJ SOLN
1.0000 mg | INTRAMUSCULAR | Status: DC | PRN
  Administered 2022-10-29 – 2022-10-31 (×4): 1 mg via INTRAVENOUS
  Filled 2022-10-29 (×4): qty 1

## 2022-10-29 MED ORDER — CHLORHEXIDINE GLUCONATE CLOTH 2 % EX PADS
6.0000 | MEDICATED_PAD | Freq: Every day | CUTANEOUS | Status: DC
  Administered 2022-10-29 – 2022-11-04 (×7): 6 via TOPICAL

## 2022-10-29 MED ORDER — VANCOMYCIN HCL IN DEXTROSE 1-5 GM/200ML-% IV SOLN
1000.0000 mg | INTRAVENOUS | Status: DC
  Administered 2022-10-30: 1000 mg via INTRAVENOUS
  Filled 2022-10-29: qty 200

## 2022-10-29 MED ORDER — INSULIN GLARGINE-YFGN 100 UNIT/ML ~~LOC~~ SOLN
10.0000 [IU] | Freq: Every day | SUBCUTANEOUS | Status: DC
  Filled 2022-10-29: qty 0.1

## 2022-10-29 MED ORDER — SODIUM CHLORIDE 0.9 % IV SOLN
2.0000 g | Freq: Every day | INTRAVENOUS | Status: AC
  Administered 2022-10-29 – 2022-11-02 (×6): 2 g via INTRAVENOUS
  Filled 2022-10-29 (×6): qty 12.5

## 2022-10-29 MED ORDER — HYDROMORPHONE HCL 1 MG/ML IJ SOLN
0.5000 mg | INTRAMUSCULAR | Status: DC | PRN
  Administered 2022-10-29: 0.5 mg via INTRAVENOUS
  Filled 2022-10-29: qty 0.5

## 2022-10-29 NOTE — Progress Notes (Signed)
PICC assessment: PICC present on admission, placed at outside facility. Gauze dressing with dried bloody drainage noted under transparent film. Site unremarkable except for 2cm linear bruise superior to insertion site. Site cleaned, secure port, antimicrobial disc, new securement device and dressing applied. Recommend chest Xray for tip confirmation.

## 2022-10-29 NOTE — Progress Notes (Signed)
HD#1 Subjective:   Summary: Angela Hays is a 70 year old female with a past medical history of type 2 diabetes, hypertension, CKD stage IV, HFpEF, CVA with residual left-sided weakness, HLD, depression, and gout who presented to the ED with 4 days of severe right foot pain and is admitted for cellulitis of the right foot.  Overnight Events: Patient admitted overnight please see H&P note.  Patient is stable this morning with moderate to severe tenderness to palpation over right foot.  She denies any new or worsening issues.  She is hungry.  Objective:  Vital signs in last 24 hours: Vitals:   10/28/22 2300 10/28/22 2330 10/28/22 2356 10/29/22 0410  BP: (!) 151/68 (!) 141/76 (!) 154/72 (!) 127/56  Pulse: 76 75 88 78  Resp: 13 12    Temp:   97.9 F (36.6 C) 98 F (36.7 C)  TempSrc:   Oral Oral  SpO2: 98% 98% 100% 100%  Weight:      Height:       Supplemental O2: Room Air SpO2: 100 %   Physical Exam:  Constitutional: Elderly female sitting in bed, in no acute distress Cardiovascular: regular rate and rhythm Pulmonary/Chest: normal work of breathing on room air, mild expiratory wheezes noted bilaterally Abdominal: soft, non-tender, non-distended, positive bowel sounds MSK: 2-3+ pitting lower extremity edema bilaterally below the knees with skin sloughing and loss of hair below the knees as well.  Erythema noted surrounding necrotic right first toe and extending up the foot to the mid shin.  No tenderness along the lymphatic tracks to the knee.  Multiple necrotic appearing ulcers in the right foot without drainage. Neurological: alert & oriented x 3 Skin: warm and dry  Filed Weights   10/28/22 1721  Weight: 71.2 kg     Intake/Output Summary (Last 24 hours) at 10/29/2022 0653 Last data filed at 10/29/2022 0548 Gross per 24 hour  Intake 450 ml  Output 300 ml  Net 150 ml   Net IO Since Admission: 150 mL [10/29/22 0653]  Pertinent Labs:    Latest Ref Rng & Units  10/29/2022    2:48 AM 10/28/2022    5:51 PM 05/08/2020    6:42 AM  CBC  WBC 4.0 - 10.5 K/uL 11.0  13.5  7.1   Hemoglobin 12.0 - 15.0 g/dL 8.7  10.3  13.1   Hematocrit 36.0 - 46.0 % 27.9  33.5  41.1   Platelets 150 - 400 K/uL 373  452  227        Latest Ref Rng & Units 10/29/2022    2:48 AM 10/28/2022    5:51 PM 05/08/2020    6:42 AM  CMP  Glucose 70 - 99 mg/dL 94  87  203   BUN 8 - 23 mg/dL 56  58  53   Creatinine 0.44 - 1.00 mg/dL 2.13  2.42  1.64   Sodium 135 - 145 mmol/L 139  144  136   Potassium 3.5 - 5.1 mmol/L 4.4  4.6  4.7   Chloride 98 - 111 mmol/L 113  113  103   CO2 22 - 32 mmol/L 19  20  25    Calcium 8.9 - 10.3 mg/dL 9.7  10.1  9.9   Total Protein 6.5 - 8.1 g/dL  7.2    Total Bilirubin 0.3 - 1.2 mg/dL  0.5    Alkaline Phos 38 - 126 U/L  80    AST 15 - 41 U/L  19    ALT  0 - 44 U/L  13      Imaging: No results found.  Assessment/Plan:   Active Problems:   Gangrene of right foot [facility])   Patient Summary: Angela Hays is a 71 y.o. with a pertinent PMH of type 2 diabetes, hypertension, CKD stage IV, HFpEF, CVA with residual left-sided weakness, HLD, depression, and gout who presented to the ED with 4 days of severe right foot pain and is admitted for cellulitis of the right foot.   Nonhealing ulcer of the right foot toe, likely necrotic Cellulitis of the right lower extremity Poor peripheral perfusion likely secondary to PAD and chronic venous insufficiency No progression of symptoms or signs of sepsis at this time.  Both feet do appear to be chronically underperfused but ABIs done last week showed no abnormality.  Right lower extremity appears worse with evidence of necrosis in multiple areas with the largest being the right first toe.  Large amount of lower extremity edema consistent with chronic venous stasis.  Ulcers were noted in nonweightbearing areas consistent with poor perfusion likely from peripheral artery disease.  Vascular has been consulted and patient  reports that they recommend amputation.  We do believe that a CTA of the lower extremities would be useful for evaluation of future risk of limb ischemia, will defer until decision is made on surgery. - Vascular consult, recommend above-knee amputation on the right, will discuss with daughter - MRI of the left foot - Prevalon boots - Consider podiatry consult after vascular work-up or if considering evaluation for smaller amputation - Continue vancomycin (11/2) and cefepime/metronidazole (11/3)  Prerenal AKI on CKD stage IV vs progression of CKD Creatinine slightly improved from 2.42 to 2.13. - Encourage p.o. intake and we will continue to monitor  Type 2 diabetes mellitus A1c here of 6.1.  Home regimen of Januvia, Lantus, Humalog.  Blood sugars this morning were low so we will hold her long-acting today. - SSI, plan to start Semglee 10 units at bedtime if sugars tolerate  HFpEF Hypertension History of CVA with left-sided paresis Stable symptoms and no current signs of exacerbation. - Continue labetalol 100 mg twice daily, amlodipine 10 mg daily, clonidine 0.3 mg twice daily, and hydralazine 25 mg every 6 hours - Hold clopidogrel  Depression Continue Zoloft 25 mg daily  Diet: Renal IVF: None VTE: Heparin Code: Full Family Update: Plan to discuss updates with the daughter today.   Dispo: Anticipated discharge depending on decision for surgery.  [place] Internal Medicine Resident PGY-1 Pager: [phone]  Please contact the on call pager after 5 pm and on weekends at [phone].

## 2022-10-29 NOTE — Consult Note (Addendum)
Hospital Consult    Reason for Consult: Right lower extremity wounds Requesting Physician: ED MRN #:  [id number]  History of Present Illness: This is a 71 y.o. female with prior history of stroke, hypertension, diabetes, heart failure who presents with right lower extremity wounds.  She was scheduled to see our group as an outpatient next week.  Vascular surgery was called due to the wounds to the right lower extremity and concern for peripheral arterial disease.  On exam, Angela Hays was resting comfortably.  She stated she had not walked in quite some time and was using a wheelchair full-time.  She is currently living in an assisted living facility.  Angela Hays stated the wounds on the right leg have been present for weeks to months.  She has significant pain associated.  Past Medical History:  Diagnosis Date   Chronic diastolic (congestive) heart failure (HCC)    Diabetes mellitus without complication ([place])    Hyperkalemia 11/19/2019   Hypertension    Stroke [facility])     Past Surgical History:  Procedure Laterality Date   CATARACT EXTRACTION  07/17/2019    No Known Allergies  Prior to Admission medications   Medication Sig Start Date End Date Taking? Authorizing Provider  acetaminophen (TYLENOL) 325 MG tablet Take 2 tablets (650 mg total) by mouth every 6 (six) hours as needed for mild pain (or Fever >/= 101). 05/08/20  Yes Mercy Riding, MD  amLODipine (NORVASC) 10 MG tablet Take 1 tablet (10 mg total) by mouth every morning. 05/08/20  Yes Mercy Riding, MD  cefTRIAXone (ROCEPHIN) IVPB Inject 1 g into the vein daily. For infection of right foot for 30 days 1 gram once daily through picc line x 30 days   Yes [provider]  cloNIDine (CATAPRES) 0.1 MG tablet Take 1 tablet (0.1 mg total) by mouth 2 (two) times daily. Patient taking differently: Take 0.3 mg by mouth 2 (two) times daily. 05/08/20  Yes Mercy Riding, MD  clopidogrel (PLAVIX) 75 MG tablet Take 75 mg by mouth daily.    Yes [provider]  doxycycline (MONODOX) 100 MG capsule Take 100 mg by mouth 2 (two) times daily. 10/17/22  Yes [provider]  ertapenem (INVANZ) 1 g injection Inject 1 g into the muscle daily. Reconstituted 1 gram.  Ertapenem Sodium Injection Solution:  Inject 500 mg intramuscularly one time a day for skin infections for 10 days   Yes [provider]  gabapentin (NEURONTIN) 100 MG capsule Take 100 mg by mouth 3 (three) times daily. 10/21/22  Yes [provider]  HUMALOG KWIKPEN 100 UNIT/ML KwikPen Inject into the skin See admin instructions. CBG 70 - 120: 0 units CBG 121 - 150: 3 units CBG 151 - 200: 4 units CBG 201 - 250: 6 units CBG 251 - 300: 8 units CBG 301 - 350: 10 units CBG 351 - 400: 12 units CBG 401 - 999: 14 units Call MD for BS>400, Subcutaneously before meals and at bedtime 10/13/22  Yes [provider]  hydrALAZINE (APRESOLINE) 25 MG tablet Take by mouth. 10/25/22  Yes [provider]  insulin glargine (LANTUS) 100 UNIT/ML injection Inject 0.15 mLs (15 Units total) into the skin 2 (two) times daily. Patient taking differently: Inject 20 Units into the skin at bedtime. 05/08/20  Yes Mercy Riding, MD  ipratropium-albuterol (DUONEB) 0.5-2.5 (3) MG/3ML SOLN Take by nebulization. 06/17/22  Yes [provider]  labetalol (NORMODYNE) 100 MG tablet Take 100 mg by mouth 2 (  two) times daily. 06/06/18  Yes [provider]  lidocaine (XYLOCAINE) 1 % (with preservative) injection SMARTSIG:Rectally 10/24/22  Yes [provider]  Multiple Vitamin (MULTIVITAMIN WITH MINERALS) TABS tablet Take 1 tablet by mouth daily. 05/08/20  Yes Mercy Riding, MD  polyethylene glycol (MIRALAX / GLYCOLAX) 17 g packet Take 17 g by mouth daily.   Yes [provider]  saccharomyces boulardii (FLORASTOR) 250 MG capsule Take 250 mg by mouth 2 (two) times daily. Probiotic capsule   Yes [provider]  senna (SENOKOT)  8.6 MG TABS tablet Take 2 tablets by mouth in the morning and at bedtime.   Yes [provider]  sertraline (ZOLOFT) 25 MG tablet Take 25 mg by mouth daily.   Yes [provider]  simvastatin (ZOCOR) 20 MG tablet Take 20 mg by mouth at bedtime.   Yes [provider]  sitaGLIPtin (JANUVIA) 50 MG tablet Take 1 tablet (50 mg total) by mouth daily. 05/08/20 10/28/22 Yes Wendee Beavers T, MD  sodium chloride 0.9 % infusion Inject 60 mLs into the vein See admin instructions. Use 60 mL/hr intravenous every day and night shift for hydration with IV antibiotics for 30 days 60 mL/hr continuous x 30 days with IV therapy 10/28/22  Yes [provider]  sulfamethoxazole-trimethoprim (BACTRIM DS) 800-160 MG tablet Take 1 tablet by mouth 2 (two) times daily. 10/20/22  Yes [provider]  traMADol (ULTRAM) 50 MG tablet Take 1 tablet (50 mg total) by mouth every 12 (twelve) hours as needed for moderate pain. 05/08/20  Yes Mercy Riding, MD  vancomycin (VANCOCIN) 500 MG injection 500 mg by Other route See admin instructions. Reconstituted 500 MC (Vancomycin HCL) Use 500 mg intravenously every 12 hours for Skin infection for 30 Days 500 mg IV Q12 x 30 Days for infection 10/28/22  Yes [provider]  doxycycline (VIBRAMYCIN) 100 MG capsule Take 100 mg by mouth daily. Patient not taking: Reported on 10/28/2022 10/17/22   [provider]  furosemide (LASIX) 40 MG tablet Take 1 tablet (40 mg total) by mouth daily. 05/08/20   Mercy Riding, MD  insulin aspart (NOVOLOG) 100 UNIT/ML injection CBG 70 - 120: 0 units CBG 121 - 150: 2 units CBG 151 - 200: 3 units CBG 201 - 250: 5 units CBG 251 - 300: 8 units CBG 301 - 350: 11 units CBG 351 - 400: 15 units CBG > 400: call MD and obtain STAT lab verification Patient not taking: Reported on 10/28/2022 05/08/20   Mercy Riding, MD  nutrition supplement, JUVEN, (JUVEN) PACK Take 1 packet by mouth 2 (two) times daily between  meals. Patient not taking: Reported on 10/28/2022 05/08/20   Mercy Riding, MD  potassium chloride (MICRO-K) 10 MEQ CR capsule Take 20 mEq by mouth daily. Patient not taking: Reported on 10/28/2022 10/23/22   [provider]    Social History   Socioeconomic History   Marital status: Divorced    Spouse name: Not on file   Number of children: Not on file   Years of education: Not on file   Highest education level: Not on file  Occupational History   Not on file  Tobacco Use   Smoking status: Former   Smokeless tobacco: Never  Vaping Use   Vaping Use: Never used  Substance and Sexual Activity   Alcohol use: No   Drug use: No   Sexual activity: Not Currently  Other Topics Concern   Not on  file  Social History Narrative   Not on file   Social Determinants of Health   Financial Resource Strain: Not on file  Food Insecurity: Not on file  Transportation Needs: Not on file  Physical Activity: Not on file  Stress: Not on file  Social Connections: Not on file  Intimate Partner Violence: Not on file   No family history on file.  ROS: Otherwise negative unless mentioned in HPI  Physical Examination  Vitals:   10/29/22 0410 10/29/22 0850  BP: (!) 127/56 (!) 173/78  Pulse: 78 87  Resp:  17  Temp: 98 F (36.7 C) 97.7 F (36.5 C)  SpO2: 100% 100%   Body mass index is 27.81 kg/m.  General:  WDWN in NAD Gait: Not observed HENT: WNL, normocephalic Pulmonary: normal non-labored breathing Cardiac: regular Abdomen: soft, NT/ND, no masses Skin: without rashes Vascular Exam/Pulses: 2+ femoral pulses Extremities: ischemic changes, without Gangrene , without cellulitis; without open wounds;  Musculoskeletal: no muscle wasting or atrophy  Neurologic: A&O X 3;  No focal weakness or paresthesias are detected; speech is fluent/normal Psychiatric:  The pt has Normal affect. Lymph:  Unremarkable  CBC    Component Value Date/Time   WBC 11.0 (H) 10/29/2022 0248   RBC  2.90 (L) 10/29/2022 0248   HGB 8.7 (L) 10/29/2022 0248   HCT 27.9 (L) 10/29/2022 0248   HCT 38.4 06/19/2019 0245   PLT 373 10/29/2022 0248   MCV 96.2 10/29/2022 0248   MCH 30.0 10/29/2022 0248   MCHC 31.2 10/29/2022 0248   RDW 14.8 10/29/2022 0248   LYMPHSABS 0.9 10/29/2022 0248   MONOABS 0.6 10/29/2022 0248   EOSABS 0.3 10/29/2022 0248   BASOSABS 0.1 10/29/2022 0248    BMET    Component Value Date/Time   NA 139 10/29/2022 0248   K 4.4 10/29/2022 0248   CL 113 (H) 10/29/2022 0248   CO2 19 (L) 10/29/2022 0248   GLUCOSE 94 10/29/2022 0248   BUN 56 (H) 10/29/2022 0248   CREATININE 2.13 (H) 10/29/2022 0248   CALCIUM 9.7 10/29/2022 0248   GFRNONAA 24 (L) 10/29/2022 0248   GFRAA 36 (L) 05/08/2020 0642    COAGS: Lab Results  Component Value Date   INR 1.1 10/28/2022   INR 1.42 05/06/2016   INR 1.15 01/05/2016   ASSESSMENT/PLAN: This is a 71 y.o. non ambulatory female with ischemic right foot wounds.  On exam, the foot does not appear salvageable.  Had a long conversation with artery regarding the above.  Being that she is nonambulatory, and the foot does not appear salvageable, I think she would be best served with a right-sided above-knee amputation.  Angela Hays was appreciative of the discussion and the honesty.  She was comfortable moving forward with above-knee amputation.  I plan to discuss this with her daughter, who is coming to visit her later today.  Likely plan for right above knee amputation Monday.  ______________________  I had a long discussion with Angela Hays daughter regarding the above.   Angela Hays was offered local wound care versus above-knee amputation. She would like to take the day and pray about it. I will continue my discussion with her tomorrow.   Cassandria Santee MD [facility] [phone] 10/29/2022  11:05 AM

## 2022-10-30 DIAGNOSIS — I998 Other disorder of circulatory system: Secondary | ICD-10-CM | POA: Diagnosis not present

## 2022-10-30 LAB — BASIC METABOLIC PANEL
Anion gap: 9 (ref 5–15)
BUN: 53 mg/dL — ABNORMAL HIGH (ref 8–23)
CO2: 17 mmol/L — ABNORMAL LOW (ref 22–32)
Calcium: 9.8 mg/dL (ref 8.9–10.3)
Chloride: 113 mmol/L — ABNORMAL HIGH (ref 98–111)
Creatinine, Ser: 1.94 mg/dL — ABNORMAL HIGH (ref 0.44–1.00)
GFR, Estimated: 27 mL/min — ABNORMAL LOW (ref >60)
Glucose, Bld: 125 mg/dL — ABNORMAL HIGH (ref 70–99)
Potassium: 4 mmol/L (ref 3.5–5.1)
Sodium: 139 mmol/L (ref 135–145)

## 2022-10-30 LAB — CBC
HCT: 29.2 % — ABNORMAL LOW (ref 36.0–46.0)
Hemoglobin: 8.9 g/dL — ABNORMAL LOW (ref 12.0–15.0)
MCH: 30.1 pg (ref 26.0–34.0)
MCHC: 30.5 g/dL (ref 30.0–36.0)
MCV: 98.6 fL (ref 80.0–100.0)
Platelets: 377 10*3/uL (ref 150–400)
RBC: 2.96 MIL/uL — ABNORMAL LOW (ref 3.87–5.11)
RDW: 14.9 % (ref 11.5–15.5)
WBC: 9.7 10*3/uL (ref 4.0–10.5)
nRBC: 0 % (ref 0.0–0.2)

## 2022-10-30 LAB — GLUCOSE, CAPILLARY
Glucose-Capillary: 133 mg/dL — ABNORMAL HIGH (ref 70–99)
Glucose-Capillary: 107 mg/dL — ABNORMAL HIGH (ref 70–99)
Glucose-Capillary: 133 mg/dL — ABNORMAL HIGH (ref 70–99)
Glucose-Capillary: 136 mg/dL — ABNORMAL HIGH (ref 70–99)
Glucose-Capillary: 186 mg/dL — ABNORMAL HIGH (ref 70–99)
Glucose-Capillary: 136 mg/dL — ABNORMAL HIGH (ref 70–99)

## 2022-10-30 MED ORDER — SODIUM CHLORIDE 0.9% FLUSH
10.0000 mL | Freq: Two times a day (BID) | INTRAVENOUS | Status: DC
  Administered 2022-10-30 – 2022-11-02 (×6): 10 mL
  Administered 2022-11-03: 30 mL
  Administered 2022-11-03 – 2022-11-04 (×2): 10 mL

## 2022-10-30 MED ORDER — SODIUM CHLORIDE 0.9% FLUSH: 10.0000 mL | INTRAVENOUS | Status: DC | PRN

## 2022-10-30 NOTE — Progress Notes (Signed)
  Daily Progress Note  Subjective: Smiling this morning, states right leg hurts less than yesterday  Objective: Vitals:   10/30/22 0738 10/30/22 1221  BP: (!) 171/72 (!) 159/80  Pulse: 91 82  Resp: 16 16  Temp: 98.6 F (37 C) 98.2 F (36.8 C)  SpO2: 100% 100%    Physical Examination Wounds on the right leg, palpable femoral pulses bilaterally  ASSESSMENT/PLAN:  Patient is a 70 year old nonambulatory female with significant wounds appreciated on the right lower extremity.  I had a long conversation with her and her daughter regarding options including medical management versus primary above-knee amputation as if she is no longer ambulatory, not a candidate for revascularization.  After discussing the risk and benefits, Angela Hays and her daughter both elected to pursue right-sided above-knee amputation.  This will take place tomorrow.  Please make n.p.o. midnight.   Cassandria Santee MD [facility] [phone] 10/30/2022  1:33 PM

## 2022-10-30 NOTE — Plan of Care (Signed)
  Problem: Education: Goal: Ability to describe self-care measures that may prevent or decrease complications (Diabetes Survival Skills Education) will improve Outcome: Progressing   Problem: Education: Goal: Knowledge of General Education information will improve Description: Including pain rating scale, medication(s)/side effects and non-pharmacologic comfort measures Outcome: Progressing   

## 2022-10-30 NOTE — Consult Note (Signed)
WOC Nurse Consult Note: Reason for Consult:left hip Stage 2 pressure injury Wound type:pressure Pressure Injury POA: Yes Measurement:2cm round x 0.1cm Per Bedside RN, S. Brown Wound PXT:GGYI, moist Drainage (amount, consistency, odor) scant serous Periwound:intact Dressing procedure/placement/frequency:Patient is to have an amputation to the right boot tomorrow with Vascular surgery (Dr. Virl Cagey). The left foot will be provided with a pressure redistribution heel boot. A sacral prophylactic foam dressing is to be placed and topical care to the left hip Stage 2 PI will be with a silicone foam changed daily. Turning and repositioning is in place.   [facility] nursing team will not follow, but will remain available to this patient, the nursing and medical teams.  Please re-consult if needed.  Thank you for inviting [place] to participate in this patient's Plan of Care.  Maudie Flakes, MSN, RN, CNS, [place], Serita Grammes, [employer], [employer] phone:  [phone]

## 2022-10-30 NOTE — Progress Notes (Signed)
HD#2 SUBJECTIVE:  Patient Summary: Angela Hays is a 71 y.o. with a pertinent PMH of type 2 diabetes, hypertension, CKD IV, HFpEF, CVA with residual left sided weakness, hyperlipidemia, depression, and gout, who presented with severe right foot pain and admitted for ischemic right foot wounds likely secondary to PAD.   Overnight Events: No acute events overnight.  Interim History: This is hospital day 2 for this patient who was seen and evaluated at the bedside this morning. She states that her pain is much better controlled today. She has been eating/drinking well, although her breakfast was cold this morning. No acute concerns.   OBJECTIVE:  Vital Signs: Vitals:   10/29/22 1715 10/29/22 2204 10/30/22 0548 10/30/22 0738  BP: 118/81 (!) 163/75 (!) 172/63 (!) 171/72  Pulse: 82 82 83 91  Resp: 18 18 18 16   Temp: 98.4 F (36.9 C) 98.6 F (37 C) 98.1 F (36.7 C) 98.6 F (37 C)  TempSrc: Oral Oral Oral Oral  SpO2: 96% 100% 100% 100%  Weight:      Height:       Supplemental O2: Room Air SpO2: 100 %  Filed Weights   10/28/22 1721  Weight: 71.2 kg     Intake/Output Summary (Last 24 hours) at 10/30/2022 0825 Last data filed at 10/30/2022 0549 Gross per 24 hour  Intake 1307.57 ml  Output 3150 ml  Net -1842.43 ml   Net IO Since Admission: -1,692.43 mL [10/30/22 0825]  Physical Exam: General: Pleasant, elderly female laying in bed. No acute distress. CV: RRR. No murmurs. Pulmonary: Lungs CTAB. Normal work of breathing.  Abdominal: Soft, nontender, nondistended. Extremities: Trace LE edema Skin: Right foot with black, necrotic lesion over big toe and lateral heel. Sloughing skin over right lower extremity with some erythema.  Neuro: A&Ox3. No focal deficit. Psych: Normal mood and affect      ASSESSMENT/PLAN:  Assessment: Active Problems:   Gangrene of right foot (HCC)   Plan: #Ischemic right foot wounds secondary to PAD ABIs one week ago showed no abnormality,  but I suspect that these results may have been misinterpreted.  Right lower extremity has evidence of necrosis in multiple areas and ulcers in nonweightbearing areas consistent with poor perfusion from peripheral artery disease.  Vascular surgery was consulted upon admission and is recommending an above-the-knee amputation.  There is no indication to do a CTA of the lower extremities at this point, given that the patient is non-ambulatory and would not be a candidate for any revascularization surgery. No need for MRI given that patient is having an AKA and this will not change management. Blood cultures still with no growth at 2 days. Leukocytosis resolved.  - Appreciate vascular surgery assistance, plan for AKA this week - Tylenol 1000 mg q8h  - Dilaudid 1 mg q3h prn for severe pain - Continue IV cefepime, vanc, and flagyl   #AKI on CKD IV Creatinine improved to 1.94, down from 2.4 on admission.  - Daily BMP - Avoid nephrotoxins  #Type 2 diabetes A1c 6.1%. CBGs well controlled, between 100-133 overnight and this morning. Holding long-acting insulin still, as patient was having episodes of hypoglycemia.  - Continue SSI with meals   #HFpEF #Hx of CVA #Hypertension - Continue labetalol 100 mg bid - Continue amlodipine 10 mg daily - Continue clonidine 0.3 mg bid - Continue hydralazine 25 mg q6h - Holding clopidogrel in the setting of upcoming AKA  Best Practice: Diet: Renal diet IVF: Fluids: none VTE: heparin injection 5,000 Units  Start: 10/28/22 2200 Code: Full AB: Vanc, cefepime, metronidazole Therapy Recs: Pending Family Contact: Daughter, to be notified. DISPO: Anticipated discharge to  [facility]  pending Medical stability.  Signature: Buddy Duty, D.O.  Internal Medicine Resident, PGY-2 [facility]  Pager: [phone] 8:25 AM, 10/30/2022   Please contact the on call pager after 5 pm and on weekends at [phone].

## 2022-10-31 ENCOUNTER — Other Ambulatory Visit: Payer: Self-pay | Admitting: *Deleted

## 2022-10-31 ENCOUNTER — Other Ambulatory Visit: Payer: Self-pay

## 2022-10-31 ENCOUNTER — Encounter (HOSPITAL_COMMUNITY): Payer: Self-pay | Admitting: Internal Medicine

## 2022-10-31 ENCOUNTER — Encounter (HOSPITAL_COMMUNITY)
Admission: EM | Disposition: A | Discharge status: Skilled Nursing Facility | Payer: Self-pay | Source: Skilled Nursing Facility | Attending: Internal Medicine

## 2022-10-31 ENCOUNTER — Inpatient Hospital Stay (HOSPITAL_COMMUNITY): Payer: Medicare Other | Admitting: Certified Registered Nurse Anesthetist

## 2022-10-31 DIAGNOSIS — L03115 Cellulitis of right lower limb: Secondary | ICD-10-CM | POA: Diagnosis not present

## 2022-10-31 DIAGNOSIS — I509 Heart failure, unspecified: Secondary | ICD-10-CM | POA: Diagnosis not present

## 2022-10-31 DIAGNOSIS — I998 Other disorder of circulatory system: Secondary | ICD-10-CM | POA: Diagnosis not present

## 2022-10-31 DIAGNOSIS — E1151 Type 2 diabetes mellitus with diabetic peripheral angiopathy without gangrene: Secondary | ICD-10-CM

## 2022-10-31 DIAGNOSIS — L97508 Non-pressure chronic ulcer of other part of unspecified foot with other specified severity: Secondary | ICD-10-CM

## 2022-10-31 DIAGNOSIS — I110 Hypertensive heart disease with heart failure: Secondary | ICD-10-CM

## 2022-10-31 DIAGNOSIS — I70261 Atherosclerosis of native arteries of extremities with gangrene, right leg: Secondary | ICD-10-CM

## 2022-10-31 DIAGNOSIS — Z87891 Personal history of nicotine dependence: Secondary | ICD-10-CM

## 2022-10-31 DIAGNOSIS — M7989 Other specified soft tissue disorders: Secondary | ICD-10-CM

## 2022-10-31 DIAGNOSIS — L97518 Non-pressure chronic ulcer of other part of right foot with other specified severity: Secondary | ICD-10-CM | POA: Diagnosis not present

## 2022-10-31 HISTORY — PX: AMPUTATION: SHX166

## 2022-10-31 LAB — BASIC METABOLIC PANEL
Anion gap: 9 (ref 5–15)
BUN: 47 mg/dL — ABNORMAL HIGH (ref 8–23)
CO2: 17 mmol/L — ABNORMAL LOW (ref 22–32)
Calcium: 9.4 mg/dL (ref 8.9–10.3)
Chloride: 113 mmol/L — ABNORMAL HIGH (ref 98–111)
Creatinine, Ser: 1.92 mg/dL — ABNORMAL HIGH (ref 0.44–1.00)
GFR, Estimated: 27 mL/min — ABNORMAL LOW (ref >60)
Glucose, Bld: 154 mg/dL — ABNORMAL HIGH (ref 70–99)
Potassium: 3.9 mmol/L (ref 3.5–5.1)
Sodium: 139 mmol/L (ref 135–145)

## 2022-10-31 LAB — CBC
HCT: 27 % — ABNORMAL LOW (ref 36.0–46.0)
Hemoglobin: 8.7 g/dL — ABNORMAL LOW (ref 12.0–15.0)
MCH: 30.7 pg (ref 26.0–34.0)
MCHC: 32.2 g/dL (ref 30.0–36.0)
MCV: 95.4 fL (ref 80.0–100.0)
Platelets: 392 10*3/uL (ref 150–400)
RBC: 2.83 MIL/uL — ABNORMAL LOW (ref 3.87–5.11)
RDW: 14.9 % (ref 11.5–15.5)
WBC: 10.3 10*3/uL (ref 4.0–10.5)
nRBC: 0 % (ref 0.0–0.2)

## 2022-10-31 LAB — GLUCOSE, CAPILLARY
Glucose-Capillary: 140 mg/dL — ABNORMAL HIGH (ref 70–99)
Glucose-Capillary: 132 mg/dL — ABNORMAL HIGH (ref 70–99)
Glucose-Capillary: 149 mg/dL — ABNORMAL HIGH (ref 70–99)
Glucose-Capillary: 145 mg/dL — ABNORMAL HIGH (ref 70–99)
Glucose-Capillary: 139 mg/dL — ABNORMAL HIGH (ref 70–99)

## 2022-10-31 LAB — SURGICAL PCR SCREEN
MRSA, PCR: NEGATIVE
Staphylococcus aureus: NEGATIVE

## 2022-10-31 SURGERY — AMPUTATION, ABOVE KNEE
Anesthesia: General | Site: Knee | Laterality: Right

## 2022-10-31 MED ORDER — DEXAMETHASONE SODIUM PHOSPHATE 10 MG/ML IJ SOLN
INTRAMUSCULAR | Status: AC
  Filled 2022-10-31: qty 1

## 2022-10-31 MED ORDER — MIDAZOLAM HCL 2 MG/2ML IJ SOLN
INTRAMUSCULAR | Status: AC
  Filled 2022-10-31: qty 2

## 2022-10-31 MED ORDER — LABETALOL HCL 5 MG/ML IV SOLN
INTRAVENOUS | Status: AC
  Filled 2022-10-31: qty 4

## 2022-10-31 MED ORDER — PROPOFOL 10 MG/ML IV BOLUS
INTRAVENOUS | Status: DC | PRN
  Administered 2022-10-31: 40 mg via INTRAVENOUS
  Administered 2022-10-31: 100 mg via INTRAVENOUS
  Administered 2022-10-31: 10 mg via INTRAVENOUS

## 2022-10-31 MED ORDER — ROCURONIUM BROMIDE 10 MG/ML (PF) SYRINGE
PREFILLED_SYRINGE | INTRAVENOUS | Status: AC
  Filled 2022-10-31: qty 20

## 2022-10-31 MED ORDER — ONDANSETRON HCL 4 MG/2ML IJ SOLN
INTRAMUSCULAR | Status: AC
  Filled 2022-10-31: qty 2

## 2022-10-31 MED ORDER — ORAL CARE MOUTH RINSE: 15.0000 mL | Freq: Once | OROMUCOSAL | Status: AC

## 2022-10-31 MED ORDER — ROCURONIUM BROMIDE 10 MG/ML (PF) SYRINGE
PREFILLED_SYRINGE | INTRAVENOUS | Status: DC | PRN
  Administered 2022-10-31: 40 mg via INTRAVENOUS
  Administered 2022-10-31: 20 mg via INTRAVENOUS

## 2022-10-31 MED ORDER — PHENYLEPHRINE 80 MCG/ML (10ML) SYRINGE FOR IV PUSH (FOR BLOOD PRESSURE SUPPORT)
PREFILLED_SYRINGE | INTRAVENOUS | Status: DC | PRN
  Administered 2022-10-31 (×2): 80 ug via INTRAVENOUS

## 2022-10-31 MED ORDER — OXYCODONE HCL 5 MG/5ML PO SOLN: 5.0000 mg | Freq: Once | ORAL | Status: DC | PRN

## 2022-10-31 MED ORDER — PROPOFOL 10 MG/ML IV BOLUS
INTRAVENOUS | Status: AC
  Filled 2022-10-31: qty 20

## 2022-10-31 MED ORDER — ACETAMINOPHEN 500 MG PO TABS: 1000.0000 mg | ORAL_TABLET | Freq: Once | ORAL | Status: DC

## 2022-10-31 MED ORDER — LIDOCAINE 2% (20 MG/ML) 5 ML SYRINGE
INTRAMUSCULAR | Status: AC
  Filled 2022-10-31: qty 5

## 2022-10-31 MED ORDER — LACTATED RINGERS IV SOLN: INTRAVENOUS | Status: DC

## 2022-10-31 MED ORDER — PHENYLEPHRINE 80 MCG/ML (10ML) SYRINGE FOR IV PUSH (FOR BLOOD PRESSURE SUPPORT)
PREFILLED_SYRINGE | INTRAVENOUS | Status: AC
  Filled 2022-10-31: qty 10

## 2022-10-31 MED ORDER — FENTANYL CITRATE (PF) 100 MCG/2ML IJ SOLN: 25.0000 ug | INTRAMUSCULAR | Status: DC | PRN

## 2022-10-31 MED ORDER — DEXAMETHASONE SODIUM PHOSPHATE 10 MG/ML IJ SOLN
INTRAMUSCULAR | Status: DC | PRN
  Administered 2022-10-31: 4 mg via INTRAVENOUS

## 2022-10-31 MED ORDER — SUGAMMADEX SODIUM 200 MG/2ML IV SOLN
INTRAVENOUS | Status: DC | PRN
  Administered 2022-10-31 (×2): 100 mg via INTRAVENOUS

## 2022-10-31 MED ORDER — CEFAZOLIN SODIUM 1 G IJ SOLR
INTRAMUSCULAR | Status: AC
  Filled 2022-10-31: qty 40

## 2022-10-31 MED ORDER — CHLORHEXIDINE GLUCONATE 0.12 % MT SOLN
15.0000 mL | Freq: Once | OROMUCOSAL | Status: AC
  Administered 2022-10-31: 15 mL via OROMUCOSAL

## 2022-10-31 MED ORDER — INSULIN ASPART 100 UNIT/ML IJ SOLN: 0.0000 [IU] | INTRAMUSCULAR | Status: DC | PRN

## 2022-10-31 MED ORDER — OXYCODONE HCL 5 MG PO TABS: 5.0000 mg | ORAL_TABLET | Freq: Once | ORAL | Status: DC | PRN

## 2022-10-31 MED ORDER — FENTANYL CITRATE (PF) 250 MCG/5ML IJ SOLN
INTRAMUSCULAR | Status: AC
  Filled 2022-10-31: qty 5

## 2022-10-31 MED ORDER — LIDOCAINE 2% (20 MG/ML) 5 ML SYRINGE
INTRAMUSCULAR | Status: DC | PRN
  Administered 2022-10-31: 80 mg via INTRAVENOUS

## 2022-10-31 MED ORDER — PHENYLEPHRINE HCL-NACL 20-0.9 MG/250ML-% IV SOLN
INTRAVENOUS | Status: DC | PRN
  Administered 2022-10-31: 40 ug/min via INTRAVENOUS

## 2022-10-31 MED ORDER — HEPARIN SODIUM (PORCINE) 1000 UNIT/ML IJ SOLN
INTRAMUSCULAR | Status: AC
  Filled 2022-10-31: qty 1

## 2022-10-31 MED ORDER — ONDANSETRON HCL 4 MG/2ML IJ SOLN
INTRAMUSCULAR | Status: DC | PRN
  Administered 2022-10-31: 4 mg via INTRAVENOUS

## 2022-10-31 MED ORDER — CEFAZOLIN SODIUM-DEXTROSE 2-3 GM-%(50ML) IV SOLR
INTRAVENOUS | Status: DC | PRN
  Administered 2022-10-31: 2 g via INTRAVENOUS

## 2022-10-31 MED ORDER — 0.9 % SODIUM CHLORIDE (POUR BTL) OPTIME
TOPICAL | Status: DC | PRN
  Administered 2022-10-31: 500 mL
  Administered 2022-10-31: 1000 mL

## 2022-10-31 MED ORDER — STERILE WATER FOR IRRIGATION IR SOLN
Status: DC | PRN
  Administered 2022-10-31: 1000 mL

## 2022-10-31 MED ORDER — VANCOMYCIN HCL 500 MG/100ML IV SOLN
500.0000 mg | INTRAVENOUS | Status: DC
  Filled 2022-10-31: qty 100

## 2022-10-31 MED ORDER — PROTAMINE SULFATE 10 MG/ML IV SOLN
INTRAVENOUS | Status: AC
  Filled 2022-10-31: qty 5

## 2022-10-31 MED ORDER — FENTANYL CITRATE (PF) 250 MCG/5ML IJ SOLN
INTRAMUSCULAR | Status: DC | PRN
  Administered 2022-10-31: 50 ug via INTRAVENOUS
  Administered 2022-10-31 (×2): 25 ug via INTRAVENOUS

## 2022-10-31 MED ORDER — SODIUM CHLORIDE (PF) 0.9 % IJ SOLN
INTRAMUSCULAR | Status: AC
  Filled 2022-10-31: qty 20

## 2022-10-31 SURGICAL SUPPLY — 63 items
BAG COUNTER SPONGE SURGICOUNT (BAG) ×1 IMPLANT
BAG SPNG CNTER NS LX DISP (BAG) ×1
BANDAGE ESMARK 6X9 LF (GAUZE/BANDAGES/DRESSINGS) ×1 IMPLANT
BLADE SAW SGTL 73X25 THK (BLADE) ×1 IMPLANT
BNDG CMPR 9X6 STRL LF SNTH (GAUZE/BANDAGES/DRESSINGS) ×1
BNDG COHESIVE 6X5 TAN STRL LF (GAUZE/BANDAGES/DRESSINGS) ×1 IMPLANT
BNDG ELASTIC 4X5.8 VLCR STR LF (GAUZE/BANDAGES/DRESSINGS) ×1 IMPLANT
BNDG ELASTIC 6X5.8 VLCR STR LF (GAUZE/BANDAGES/DRESSINGS) ×1 IMPLANT
BNDG ESMARK 6X9 LF (GAUZE/BANDAGES/DRESSINGS) ×1
BNDG GAUZE DERMACEA FLUFF 4 (GAUZE/BANDAGES/DRESSINGS) ×2 IMPLANT
BNDG GZE DERMACEA 4 6PLY (GAUZE/BANDAGES/DRESSINGS) ×2
CANISTER SUCT 3000ML PPV (MISCELLANEOUS) ×1 IMPLANT
CLIP LIGATING EXTRA MED SLVR (CLIP) ×1 IMPLANT
CLIP LIGATING EXTRA SM BLUE (MISCELLANEOUS) ×1 IMPLANT
CLIP TI LARGE 6 (CLIP) IMPLANT
CLIP VESOCCLUDE MED 6/CT (CLIP) ×1 IMPLANT
COVER BACK TABLE 60X90IN (DRAPES) ×1 IMPLANT
COVER SURGICAL LIGHT HANDLE (MISCELLANEOUS) ×2 IMPLANT
CUFF TOURN SGL QUICK 18X4 (TOURNIQUET CUFF) IMPLANT
DRAPE HALF SHEET 40X57 (DRAPES) ×1 IMPLANT
DRAPE INCISE 23X17 IOBAN STRL (DRAPES) ×1
DRAPE INCISE 23X17 STRL (DRAPES) ×1 IMPLANT
DRAPE INCISE IOBAN 23X17 STRL (DRAPES) ×1 IMPLANT
DRAPE INCISE IOBAN 66X45 STRL (DRAPES) ×1 IMPLANT
DRAPE ORTHO SPLIT 77X108 STRL (DRAPES) ×2
DRAPE SURG ORHT 6 SPLT 77X108 (DRAPES) ×2 IMPLANT
DRAPE U-SHAPE 47X51 STRL (DRAPES) IMPLANT
DRSG ADAPTIC 3X8 NADH LF (GAUZE/BANDAGES/DRESSINGS) ×1 IMPLANT
ELECT CAUTERY BLADE 6.4 (BLADE) ×1 IMPLANT
ELECT REM PT RETURN 9FT ADLT (ELECTROSURGICAL) ×1
ELECTRODE REM PT RTRN 9FT ADLT (ELECTROSURGICAL) ×1 IMPLANT
EVACUATOR SILICONE 100CC (DRAIN) IMPLANT
GAUZE PAD ABD 8X10 STRL (GAUZE/BANDAGES/DRESSINGS) IMPLANT
GAUZE SPONGE 4X4 12PLY STRL (GAUZE/BANDAGES/DRESSINGS) ×2 IMPLANT
GAUZE XEROFORM 5X9 LF (GAUZE/BANDAGES/DRESSINGS) IMPLANT
GLOVE BIO SURGEON STRL SZ7.5 (GLOVE) ×1 IMPLANT
GLOVE BIOGEL PI IND STRL 8 (GLOVE) ×1 IMPLANT
GLOVE SRG 8 PF TXTR STRL LF DI (GLOVE) ×1 IMPLANT
GLOVE SURG POLYISO LF SZ8 (GLOVE) IMPLANT
GLOVE SURG SS PI 6.0 STRL IVOR (GLOVE) IMPLANT
GLOVE SURG SS PI 7.0 STRL IVOR (GLOVE) IMPLANT
GLOVE SURG UNDER POLY LF SZ8 (GLOVE) ×1
GOWN STRL REUS W/TWL 2XL LVL3 (GOWN DISPOSABLE) ×2 IMPLANT
KIT BASIN OR (CUSTOM PROCEDURE TRAY) ×1 IMPLANT
KIT TURNOVER KIT B (KITS) ×1 IMPLANT
NS IRRIG 1000ML POUR BTL (IV SOLUTION) ×1 IMPLANT
PACK GENERAL/GYN (CUSTOM PROCEDURE TRAY) ×1 IMPLANT
PAD ARMBOARD 7.5X6 YLW CONV (MISCELLANEOUS) ×2 IMPLANT
SPONGE T-LAP 18X18 ~~LOC~~+RFID (SPONGE) IMPLANT
STAPLER VISISTAT 35W (STAPLE) ×1 IMPLANT
STOCKINETTE IMPERVIOUS LG (DRAPES) ×1 IMPLANT
SUT SILK 0 TIES 10X30 (SUTURE) ×1 IMPLANT
SUT SILK 2 0 (SUTURE) ×1
SUT SILK 2 0 SH CR/8 (SUTURE) ×1 IMPLANT
SUT SILK 2-0 18XBRD TIE 12 (SUTURE) ×1 IMPLANT
SUT VIC AB 0 CT1 27 (SUTURE) ×1
SUT VIC AB 0 CT1 27XBRD ANBCTR (SUTURE) ×1 IMPLANT
SUT VIC AB 2-0 CT1 18 (SUTURE) ×2 IMPLANT
SUT VIC AB 3-0 SH 18 (SUTURE) IMPLANT
TOWEL GREEN STERILE (TOWEL DISPOSABLE) ×2 IMPLANT
TUBE CONNECTING 20X1/4 (TUBING) IMPLANT
UNDERPAD 30X36 HEAVY ABSORB (UNDERPADS AND DIAPERS) ×1 IMPLANT
WATER STERILE IRR 1000ML POUR (IV SOLUTION) ×1 IMPLANT

## 2022-10-31 NOTE — Transfer of Care (Signed)
Immediate Anesthesia Transfer of Care Note  Patient: Angela Hays  Procedure(s) Performed: RIGHT AMPUTATION ABOVE KNEE (Right: Knee)  Patient Location: PACU  Anesthesia Type:General  Level of Consciousness: awake and patient cooperative  Airway & Oxygen Therapy: Patient Spontanous Breathing and Patient connected to face mask oxygen  Post-op Assessment: Report given to RN and Post -op Vital signs reviewed and stable  Post vital signs: Reviewed and stable  Last Vitals:  Vitals Value Taken Time  BP    Temp    Pulse    Resp    SpO2      Last Pain:  Vitals:   10/31/22 0909  TempSrc:   PainSc: 5       Patients Stated Pain Goal: 3 (83/66/29 4765)  Complications: No notable events documented.

## 2022-10-31 NOTE — Anesthesia Postprocedure Evaluation (Signed)
Anesthesia Post Note  Patient: Angela Hays  Procedure(s) Performed: RIGHT AMPUTATION ABOVE KNEE (Right: Knee)     Patient location during evaluation: PACU Anesthesia Type: General Level of consciousness: awake and alert Pain management: pain level controlled Vital Signs Assessment: post-procedure vital signs reviewed and stable Respiratory status: spontaneous breathing, nonlabored ventilation and respiratory function stable Cardiovascular status: blood pressure returned to baseline Postop Assessment: no apparent nausea or vomiting Anesthetic complications: no   No notable events documented.  Last Vitals:  Vitals:   10/31/22 1215 10/31/22 1233  BP: (!) 140/65 (!) 158/70  Pulse: 73 77  Resp: 17 16  Temp: 36.6 C 36.6 C  SpO2: 91% 98%    Last Pain:  Vitals:   10/31/22 1233  TempSrc: Oral  PainSc:                  Marthenia Rolling

## 2022-10-31 NOTE — Progress Notes (Addendum)
HD#3 Subjective:   Summary: Angela Hays is a 71 year old female with a past medical history of type 2 diabetes, hypertension, CKD stage IV, HFpEF, CVA with residual left-sided weakness, HLD, depression, and gout who presented to the ED with 4 days of severe right foot pain and is admitted for cellulitis of the right foot with necrosis 2/2 to PAD.   Overnight Events: None   Pt is doing well this morning with some stable right heel pain. She is in the periop area waiting for surgery. She has no new or worsening complaints.   Objective:  Vital signs in last 24 hours: Vitals:   10/30/22 1221 10/30/22 1731 10/30/22 2126 10/31/22 0558  BP: (!) 159/80 (!) 143/66 (!) 152/67 (!) 126/50  Pulse: 82  100 84  Resp: 16  17 17   Temp: 98.2 F (36.8 C)  98.3 F (36.8 C) 98 F (36.7 C)  TempSrc: Oral  Oral Oral  SpO2: 100%  97% 100%  Weight:      Height:       Supplemental O2: Room Air SpO2: 100 %   Physical Exam:  Constitutional: Elderly female sitting in bed, in no acute distress Cardiovascular: regular rate and rhythm Pulmonary/Chest: normal work of breathing on room air MSK: 2-3+ pitting lower extremity edema bilaterally below the knees with skin sloughing and loss of hair below the knees as well.  Erythema noted surrounding necrotic right first toe and extending up the foot to the mid shin.  Multiple necrotic appearing ulcers in the right foot without drainage. Neurological: alert & oriented x 3 Skin: warm and dry  Filed Weights   10/28/22 1721  Weight: 71.2 kg     Intake/Output Summary (Last 24 hours) at 10/31/2022 0726 Last data filed at 10/31/2022 0559 Gross per 24 hour  Intake 747.83 ml  Output 1550 ml  Net -802.17 ml   Net IO Since Admission: -2,494.6 mL [10/31/22 0726]  Pertinent Labs:    Latest Ref Rng & Units 10/31/2022    2:49 AM 10/30/2022    2:41 AM 10/29/2022    2:48 AM  CBC  WBC 4.0 - 10.5 K/uL 10.3  9.7  11.0   Hemoglobin 12.0 - 15.0 g/dL 8.7  8.9  8.7    Hematocrit 36.0 - 46.0 % 27.0  29.2  27.9   Platelets 150 - 400 K/uL 392  377  373        Latest Ref Rng & Units 10/31/2022    2:49 AM 10/30/2022    2:41 AM 10/29/2022    2:48 AM  CMP  Glucose 70 - 99 mg/dL 154  125  94   BUN 8 - 23 mg/dL 47  53  56   Creatinine 0.44 - 1.00 mg/dL 1.92  1.94  2.13   Sodium 135 - 145 mmol/L 139  139  139   Potassium 3.5 - 5.1 mmol/L 3.9  4.0  4.4   Chloride 98 - 111 mmol/L 113  113  113   CO2 22 - 32 mmol/L 17  17  19    Calcium 8.9 - 10.3 mg/dL 9.4  9.8  9.7     Imaging: No results found.  Assessment/Plan:   Active Problems:   Gangrene of right foot [facility])   Patient Summary: Angela Hays is a 71 y.o. with a pertinent PMH of type 2 diabetes, hypertension, CKD stage IV, HFpEF, CVA with residual left-sided weakness, HLD, depression, and gout who presented to the ED with 4 days  of severe right foot pain and is admitted for cellulitis of the right foot.     Nonhealing ulcer of the right foot toe, likely necrotic Cellulitis of the right lower extremity Poor peripheral perfusion likely secondary to PAD and chronic venous insufficiency No worsening of pain or status overnight. Preparing for surgery in the periop area. Blood cultures negative at 3 days. Will continue to work with vascular for recovery.  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 vancomycin (11/2) and cefepime/metronidazole (11/3)   Prerenal AKI on CKD stage IV  Stable at 1.9.  - Encourage p.o. intake and we will continue to monitor   Type 2 diabetes mellitus A1c here of 6.1.  Home regimen of Januvia, Lantus, Humalog.  - SSI, plan to start Semglee 10 units at bedtime if sugars tolerate   HFpEF Hypertension History of CVA with left-sided paresis Stable symptoms and no current signs of exacerbation. We will consider consolidating her antihypertensives after surgery.  - Continue labetalol 100 mg twice daily, amlodipine 10 mg daily, clonidine 0.3 mg twice daily, and hydralazine  25 mg every 6 hours - Hold clopidogrel   Depression Continue Zoloft 25 mg daily  Diet: NPO IVF: None VTE: None Code: Full   Dispo: Anticipated discharge to back to [facility] pending post op recovery.   [facility] Resident PGY-1 Pager: [phone]  Please contact the on call pager after 5 pm and on weekends at [phone].

## 2022-10-31 NOTE — Progress Notes (Signed)
  Daily Progress Note  Subjective: Smiling this morning. Pain under control  Objective: Vitals:   10/31/22 0739 10/31/22 0841  BP: (!) 140/60 (!) 139/57  Pulse: 89 84  Resp: 16 18  Temp: 97.8 F (36.6 C) 97.9 F (36.6 C)  SpO2: 100% 94%    Physical Examination Wounds on the right leg, palpable femoral pulses bilaterally  ASSESSMENT/PLAN:  Patient is a 71 year old nonambulatory female with significant wounds appreciated on the right lower extremity.  I had a long conversation with her and her daughter regarding options including medical management versus primary above-knee amputation as if she is no longer ambulatory, not a candidate for revascularization.  After discussing the risk and benefits, Angela Hays and her daughter both elected to pursue right-sided above-knee amputation.     Cassandria Santee MD MS [facility] [phone] 10/31/2022  9:30 AM

## 2022-10-31 NOTE — Anesthesia Preprocedure Evaluation (Addendum)
Anesthesia Evaluation  Patient identified by MRN, date of birth, ID band Patient awake    Reviewed: Allergy & Precautions, NPO status , Patient's Chart, lab work & pertinent test results  History of Anesthesia Complications Negative for: history of anesthetic complications  Airway Mallampati: II  TM Distance: >3 FB Neck ROM: Full    Dental  (+) Edentulous Lower, Edentulous Upper   Pulmonary former smoker   Pulmonary exam normal        Cardiovascular hypertension, + Peripheral Vascular Disease and +CHF  Normal cardiovascular exam     Neuro/Psych CVA  negative psych ROS   GI/Hepatic negative GI ROS, Neg liver ROS,,,  Endo/Other  diabetes, Type 2    Renal/GU Renal disease (Cr 1.92)  negative genitourinary   Musculoskeletal negative musculoskeletal ROS (+)    Abdominal   Peds  Hematology  (+) Blood dyscrasia (Hgb 8.7), anemia   Anesthesia Other Findings Day of surgery medications reviewed with patient.  Reproductive/Obstetrics negative OB ROS                             Anesthesia Physical Anesthesia Plan  ASA: 3  Anesthesia Plan:    Post-op Pain Management: Tylenol PO (pre-op)*   Induction: Intravenous  PONV Risk Score and Plan: 2 and Treatment may vary due to age or medical condition, Ondansetron and Dexamethasone  Airway Management Planned: LMA  Additional Equipment: None  Intra-op Plan:   Post-operative Plan: Extubation in OR  Informed Consent: I have reviewed the patients History and Physical, chart, labs and discussed the procedure including the risks, benefits and alternatives for the proposed anesthesia with the patient or authorized representative who has indicated his/her understanding and acceptance.     Dental advisory given  Plan Discussed with: CRNA  Anesthesia Plan Comments:         Anesthesia Quick Evaluation

## 2022-10-31 NOTE — Progress Notes (Signed)
Orthopedic Tech Progress Note Patient Details:  Angela Hays 1951-06-30 [id number]  Called in order to [facility] for an [place]   Patient ID: Angela Hays, female   DOB: [birth year], 71 y.o.   MRN: [id number]  [name] 10/31/2022, 12:14 PM

## 2022-10-31 NOTE — Anesthesia Procedure Notes (Signed)
Procedure Name: Intubation Date/Time: 10/31/2022 10:15 AM  Performed by: Janene Harvey, CRNAPre-anesthesia Checklist: Patient identified, Emergency Drugs available, Suction available and Patient being monitored Patient Re-evaluated:Patient Re-evaluated prior to induction Oxygen Delivery Method: Circle system utilized Preoxygenation: Pre-oxygenation with 100% oxygen Induction Type: IV induction Ventilation: Two handed mask ventilation required and Oral airway inserted - appropriate to patient size Laryngoscope Size: Mac and 4 Grade View: Grade I Tube type: Oral Tube size: 7.5 mm Number of attempts: 1 Airway Equipment and Method: Stylet and Oral airway Placement Confirmation: ETT inserted through vocal cords under direct vision, positive ETCO2 and breath sounds checked- equal and bilateral Secured at: 22 cm Tube secured with: Tape Dental Injury: Teeth and Oropharynx as per pre-operative assessment

## 2022-10-31 NOTE — Op Note (Signed)
    NAME: Angela Hays    MRN: [id number] DOB: [birth year]    DATE OF OPERATION: 10/31/2022  PREOP DIAGNOSIS:    Right leg tissue loss  POSTOP DIAGNOSIS:    Same  PROCEDURE:    Right above-knee amputation  SURGEON: Broadus John  ASSIST: Luisa Dago, PA  ANESTHESIA: General  EBL: 200  INDICATIONS:    Angela Hays is a nonambulatory 71 y.o. female who presented with right lower extremity tissue loss.  After discussing the risk and benefits of continued medical management versus right above-knee amputation, [name] and her daughter elected to pursue amputation.  FINDINGS:   Healthy, viable muscle in the thigh  TECHNIQUE:   Patient was brought to the OR laid in the supine position.  General anesthesia was induced and the patient was prepped and draped in standard fashion.  A timeout was performed.  A fish-mouth incision line was drawn 7cm from the patella for the RIGHT above-knee amputation..  A tourniquet was placed on the proximal thigh and inflated to 300 mmHg.  Sharp dissection was used to the femur.  Thigh muscles were divided and femoral artery and vein identified.  These were oversewn using two, 2-0 silk pop stick ties and buttressed with a large clip.  Once the circumferential exposure of the femur was complete, a reciprocating saw was used to divide the femur. This was followed by amputation knife through the back of the thigh.  Next, a periosteal elevator and cautery were used to expose 5cm more of the proximal femur.  Rakes were used to hold the thigh muscles and the reciprocating saw was once again used to divide the femur.   The tourniquet was released and bleeding vessels oversewn using 2-0 silk suture. The sciatic nerve was identified, pulled to length and ligated with a zero silk tie. Once hemostasis was achieved, warm saline was brought to the field and the wound bed was irrigated. There was no bleeding from the marrow. It appeared unhealthy.  Next, my  attention turned to closure of the fascial layer.  This was done using 2-0 Vicryl pop suture.  The fascial layer was closed along the entirety of the incision.  A stapler was brought to the field to oppose the dermis.  An occlusive dressing was placed and the patient was taken the PACU in stable condition.  Given the complexity of the case a first assistant was necessary in order to expedient the procedure and safely perform the technical aspects of the operation.   Macie Burows, MD [facility] DATE OF DICTATION:   10/31/2022

## 2022-10-31 NOTE — Plan of Care (Signed)
  Problem: Education: Goal: Knowledge of General Education information will improve Description Including pain rating scale, medication(s)/side effects and non-pharmacologic comfort measures Outcome: Progressing   Problem: Health Behavior/Discharge Planning: Goal: Ability to manage health-related needs will improve Outcome: Progressing   

## 2022-10-31 NOTE — Progress Notes (Addendum)
Pharmacy Antibiotic Note  Angela Hays is a 71 y.o. female admitted on 10/28/2022 with RLE ischemic wounds planning AKA 11/6.  Pharmacy has been consulted for vancomycin dosing. Patient is also on cefepime and metronidazole.    11/6 Vancomycin 500mg  Q 24 hr Scr used: 1.92 mg/dL Weight: 71.2 kg Vd coeff: 0.72 L/kg Est AUC: 437  Plan: Change Vancomycin to 500mg  q24hr Continue cefepime and metronidazole per team Monitor cultures, clinical status, renal function, vancomycin level F/u abx duration post AKA   ADDENDUM 1400 - Now s/p R AKA. Continue antibiotics for 2 days postop per team. End date 11/8 ordered    Height: 5\' 3"  (160 cm) Weight: 71.2 kg (157 lb) IBW/kg (Calculated) : 52.4  Temp (24hrs), Avg:98.1 F (36.7 C), Min:97.8 F (36.6 C), Max:98.3 F (36.8 C)  Recent Labs  Lab 10/28/22 1751 10/28/22 2019 10/29/22 0248 10/30/22 0241 10/31/22 0249  WBC 13.5*  --  11.0* 9.7 10.3  CREATININE 2.42*  --  2.13* 1.94* 1.92*  LATICACIDVEN 0.9 0.6  --   --   --     Estimated Creatinine Clearance: 24.7 mL/min (A) (by C-G formula based on SCr of 1.92 mg/dL (H)).    No Known Allergies  Antimicrobials this admission: Cefe 11/4 >> 11/8  MTZ 11/4  >> 11/8 Vanc 11/4 >> 11/8  Dose adjustments this admission: N/a  Microbiology results: 11/3 BCx: ngtd  11/6 MRSA PCR: pend  Thank you for allowing pharmacy to be a part of this patient's care.  Benetta Spar, PharmD, BCPS, BCCP Clinical Pharmacist  Please check AMION for all [place] phone numbers After 10:00 PM, call [place] [phone]

## 2022-11-01 ENCOUNTER — Encounter (HOSPITAL_COMMUNITY): Payer: Self-pay | Admitting: Vascular Surgery

## 2022-11-01 DIAGNOSIS — Z794 Long term (current) use of insulin: Secondary | ICD-10-CM

## 2022-11-01 DIAGNOSIS — E11628 Type 2 diabetes mellitus with other skin complications: Secondary | ICD-10-CM | POA: Diagnosis not present

## 2022-11-01 DIAGNOSIS — L089 Local infection of the skin and subcutaneous tissue, unspecified: Secondary | ICD-10-CM

## 2022-11-01 DIAGNOSIS — N179 Acute kidney failure, unspecified: Secondary | ICD-10-CM | POA: Diagnosis not present

## 2022-11-01 DIAGNOSIS — Z89611 Acquired absence of right leg above knee: Secondary | ICD-10-CM | POA: Diagnosis not present

## 2022-11-01 DIAGNOSIS — M79671 Pain in right foot: Secondary | ICD-10-CM | POA: Diagnosis not present

## 2022-11-01 LAB — GLUCOSE, CAPILLARY
Glucose-Capillary: 121 mg/dL — ABNORMAL HIGH (ref 70–99)
Glucose-Capillary: 150 mg/dL — ABNORMAL HIGH (ref 70–99)
Glucose-Capillary: 225 mg/dL — ABNORMAL HIGH (ref 70–99)
Glucose-Capillary: 159 mg/dL — ABNORMAL HIGH (ref 70–99)

## 2022-11-01 LAB — BASIC METABOLIC PANEL
Anion gap: 6 (ref 5–15)
BUN: 45 mg/dL — ABNORMAL HIGH (ref 8–23)
CO2: 18 mmol/L — ABNORMAL LOW (ref 22–32)
Calcium: 9.5 mg/dL (ref 8.9–10.3)
Chloride: 116 mmol/L — ABNORMAL HIGH (ref 98–111)
Creatinine, Ser: 1.66 mg/dL — ABNORMAL HIGH (ref 0.44–1.00)
GFR, Estimated: 32 mL/min — ABNORMAL LOW (ref >60)
Glucose, Bld: 110 mg/dL — ABNORMAL HIGH (ref 70–99)
Potassium: 3.9 mmol/L (ref 3.5–5.1)
Sodium: 140 mmol/L (ref 135–145)

## 2022-11-01 LAB — CBC
HCT: 25.8 % — ABNORMAL LOW (ref 36.0–46.0)
Hemoglobin: 8.2 g/dL — ABNORMAL LOW (ref 12.0–15.0)
MCH: 31.1 pg (ref 26.0–34.0)
MCHC: 31.8 g/dL (ref 30.0–36.0)
MCV: 97.7 fL (ref 80.0–100.0)
Platelets: 365 10*3/uL (ref 150–400)
RBC: 2.64 MIL/uL — ABNORMAL LOW (ref 3.87–5.11)
RDW: 15.1 % (ref 11.5–15.5)
WBC: 11.7 10*3/uL — ABNORMAL HIGH (ref 4.0–10.5)
nRBC: 0 % (ref 0.0–0.2)

## 2022-11-01 MED ORDER — CLOPIDOGREL BISULFATE 75 MG PO TABS
75.0000 mg | ORAL_TABLET | Freq: Every day | ORAL | Status: DC
  Administered 2022-11-02 – 2022-11-04 (×3): 75 mg via ORAL
  Filled 2022-11-01 (×3): qty 1

## 2022-11-01 MED ORDER — VANCOMYCIN HCL IN DEXTROSE 1-5 GM/200ML-% IV SOLN
1000.0000 mg | INTRAVENOUS | Status: AC
  Administered 2022-11-01: 1000 mg via INTRAVENOUS
  Filled 2022-11-01 (×3): qty 200

## 2022-11-01 MED ORDER — ACETAMINOPHEN 500 MG PO TABS: 1000.0000 mg | ORAL_TABLET | Freq: Three times a day (TID) | ORAL | Status: DC | PRN

## 2022-11-01 NOTE — Progress Notes (Signed)
Pharmacy Antibiotic Note  Angela Hays is a 71 y.o. female admitted on 10/28/2022 with RLE ischemic wounds planning AKA 11/6.  Pharmacy has been consulted for vancomycin dosing. Patient is also on cefepime and metronidazole.    Renal function improving. Continue antibiotics until 11/8 per teams.  11/7 Vancomycin 1000mg  Q 36 hr Scr used: 1.66 mg/dL Weight: 72 kg Vd coeff: 0.72 L/kg Est AUC: 512   Plan: Change Vancomycin to 1000mg  q 36 hr x1  Cefepime and metronidazole stop 11/8    Height: 5\' 3"  (160 cm) Weight: 72 kg (158 lb 11.7 oz) IBW/kg (Calculated) : 52.4  Temp (24hrs), Avg:98.1 F (36.7 C), Min:97.8 F (36.6 C), Max:99 F (37.2 C)  Recent Labs  Lab 10/28/22 1751 10/28/22 2019 10/29/22 0248 10/30/22 0241 10/31/22 0249 11/01/22 0337  WBC 13.5*  --  11.0* 9.7 10.3 11.7*  CREATININE 2.42*  --  2.13* 1.94* 1.92* 1.66*  LATICACIDVEN 0.9 0.6  --   --   --   --      Estimated Creatinine Clearance: 28.7 mL/min (A) (by C-G formula based on SCr of 1.66 mg/dL (H)).    No Known Allergies  Antimicrobials this admission: Cefe 11/4 >> 11/8  MTZ 11/4  >> 11/8 Vanc 11/4 >> 11/8  Dose adjustments this admission: N/a  Microbiology results: 11/3 BCx: ngtd  11/6 MRSA PCR: pend  Thank you for allowing pharmacy to be a part of this patient's care.  Benetta Spar, PharmD, BCPS, BCCP Clinical Pharmacist  Please check AMION for all [place] phone numbers After 10:00 PM, call [place] [phone]

## 2022-11-01 NOTE — Progress Notes (Signed)
HD#4 Subjective:   Summary: Angela Hays is a 71 year old female with a past medical history of type 2 diabetes, hypertension, CKD stage IV, HFpEF, CVA with residual left-sided weakness, HLD, depression, and gout who presented to the ED with 4 days of severe right foot pain and is admitted for cellulitis of the right foot with necrosis 2/2 to PAD.    Overnight Events: None   Pt is doing very well this morning with no complaints. She denies any significant pain in her legs.  Objective:  Vital signs in last 24 hours: Vitals:   11/01/22 0019 11/01/22 0544 11/01/22 0818 11/01/22 1248  BP: (!) 148/71 (!) 149/66 (!) 172/71 139/64  Pulse: 96 86 98 81  Resp: 16 18 17 17   Temp: 99 F (37.2 C) 98.5 F (36.9 C) 98.2 F (36.8 C) 98.5 F (36.9 C)  TempSrc: Oral Oral Oral Oral  SpO2: 99% 99% 100% 96%  Weight:      Height:       Supplemental O2: Room Air SpO2: 96 %   Physical Exam:  Constitutional: Elderly female sitting in bed, in no acute distress Cardiovascular: regular rate and rhythm Pulmonary/Chest: normal work of breathing on room air MSK: Bandaged R AKA without tenderness, edema, or erythema proximally. Left lower extremity with stable 2+ pitting edema. Neurological: alert & oriented x 3 Skin: warm and dry  Filed Weights   10/28/22 1721 10/31/22 0841  Weight: 71.2 kg 72 kg    No intake or output data in the 24 hours ending 11/01/22 1256 Net IO Since Admission: -1,894.6 mL [11/01/22 1256]  Pertinent Labs:    Latest Ref Rng & Units 11/01/2022    3:37 AM 10/31/2022    2:49 AM 10/30/2022    2:41 AM  CBC  WBC 4.0 - 10.5 K/uL 11.7  10.3  9.7   Hemoglobin 12.0 - 15.0 g/dL 8.2  8.7  8.9   Hematocrit 36.0 - 46.0 % 25.8  27.0  29.2   Platelets 150 - 400 K/uL 365  392  377        Latest Ref Rng & Units 11/01/2022    3:37 AM 10/31/2022    2:49 AM 10/30/2022    2:41 AM  CMP  Glucose 70 - 99 mg/dL 110  154  125   BUN 8 - 23 mg/dL 45  47  53   Creatinine 0.44 - 1.00 mg/dL  1.66  1.92  1.94   Sodium 135 - 145 mmol/L 140  139  139   Potassium 3.5 - 5.1 mmol/L 3.9  3.9  4.0   Chloride 98 - 111 mmol/L 116  113  113   CO2 22 - 32 mmol/L 18  17  17    Calcium 8.9 - 10.3 mg/dL 9.5  9.4  9.8     Imaging: No results found.  Assessment/Plan:   Active Problems:   Gangrene of right foot [facility])   Patient Summary: Angela Hays is a 71 y.o. with a pertinent PMH of type 2 diabetes, hypertension, CKD stage IV, HFpEF, CVA with residual left-sided weakness, HLD, depression, and gout who presented to the ED with 4 days of severe right foot pain and is admitted for cellulitis of the right foot.     Nonhealing ulcer of the right foot toe, likely necrotic Cellulitis of the right lower extremity Poor peripheral perfusion likely secondary to PAD and chronic venous insufficiency Doing very well post op. Wound looks good and without significant pain. Blood  cultures negative at 4 days. Will continue to monitor and work towards discharge. Pt is bedbound at baseline, will plan for discharge back to [facility] when ready. - R AKA 11/6, stable - PRN dilaudid and tylenol  - D/c abx 11/8. vancomycin (11/2) and cefepime/metronidazole (11/3)   Prerenal AKI on CKD stage IV  Stable at 1.66 consistent with baseline 2 years ago. - Encourage p.o. intake and we will continue to monitor   Type 2 diabetes mellitus A1c here of 6.1.  Home regimen of Januvia, Lantus, Humalog.  - SSI, plan to start Semglee 10 units at bedtime if sugars not controlled by SSI   HFpEF Hypertension History of CVA with left-sided paresis Stable symptoms and no current signs of exacerbation. We will consider consolidating her antihypertensives after surgery.  - Continue labetalol 100 mg twice daily, amlodipine 10 mg daily, clonidine 0.3 mg twice daily, and hydralazine 25 mg every 6 hours - Resume clopidogrel tomorrow    Depression Continue Zoloft 25 mg daily  Diet: Normal IVF: None VTE: None Code:  Full  Dispo: Anticipated discharge to back to [facility] pending continued good post op recovery.   [facility] Resident PGY-1 Pager: [phone]  Please contact the on call pager after 5 pm and on weekends at [phone].

## 2022-11-01 NOTE — Progress Notes (Addendum)
[facility] [place]  Subjective  - No new complaints pain controlled   Objective (!) 149/66 86 98.5 F (36.9 C) (Oral) 18 99%  Intake/Output Summary (Last 24 hours) at 11/01/2022 0641 Last data filed at 10/31/2022 1143 Gross per 24 hour  Intake 800 ml  Output 200 ml  Net 600 ml    Right AKA clean and dry will maintained Lungs non labored breathing General no acute distress  Assessment/Planning: POD # 1 right AKA for right LE tissue loss without revascularization options  Maintain dressing to protect incision Stable post op disposition  Roxy Horseman 11/01/2022 6:41 AM --  VASCULAR STAFF ADDENDUM: I have independently interviewed and examined the patient. I agree with the above.  Stump soft Dressing off Friday  Cassandria Santee, MD [facility] Phone Number: [phone] 11/01/2022 10:56 AM    Laboratory Lab Results: Recent Labs    10/31/22 0249 11/01/22 0337  WBC 10.3 11.7*  HGB 8.7* 8.2*  HCT 27.0* 25.8*  PLT 392 365   BMET Recent Labs    10/31/22 0249 11/01/22 0337  NA 139 140  K 3.9 3.9  CL 113* 116*  CO2 17* 18*  GLUCOSE 154* 110*  BUN 47* 45*  CREATININE 1.92* 1.66*  CALCIUM 9.4 9.5    COAG Lab Results  Component Value Date   INR 1.1 10/28/2022   INR 1.42 05/06/2016   INR 1.15 01/05/2016   No results found for: "PTT"

## 2022-11-02 DIAGNOSIS — M79671 Pain in right foot: Secondary | ICD-10-CM

## 2022-11-02 LAB — CULTURE, BLOOD (ROUTINE X 2)
Culture: NO GROWTH
Culture: NO GROWTH
Special Requests: ADEQUATE

## 2022-11-02 LAB — CBC
HCT: 25.1 % — ABNORMAL LOW (ref 36.0–46.0)
Hemoglobin: 7.8 g/dL — ABNORMAL LOW (ref 12.0–15.0)
MCH: 30.5 pg (ref 26.0–34.0)
MCHC: 31.1 g/dL (ref 30.0–36.0)
MCV: 98 fL (ref 80.0–100.0)
Platelets: 352 K/uL (ref 150–400)
RBC: 2.56 MIL/uL — ABNORMAL LOW (ref 3.87–5.11)
RDW: 15.7 % — ABNORMAL HIGH (ref 11.5–15.5)
WBC: 10.3 K/uL (ref 4.0–10.5)
nRBC: 0 % (ref 0.0–0.2)

## 2022-11-02 LAB — BASIC METABOLIC PANEL WITH GFR
Anion gap: 9 (ref 5–15)
BUN: 39 mg/dL — ABNORMAL HIGH (ref 8–23)
CO2: 17 mmol/L — ABNORMAL LOW (ref 22–32)
Calcium: 9.5 mg/dL (ref 8.9–10.3)
Chloride: 115 mmol/L — ABNORMAL HIGH (ref 98–111)
Creatinine, Ser: 1.49 mg/dL — ABNORMAL HIGH (ref 0.44–1.00)
GFR, Estimated: 37 mL/min — ABNORMAL LOW (ref >60)
Glucose, Bld: 151 mg/dL — ABNORMAL HIGH (ref 70–99)
Potassium: 3.9 mmol/L (ref 3.5–5.1)
Sodium: 141 mmol/L (ref 135–145)

## 2022-11-02 LAB — GLUCOSE, CAPILLARY
Glucose-Capillary: 147 mg/dL — ABNORMAL HIGH (ref 70–99)
Glucose-Capillary: 225 mg/dL — ABNORMAL HIGH (ref 70–99)
Glucose-Capillary: 144 mg/dL — ABNORMAL HIGH (ref 70–99)
Glucose-Capillary: 162 mg/dL — ABNORMAL HIGH (ref 70–99)

## 2022-11-02 LAB — SURGICAL PATHOLOGY

## 2022-11-02 MED ORDER — INSULIN GLARGINE-YFGN 100 UNIT/ML ~~LOC~~ SOLN
5.0000 [IU] | Freq: Every day | SUBCUTANEOUS | Status: DC
  Administered 2022-11-02 – 2022-11-03 (×2): 5 [IU] via SUBCUTANEOUS
  Filled 2022-11-02 (×4): qty 0.05

## 2022-11-02 MED ORDER — ALTEPLASE 2 MG IJ SOLR
2.0000 mg | Freq: Once | INTRAMUSCULAR | Status: AC
  Administered 2022-11-02: 2 mg
  Filled 2022-11-02: qty 2

## 2022-11-02 NOTE — Progress Notes (Addendum)
[facility] [place]  Subjective  - No new complaints   Objective (!) 144/61 77 98.1 F (36.7 C) 16 100%  Intake/Output Summary (Last 24 hours) at 11/02/2022 0717 Last data filed at 11/02/2022 0600 Gross per 24 hour  Intake 1543.16 ml  Output 900 ml  Net 643.16 ml   Right AKA dressing clean and dry Lungs non labored breathing, no acute distress   Assessment/Planning: POD # 2 right AKA due to  tissue loss without revascularization options   Dressing will be maintained until Friday 11/04/22. Plavix has been restarted Call with questions otherwise we will see her Friday.   Roxy Horseman 11/02/2022 7:17 AM --  VASCULAR STAFF ADDENDUM: I have independently interviewed and examined the patient. I agree with the above.  Resting comfortably. No pain.  Will take down dressing Friday.   Cassandria Santee, MD [facility] Phone Number: [phone] 11/02/2022 3:03 PM    Laboratory Lab Results: Recent Labs    11/01/22 0337 11/02/22 0300  WBC 11.7* 10.3  HGB 8.2* 7.8*  HCT 25.8* 25.1*  PLT 365 352   BMET Recent Labs    11/01/22 0337 11/02/22 0300  NA 140 141  K 3.9 3.9  CL 116* 115*  CO2 18* 17*  GLUCOSE 110* 151*  BUN 45* 39*  CREATININE 1.66* 1.49*  CALCIUM 9.5 9.5    COAG Lab Results  Component Value Date   INR 1.1 10/28/2022   INR 1.42 05/06/2016   INR 1.15 01/05/2016   No results found for: "PTT"

## 2022-11-02 NOTE — Evaluation (Addendum)
Occupational Therapy Evaluation Patient Details Name: Angela Hays MRN: [id number] DOB: Jun 25, 1951 Today's Date: 11/02/2022   History of Present Illness Pt is a 71 y/o F presenting to ED on 11/3 from [facility] with RLE ischemia x2 weeks. S/p R AKA on 11/6. PMH includes CVA L weakness, HTN, DM, heart failure, and hyperkalemia.   Clinical Impression   Pt dependent at baseline for ADLs with the exception of self feeding and is typically lifted via hoyer to w/c from bed at SNF, reports being able to propel w/c with RUE and BLE's at baseline. Pt with L sided deficits from prior CVA, limited use of LUE and keeps in guarded position. Pt currently needing min-total A +2 for ADLs, and total A +2 for bed mobility, pt unable to maintain sitting balance needing total A support to prevent posterior lean. Pt presenting with impairments listed below, will follow acutely. Recommend SNF at d/c.     Recommendations for follow up therapy are one component of a multi-disciplinary discharge planning process, led by the attending physician.  Recommendations may be updated based on patient status, additional functional criteria and insurance authorization.   Follow Up Recommendations  Skilled nursing-short term rehab (<3 hours/day)    Assistance Recommended at Discharge Frequent or constant Supervision/Assistance  Patient can return home with the following Two people to help with walking and/or transfers;Two people to help with bathing/dressing/bathroom;Assistance with feeding;Assistance with cooking/housework;Direct supervision/assist for medications management;Direct supervision/assist for financial management;Assist for transportation;Help with stairs or ramp for entrance    Functional Status Assessment  Patient has had a recent decline in their functional status and demonstrates the ability to make significant improvements in function in a reasonable and predictable amount of time.  Equipment  Recommendations  None recommended by OT (defer)    Recommendations for Other Services PT consult     Precautions / Restrictions Precautions Precautions: Fall Restrictions Weight Bearing Restrictions: Yes RLE Weight Bearing: Non weight bearing      Mobility Bed Mobility Overal bed mobility: Needs Assistance Bed Mobility: Supine to Sit, Sit to Supine     Supine to sit: +2 for physical assistance, Total assist Sit to supine: +2 for physical assistance, Total assist   General bed mobility comments: helicopter method used to get pt to EOB    Transfers                   General transfer comment: deferred      Balance Overall balance assessment: Needs assistance Sitting-balance support: Bilateral upper extremity supported Sitting balance-Leahy Scale: Zero Sitting balance - Comments: posterior lean                                   ADL either performed or assessed with clinical judgement   ADL Overall ADL's : Needs assistance/impaired Eating/Feeding: Set up   Grooming: Minimal assistance   Upper Body Bathing: Moderate assistance   Lower Body Bathing: Total assistance   Upper Body Dressing : Moderate assistance   Lower Body Dressing: Total assistance   Toilet Transfer: Total assistance;+2 for physical assistance   Toileting- Clothing Manipulation and Hygiene: Total assistance       Functional mobility during ADLs: Total assistance;+2 for physical assistance       Vision   Vision Assessment?: No apparent visual deficits     Perception Perception Perception: Not tested   Praxis Praxis Praxis: Not tested  Pertinent Vitals/Pain Pain Assessment Pain Assessment: Faces Pain Score: 6  Faces Pain Scale: Hurts even more Pain Location: back with sitting EOB Pain Descriptors / Indicators: Discomfort Pain Intervention(s): Limited activity within patient's tolerance, Monitored during session, Repositioned     Hand Dominance      Extremity/Trunk Assessment Upper Extremity Assessment Upper Extremity Assessment: Defer to OT evaluation LUE Deficits / Details: ~90* AAROM shoulder flexion,  can bring hand to mouth, increased tone with elbow flex/ext, guards LUE with RUE and L elbow flexed at 90*, can open and close hand, grasp 2+/5 LUE Coordination: decreased fine motor;decreased gross motor   Lower Extremity Assessment Lower Extremity Assessment: RLE deficits/detail;LLE deficits/detail RLE Deficits / Details: New AKA; painful with movement; holding in flexed and abducted position LLE Deficits / Details: Hx of L residual weakness, reports CVA in 2008, Demonstrating 1/5 strength throughout with L knee flexion contracture (~50 degrees)   Cervical / Trunk Assessment Cervical / Trunk Assessment: Kyphotic   Communication Communication Communication: No difficulties   Cognition Arousal/Alertness: Awake/alert Behavior During Therapy: WFL for tasks assessed/performed Overall Cognitive Status: Within Functional Limits for tasks assessed                                       General Comments  VSS on RA    Exercises     Shoulder Instructions      Home Living Family/patient expects to be discharged to:: Skilled nursing facility                                 Additional Comments: Pt from long term care [facility]      Prior Functioning/Environment Prior Level of Function : Needs assist       Physical Assist : Mobility (physical);ADLs (physical)     Mobility Comments: Pt reports hoyer lift to w/c but spent most of her day in w/c.  States she was working with therapy 2 months ago and could propel w/c some with R UE and bil LE. ADLs Comments: Assist for all ADLs        OT Problem List: Decreased strength;Decreased range of motion;Decreased activity tolerance;Impaired balance (sitting and/or standing);Decreased safety awareness;Impaired UE functional use;Decreased  knowledge of precautions;Decreased knowledge of use of DME or AE      OT Treatment/Interventions: Self-care/ADL training;Therapeutic activities;Visual/perceptual remediation/compensation;Balance training;Patient/family education;DME and/or AE instruction;Energy conservation;Therapeutic exercise;Neuromuscular education;Splinting    OT Goals(Current goals can be found in the care plan section) Acute Rehab OT Goals Patient Stated Goal: to eat OT Goal Formulation: With patient Time For Goal Achievement: 11/16/22 Potential to Achieve Goals: Fair  OT Frequency: Min 2X/week    Co-evaluation PT/OT/SLP Co-Evaluation/Treatment: Yes Reason for Co-Treatment: For patient/therapist safety PT goals addressed during session: Mobility/safety with mobility;Balance OT goals addressed during session: ADL's and self-care;Strengthening/ROM      AM-PAC OT "6 Clicks" Daily Activity     Outcome Measure Help from another person eating meals?: A Little Help from another person taking care of personal grooming?: A Little Help from another person toileting, which includes using toliet, bedpan, or urinal?: Total Help from another person bathing (including washing, rinsing, drying)?: Total Help from another person to put on and taking off regular upper body clothing?: A Lot Help from another person to put on and taking off regular lower body clothing?: Total 6 Click Score:  11   End of Session Nurse Communication: Mobility status  Activity Tolerance: Patient tolerated treatment well Patient left: in bed;with call bell/phone within reach;with bed alarm set  OT Visit Diagnosis: Unsteadiness on feet (R26.81);Other abnormalities of gait and mobility (R26.89);Muscle weakness (generalized) (M62.81)                Time: 1031-5945 OT Time Calculation (min): 19 min Charges:  OT General Charges $OT Visit: 1 Visit OT Evaluation $OT Eval Moderate Complexity: [address], OTD, OTR/L Acute Rehab [phone]  [place] 11/02/2022, 4:24 PM

## 2022-11-02 NOTE — Progress Notes (Signed)
HD#5 Subjective:   Summary: Angela Hays is a 71 year old female with a past medical history of type 2 diabetes, hypertension, CKD stage IV, HFpEF, CVA with residual left-sided weakness, HLD, depression, and gout who presented to the ED with 4 days of severe right foot pain and is admitted for cellulitis of the right foot with necrosis 2/2 to PAD.     Overnight Events: None   Pt is doing very well this morning and denies any new or worsening pain. No complaints or concerns today.  Objective:  Vital signs in last 24 hours: Vitals:   11/01/22 1609 11/01/22 2056 11/02/22 0553 11/02/22 0729  BP: (!) 135/56 (!) 141/56 (!) 144/61 (!) 150/66  Pulse: 83 81 77 79  Resp: 17  16 16   Temp: 98.3 F (36.8 C) 98.2 F (36.8 C) 98.1 F (36.7 C) 98.3 F (36.8 C)  TempSrc: Oral Oral  Oral  SpO2: 100% 100% 100% 100%  Weight:      Height:       Supplemental O2: Room Air SpO2: 100 %   Physical Exam:  Constitutional: Elderly female sitting in bed, in no acute distress Cardiovascular: regular rate and rhythm Pulmonary/Chest: normal work of breathing on room air MSK: Bandaged R AKA without tenderness, edema, or erythema proximally. Left lower extremity with stable 2+ pitting edema. Neurological: alert & oriented x 3 Skin: warm and dry  Filed Weights   10/28/22 1721 10/31/22 0841  Weight: 71.2 kg 72 kg     Intake/Output Summary (Last 24 hours) at 11/02/2022 0753 Last data filed at 11/02/2022 0600 Gross per 24 hour  Intake 1543.16 ml  Output 900 ml  Net 643.16 ml   Net IO Since Admission: -744.37 mL [11/02/22 0753]  Pertinent Labs:    Latest Ref Rng & Units 11/02/2022    3:00 AM 11/01/2022    3:37 AM 10/31/2022    2:49 AM  CBC  WBC 4.0 - 10.5 K/uL 10.3  11.7  10.3   Hemoglobin 12.0 - 15.0 g/dL 7.8  8.2  8.7   Hematocrit 36.0 - 46.0 % 25.1  25.8  27.0   Platelets 150 - 400 K/uL 352  365  392        Latest Ref Rng & Units 11/02/2022    3:00 AM 11/01/2022    3:37 AM 10/31/2022     2:49 AM  CMP  Glucose 70 - 99 mg/dL 151  110  154   BUN 8 - 23 mg/dL 39  45  47   Creatinine 0.44 - 1.00 mg/dL 1.49  1.66  1.92   Sodium 135 - 145 mmol/L 141  140  139   Potassium 3.5 - 5.1 mmol/L 3.9  3.9  3.9   Chloride 98 - 111 mmol/L 115  116  113   CO2 22 - 32 mmol/L 17  18  17    Calcium 8.9 - 10.3 mg/dL 9.5  9.5  9.4     Imaging: No results found.  Assessment/Plan:   Active Problems:   Gangrene of right foot (HCC)   S/P AKA (above knee amputation) unilateral, right (HCC)   Type 2 diabetes mellitus with diabetic foot infection ([place])   Patient Summary: Angela Hays is a 71 y.o. with a pertinent PMH of type 2 diabetes, hypertension, CKD stage IV, HFpEF, CVA with residual left-sided weakness, HLD, depression, and gout who presented to the ED with 4 days of severe right foot pain and is admitted for cellulitis of the  right foot.     Nonhealing ulcer of the right foot toe, likely necrotic Cellulitis of the right lower extremity Poor peripheral perfusion likely secondary to PAD and chronic venous insufficiency Doing very well post op. Wound looks good and without significant pain. Blood cultures negative at 5 days.  Vascular surgery following and will recheck on 11/10.  Pt is bedbound at baseline, will plan for discharge back to [facility] when ready. - R AKA 11/6, stable - PRN dilaudid and tylenol, has not needed any - D/c abx today, 11/8. vancomycin (11/2) and cefepime/metronidazole (11/3)   Prerenal AKI on CKD stage IV  Stable at 1.49 consistent with baseline 2 years ago. - Encourage p.o. intake and we will continue to monitor   Type 2 diabetes mellitus A1c here of 6.1.  Home regimen of Januvia, Lantus, Humalog.  - SSI, start Semglee 5 units at bedtime   HFpEF Hypertension History of CVA with left-sided paresis Stable. - Continue labetalol 100 mg twice daily, amlodipine 10 mg daily, clonidine 0.3 mg twice daily, and hydralazine 25 mg every 6 hours - Continue  clopidogrel    Depression Continue Zoloft 25 mg daily   Diet: Normal IVF: None VTE: None Code: Full   Dispo: Anticipated discharge to back to [facility] pending continued good post op recovery, hopefully by the beginning of next week.   [facility] Resident PGY-1 Pager: [phone]  Please contact the on call pager after 5 pm and on weekends at [phone].

## 2022-11-02 NOTE — Social Work (Signed)
Pt is from [facility] long term and will return when medically stable. CSW will continue to follow for discharge planning.

## 2022-11-02 NOTE — Care Management Important Message (Signed)
Important Message  Patient Details  Name: Angela Hays MRN: [id number] Date of Birth: 07/02/1951   Medicare Important Message Given:  Yes     Hannah Beat 11/02/2022, 1:33 PM

## 2022-11-02 NOTE — Evaluation (Signed)
Physical Therapy Evaluation Patient Details Name: Angela Hays MRN: [id number] DOB: Jul 06, 1951 Today's Date: 11/02/2022  History of Present Illness  Pt is a 71 y/o F presenting to ED on 11/3 from [facility] with RLE ischemia x2 weeks. S/p R AKA on 11/6. PMH includes CVA L weakness, HTN, DM, heart failure, and hyperkalemia.  Clinical Impression  Pt admitted with above diagnosis. At baseline, pt from long term care and was hoyer to w/c but reports she could propel w/c using her R UE and bil LE.  Pt now with R AKA and he L side has residual weakness and contractures from CVA in 2008.  Pt required max x 2 to transition to EOB and unable to balance with posterior lean c/o back pain.  Pt with limited rehab potential, but hopeful to at least be able to hoyer to w/c and balance while sitting in w/c.  Could potentially propel w/c some with assist using R UE and L LE as able but this will be limited. Pt currently with functional limitations due to the deficits listed below (see PT Problem List). Pt will benefit from skilled PT to increase their independence and safety with mobility to allow discharge to the venue listed below.          Recommendations for follow up therapy are one component of a multi-disciplinary discharge planning process, led by the attending physician.  Recommendations may be updated based on patient status, additional functional criteria and insurance authorization.  Follow Up Recommendations Skilled nursing-short term rehab (<3 hours/day) (with transition back to LTC) Can patient physically be transported by private vehicle: No    Assistance Recommended at Discharge Frequent or constant Supervision/Assistance  Patient can return home with the following  Two people to help with walking and/or transfers;Two people to help with bathing/dressing/bathroom    Equipment Recommendations None recommended by PT  Recommendations for Other Services       Functional Status  Assessment Patient has had a recent decline in their functional status and demonstrates the ability to make significant improvements in function in a reasonable and predictable amount of time.     Precautions / Restrictions Precautions Precautions: Fall Restrictions Weight Bearing Restrictions: Yes RLE Weight Bearing: Non weight bearing      Mobility  Bed Mobility Overal bed mobility: Needs Assistance Bed Mobility: Supine to Sit, Sit to Supine     Supine to sit: +2 for physical assistance, Max assist Sit to supine: +2 for physical assistance, Max assist   General bed mobility comments: helicopter method used to get pt to EOB - pt pulling up some with R UE    Transfers                   General transfer comment: hoyer at baseline    Ambulation/Gait                  Stairs            Wheelchair Mobility    Modified Rankin (Stroke Patients Only)       Balance Overall balance assessment: Needs assistance Sitting-balance support: Bilateral upper extremity supported Sitting balance-Leahy Scale: Zero Sitting balance - Comments: Posterior lean and c/o back pain when attempting to sit straight; requiring mod-max A but limited by back pain  Pertinent Vitals/Pain Pain Assessment Pain Assessment: Faces Faces Pain Scale: Hurts even more Pain Location: back with sitting EOB Pain Descriptors / Indicators: Discomfort, Crying Pain Intervention(s): Monitored during session, Limited activity within patient's tolerance, Repositioned    Home Living Family/patient expects to be discharged to:: Skilled nursing facility                   Additional Comments: Pt from long term care [facility]    Prior Function Prior Level of Function : Needs assist       Physical Assist : Mobility (physical);ADLs (physical)     Mobility Comments: Pt reports hoyer lift to w/c but spent most of her day in  w/c.  States she was working with therapy 2 months ago and could propel w/c some with R UE and bil LE. ADLs Comments: Assist for all ADLs     Hand Dominance        Extremity/Trunk Assessment   Upper Extremity Assessment Upper Extremity Assessment: Defer to OT evaluation LUE Deficits / Details: ~90* AAROM shoulder flexion,  can bring hand to mouth, increased tone with elbow flex/ext, guards LUE with RUE and L elbow flexed at 90*, can open and close hand, grasp 2+/5 LUE Coordination: decreased fine motor;decreased gross motor    Lower Extremity Assessment Lower Extremity Assessment: RLE deficits/detail;LLE deficits/detail RLE Deficits / Details: New AKA; painful with movement; holding in flexed and abducted position LLE Deficits / Details: Hx of L residual weakness, reports CVA in 2008, Demonstrating 1/5 strength throughout with L knee flexion contracture (~50 degrees)    Cervical / Trunk Assessment Cervical / Trunk Assessment: Kyphotic  Communication   Communication: No difficulties  Cognition Arousal/Alertness: Awake/alert Behavior During Therapy: WFL for tasks assessed/performed Overall Cognitive Status: Within Functional Limits for tasks assessed                                          General Comments General comments (skin integrity, edema, etc.): VSS    Exercises     Assessment/Plan    PT Assessment Patient needs continued PT services  PT Problem List Decreased strength;Decreased mobility;Decreased range of motion;Decreased coordination;Decreased activity tolerance;Pain;Decreased balance;Decreased knowledge of use of DME       PT Treatment Interventions DME instruction;Therapeutic activities;Therapeutic exercise;Patient/family education;Balance training;Wheelchair mobility training;Functional mobility training;Neuromuscular re-education;Manual techniques    PT Goals (Current goals can be found in the Care Plan section)  Acute Rehab PT  Goals Patient Stated Goal: return to [facility] PT Goal Formulation: With patient Time For Goal Achievement: 11/16/22 Potential to Achieve Goals: Poor Additional Goals Additional Goal #1: Pt will be able to sit and balance in w/c Additional Goal #2: Pt will propel w/c with R UE, L LE (minimal use L LE) 100' with min A    Frequency Min 2X/week     Co-evaluation PT/OT/SLP Co-Evaluation/Treatment: Yes Reason for Co-Treatment: For patient/therapist safety PT goals addressed during session: Mobility/safety with mobility;Balance OT goals addressed during session: ADL's and self-care;Strengthening/ROM       AM-PAC PT "6 Clicks" Mobility  Outcome Measure Help needed turning from your back to your side while in a flat bed without using bedrails?: Total Help needed moving from lying on your back to sitting on the side of a flat bed without using bedrails?: Total Help needed moving to and from a bed to a chair (including a wheelchair)?: Total  Help needed standing up from a chair using your arms (e.g., wheelchair or bedside chair)?: Total Help needed to walk in hospital room?: Total Help needed climbing 3-5 steps with a railing? : Total 6 Click Score: 6    End of Session   Activity Tolerance: Patient limited by pain Patient left: in bed;with bed alarm set;with call bell/phone within reach Nurse Communication: Mobility status PT Visit Diagnosis: Muscle weakness (generalized) (M62.81)    Time: 2244-9753 PT Time Calculation (min) (ACUTE ONLY): 19 min   Charges:   PT Evaluation $PT Eval Low Complexity: 1 Low          Kayl Stogdill, PT Acute Rehab [facility] [phone]   Karlton Lemon 11/02/2022, 4:26 PM

## 2022-11-03 ENCOUNTER — Encounter: Payer: Medicare Other | Admitting: Vascular Surgery

## 2022-11-03 ENCOUNTER — Ambulatory Visit (HOSPITAL_COMMUNITY)
Admission: RE | Admit: 2022-11-03 | Discharge: 2022-11-03 | Disposition: A | Discharge status: Home / Self Care | Payer: Medicare Other | Source: Ambulatory Visit | Attending: Vascular Surgery | Admitting: Vascular Surgery

## 2022-11-03 DIAGNOSIS — Z87891 Personal history of nicotine dependence: Secondary | ICD-10-CM

## 2022-11-03 DIAGNOSIS — I159 Secondary hypertension, unspecified: Secondary | ICD-10-CM

## 2022-11-03 DIAGNOSIS — M79671 Pain in right foot: Secondary | ICD-10-CM | POA: Diagnosis not present

## 2022-11-03 DIAGNOSIS — E1152 Type 2 diabetes mellitus with diabetic peripheral angiopathy with gangrene: Secondary | ICD-10-CM | POA: Diagnosis not present

## 2022-11-03 DIAGNOSIS — Z794 Long term (current) use of insulin: Secondary | ICD-10-CM | POA: Diagnosis not present

## 2022-11-03 LAB — BASIC METABOLIC PANEL
Anion gap: 6 (ref 5–15)
BUN: 38 mg/dL — ABNORMAL HIGH (ref 8–23)
CO2: 19 mmol/L — ABNORMAL LOW (ref 22–32)
Calcium: 9.6 mg/dL (ref 8.9–10.3)
Chloride: 115 mmol/L — ABNORMAL HIGH (ref 98–111)
Creatinine, Ser: 1.64 mg/dL — ABNORMAL HIGH (ref 0.44–1.00)
GFR, Estimated: 33 mL/min — ABNORMAL LOW (ref >60)
Glucose, Bld: 135 mg/dL — ABNORMAL HIGH (ref 70–99)
Potassium: 3.9 mmol/L (ref 3.5–5.1)
Sodium: 140 mmol/L (ref 135–145)

## 2022-11-03 LAB — GLUCOSE, CAPILLARY
Glucose-Capillary: 155 mg/dL — ABNORMAL HIGH (ref 70–99)
Glucose-Capillary: 215 mg/dL — ABNORMAL HIGH (ref 70–99)
Glucose-Capillary: 210 mg/dL — ABNORMAL HIGH (ref 70–99)
Glucose-Capillary: 135 mg/dL — ABNORMAL HIGH (ref 70–99)

## 2022-11-03 LAB — CBC
HCT: 25.2 % — ABNORMAL LOW (ref 36.0–46.0)
Hemoglobin: 8 g/dL — ABNORMAL LOW (ref 12.0–15.0)
MCH: 30.9 pg (ref 26.0–34.0)
MCHC: 31.7 g/dL (ref 30.0–36.0)
MCV: 97.3 fL (ref 80.0–100.0)
Platelets: 401 10*3/uL — ABNORMAL HIGH (ref 150–400)
RBC: 2.59 MIL/uL — ABNORMAL LOW (ref 3.87–5.11)
RDW: 15.6 % — ABNORMAL HIGH (ref 11.5–15.5)
WBC: 10.4 10*3/uL (ref 4.0–10.5)
nRBC: 0 % (ref 0.0–0.2)

## 2022-11-03 NOTE — Progress Notes (Addendum)
HD#6 Subjective:   Summary: Angela Hays is a 71 year old female with a past medical history of type 2 diabetes, hypertension, CKD stage IV, HFpEF, CVA with residual left-sided weakness, HLD, depression, and gout who presented to the ED with 4 days of severe right foot pain and is admitted for cellulitis of the right foot with necrosis 2/2 to PAD.      Overnight Events: None   Pt continues to do very well and has no new or worsening complaints.   Objective:  Vital signs in last 24 hours: Vitals:   11/02/22 1621 11/02/22 1948 11/02/22 2100 11/03/22 0403  BP: (!) 104/49 (!) 136/56 (!) 158/66 (!) 145/66  Pulse: 74 87 84 79  Resp: 16 20  18   Temp: 98.5 F (36.9 C) 98.1 F (36.7 C)  98.8 F (37.1 C)  TempSrc: Oral Oral  Oral  SpO2: 98% 100%  98%  Weight:      Height:       Supplemental O2: Room Air SpO2: 98 %   Physical Exam:  Constitutional: Elderly female sitting in bed, in no acute distress Cardiovascular: regular rate and rhythm Pulmonary/Chest: normal work of breathing on room air MSK: Bandaged R AKA without tenderness, edema, or erythema proximally. Left lower extremity with stable 2+ pitting edema. Neurological: alert & oriented x 3 Skin: warm and dry  Filed Weights   10/28/22 1721 10/31/22 0841  Weight: 71.2 kg 72 kg    No intake or output data in the 24 hours ending 11/03/22 0653 Net IO Since Admission: -744.37 mL [11/03/22 0653]  Pertinent Labs:    Latest Ref Rng & Units 11/03/2022    4:18 AM 11/02/2022    3:00 AM 11/01/2022    3:37 AM  CBC  WBC 4.0 - 10.5 K/uL 10.4  10.3  11.7   Hemoglobin 12.0 - 15.0 g/dL 8.0  7.8  8.2   Hematocrit 36.0 - 46.0 % 25.2  25.1  25.8   Platelets 150 - 400 K/uL 401  352  365        Latest Ref Rng & Units 11/03/2022    4:18 AM 11/02/2022    3:00 AM 11/01/2022    3:37 AM  CMP  Glucose 70 - 99 mg/dL 135  151  110   BUN 8 - 23 mg/dL 38  39  45   Creatinine 0.44 - 1.00 mg/dL 1.64  1.49  1.66   Sodium 135 - 145 mmol/L 140   141  140   Potassium 3.5 - 5.1 mmol/L 3.9  3.9  3.9   Chloride 98 - 111 mmol/L 115  115  116   CO2 22 - 32 mmol/L 19  17  18    Calcium 8.9 - 10.3 mg/dL 9.6  9.5  9.5     Imaging: No results found.  Assessment/Plan:   Active Problems:   Gangrene of right foot (HCC)   S/P AKA (above knee amputation) unilateral, right (HCC)   Type 2 diabetes mellitus with diabetic foot infection ([place])   Patient Summary: Angela Hays is a 71 y.o. with a pertinent PMH of type 2 diabetes, hypertension, CKD stage IV, HFpEF, CVA with residual left-sided weakness, HLD, depression, and gout who presented to the ED with 4 days of severe right foot pain and is admitted for cellulitis of the right foot.     Nonhealing ulcer of the right foot toe, likely necrotic Cellulitis of the right lower extremity Poor peripheral perfusion likely secondary to PAD  and chronic venous insufficiency Doing very well post op. Wound looks good and without significant pain. Blood cultures negative at 5 days.  Vascular surgery following and will recheck on 11/10. Abx 11/2-11/8 (vanc/metro/cefepime). Pt is bedbound at baseline, will plan for discharge back to [facility] when ready. - R AKA 11/6, stable - PRN dilaudid and tylenol, has not needed any   Prerenal AKI on CKD stage IV  Stable at 1.5-1.6 consistent with baseline 2 years ago. - Encourage p.o. intake and we will continue to monitor   Type 2 diabetes mellitus A1c here of 6.1. Home regimen of Januvia, Lantus, Humalog.  - SSI, Semglee 5 units at bedtime   HFpEF Hypertension History of CVA with left-sided paresis Stable. - Continue labetalol 100 mg twice daily, amlodipine 10 mg daily, clonidine 0.3 mg twice daily, and hydralazine 25 mg every 6 hours - Continue clopidogrel    Depression Continue Zoloft 25 mg daily   Diet: Normal IVF: None VTE: None Code: Full   Dispo: Anticipated discharge to back to [facility] pending continued stability and signoff  from vascular surgery after changing the dressing of her AKA tomorrow.   [facility] Resident PGY-1 Pager: [phone]  Please contact the on call pager after 5 pm and on weekends at [phone].

## 2022-11-03 NOTE — Progress Notes (Signed)
This patient's plan of care was discussed with the house staff. Please see their note for complete details. I concur with their findings.  Angela Hays continues to feel "wonderful" and is enthusiastic about going home. Her wound is well healing. D/c planning and f/u for procedure on her other leg.

## 2022-11-04 DIAGNOSIS — M79671 Pain in right foot: Secondary | ICD-10-CM | POA: Diagnosis not present

## 2022-11-04 LAB — BASIC METABOLIC PANEL
Anion gap: 6 (ref 5–15)
BUN: 41 mg/dL — ABNORMAL HIGH (ref 8–23)
CO2: 19 mmol/L — ABNORMAL LOW (ref 22–32)
Calcium: 9.7 mg/dL (ref 8.9–10.3)
Chloride: 113 mmol/L — ABNORMAL HIGH (ref 98–111)
Creatinine, Ser: 1.59 mg/dL — ABNORMAL HIGH (ref 0.44–1.00)
GFR, Estimated: 34 mL/min — ABNORMAL LOW (ref >60)
Glucose, Bld: 171 mg/dL — ABNORMAL HIGH (ref 70–99)
Potassium: 3.9 mmol/L (ref 3.5–5.1)
Sodium: 138 mmol/L (ref 135–145)

## 2022-11-04 LAB — CBC
HCT: 24.9 % — ABNORMAL LOW (ref 36.0–46.0)
Hemoglobin: 7.8 g/dL — ABNORMAL LOW (ref 12.0–15.0)
MCH: 31 pg (ref 26.0–34.0)
MCHC: 31.3 g/dL (ref 30.0–36.0)
MCV: 98.8 fL (ref 80.0–100.0)
Platelets: 411 10*3/uL — ABNORMAL HIGH (ref 150–400)
RBC: 2.52 MIL/uL — ABNORMAL LOW (ref 3.87–5.11)
RDW: 15.9 % — ABNORMAL HIGH (ref 11.5–15.5)
WBC: 10.2 10*3/uL (ref 4.0–10.5)
nRBC: 0 % (ref 0.0–0.2)

## 2022-11-04 LAB — GLUCOSE, CAPILLARY
Glucose-Capillary: 182 mg/dL — ABNORMAL HIGH (ref 70–99)
Glucose-Capillary: 154 mg/dL — ABNORMAL HIGH (ref 70–99)

## 2022-11-04 MED ORDER — HYDRALAZINE HCL 25 MG PO TABS: 25.0000 mg | ORAL_TABLET | Freq: Three times a day (TID) | ORAL | 0 refills | Status: AC

## 2022-11-04 NOTE — TOC Transition Note (Addendum)
Transition of Care [facility]) - CM/SW Discharge Note   Patient Details  Name: Angela Hays MRN: [id number] Date of Birth: [birth year]  [facility] Care [facility] CM/SW Contact:  Tresa Endo Phone Number: 11/04/2022, 2:15 PM   Clinical Narrative:    Patient will DC to: [facility] Anticipated DC date: 11/04/2022 Family notified:`Attempted Pt Daughter and son Transport by: Corey Harold   Per MD patient ready for DC to [facility] room 220a. RN to call report prior to discharge [phone]). RN, patient, patient's family, and facility notified of DC. Discharge Summary and FL2 sent to facility. DC packet on chart. Ambulance transport requested for patient.   CSW will sign off for now as social work intervention is no longer needed. Please consult [place] again if new needs arise.           Patient Goals and CMS Choice        Discharge Placement                       Discharge Plan and Services                                     Social Determinants of Health (SDOH) Interventions     Readmission Risk Interventions     No data to display

## 2022-11-04 NOTE — Discharge Instructions (Signed)
You were hospitalized for nonhealing ulcers on your right leg that required an amputation. Thank you for allowing [place] to be part of your care.   Please make sure to watch for sores on your left leg and get them checked out before they get as painful as your right leg did.   Please call our clinic if you have any questions or concerns, we may be able to help and keep you from a long and expensive emergency room wait. Our clinic and after hours phone number is [phone], the best time to call is Monday through Friday 9 am to 4 pm but there is always someone available 24/7 if you have an emergency. If you need medication refills please notify your pharmacy one week in advance and they will send [place] a request.    Johny Blamer, [place]

## 2022-11-04 NOTE — Progress Notes (Signed)
Pt discharged to [facility] health this pm. Knee shrinkers provided before pick up by [facility] at 1545hrs

## 2022-11-04 NOTE — Progress Notes (Signed)
Orthopedic Tech Progress Note Patient Details:  Angela Hays 17-Dec-1951 [id number] Called in order to [facility] for retention sock Patient ID: Angela Hays, female   DOB: Jan 12, 1951, 71 y.o.   MRN: [id number]  Chip Boer 11/04/2022, 10:09 AM

## 2022-11-04 NOTE — Progress Notes (Signed)
[facility] [place]  Subjective  - No new complaints, smiling. 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 Wound site soft, healing appropriately.  Pt incontinent at baseline. Needs occlusive dressing for the next week changed every other day.  Needs stump sock please.  Okay for d/c for vascular surgery perspective.    Broadus John 11/04/2022 8:32 AM --     Laboratory Lab Results: Recent Labs    11/03/22 0418 11/04/22 0310  WBC 10.4 10.2  HGB 8.0* 7.8*  HCT 25.2* 24.9*  PLT 401* 411*    BMET Recent Labs    11/03/22 0418 11/04/22 0310  NA 140 138  K 3.9 3.9  CL 115* 113*  CO2 19* 19*  GLUCOSE 135* 171*  BUN 38* 41*  CREATININE 1.64* 1.59*  CALCIUM 9.6 9.7     COAG Lab Results  Component Value Date   INR 1.1 10/28/2022   INR 1.42 05/06/2016   INR 1.15 01/05/2016   No results found for: "PTT"

## 2022-11-04 NOTE — Discharge Summary (Signed)
Name: Angela Hays MRN: [id number] DOB: Jul 01, 1951 71 y.o. PCP: Pcp, No  Date of Admission: 10/28/2022  5:07 PM Date of Discharge: 11/04/2022 Attending Physician: Dr. Dareen Piano  Discharge Diagnosis: Critical limb ischemia with nonhealing ulcers on the right foot with necrosis Cellulitis of the right lower extremity Peripheral artery disease Chronic venous insufficiency AKI on CKD stage IV Type 2 diabetes melitis HFpEF Hypertension History of CVA with left-sided paresis Depression    Discharge Medications: Allergies as of 11/04/2022   No Known Allergies      Medication List     STOP taking these medications    cefTRIAXone  IVPB Commonly known as: ROCEPHIN   doxycycline 100 MG capsule Commonly known as: MONODOX   ertapenem 1 g injection Commonly known as: INVANZ   sulfamethoxazole-trimethoprim 800-160 MG tablet Commonly known as: BACTRIM DS   vancomycin 500 MG injection Commonly known as: VANCOCIN       TAKE these medications    acetaminophen 325 MG tablet Commonly known as: TYLENOL Take 2 tablets (650 mg total) by mouth every 6 (six) hours as needed for mild pain (or Fever >/= 101).   amLODipine 10 MG tablet Commonly known as: NORVASC Take 1 tablet (10 mg total) by mouth every morning.   cloNIDine 0.1 MG tablet Commonly known as: CATAPRES Take 1 tablet (0.1 mg total) by mouth 2 (two) times daily. What changed: how much to take   clopidogrel 75 MG tablet Commonly known as: PLAVIX Take 75 mg by mouth daily.   furosemide 40 MG tablet Commonly known as: LASIX Take 1 tablet (40 mg total) by mouth daily.   gabapentin 100 MG capsule Commonly known as: NEURONTIN Take 100 mg by mouth 3 (three) times daily.   HumaLOG KwikPen 100 UNIT/ML KwikPen Generic drug: insulin lispro Inject into the skin See admin instructions. CBG 70 - 120: 0 units CBG 121 - 150: 3 units CBG 151 - 200: 4 units CBG 201 - 250: 6 units CBG 251 - 300: 8 units CBG 301 -  350: 10 units CBG 351 - 400: 12 units CBG 401 - 999: 14 units Call MD for BS>400, Subcutaneously before meals and at bedtime   hydrALAZINE 25 MG tablet Commonly known as: APRESOLINE Take by mouth.   insulin glargine 100 UNIT/ML injection Commonly known as: LANTUS Inject 0.15 mLs (15 Units total) into the skin 2 (two) times daily. What changed:  how much to take when to take this   ipratropium-albuterol 0.5-2.5 (3) MG/3ML Soln Commonly known as: DUONEB Take by nebulization.   labetalol 100 MG tablet Commonly known as: NORMODYNE Take 100 mg by mouth 2 (two) times daily.   lidocaine 1 % (with preservative) injection Commonly known as: XYLOCAINE SMARTSIG:Rectally   multivitamin with minerals Tabs tablet Take 1 tablet by mouth daily.   polyethylene glycol 17 g packet Commonly known as: MIRALAX / GLYCOLAX Take 17 g by mouth daily.   potassium chloride 10 MEQ CR capsule Commonly known as: MICRO-K Take 20 mEq by mouth daily.   saccharomyces boulardii 250 MG capsule Commonly known as: FLORASTOR Take 250 mg by mouth 2 (two) times daily. Probiotic capsule   senna 8.6 MG Tabs tablet Commonly known as: SENOKOT Take 2 tablets by mouth in the morning and at bedtime.   sertraline 25 MG tablet Commonly known as: ZOLOFT Take 25 mg by mouth daily.   simvastatin 20 MG tablet Commonly known as: ZOCOR Take 20 mg by mouth at bedtime.   sitaGLIPtin 50  MG tablet Commonly known as: Januvia Take 1 tablet (50 mg total) by mouth daily.   sodium chloride 0.9 % infusion Inject 60 mLs into the vein See admin instructions. Use 60 mL/hr intravenous every day and night shift for hydration with IV antibiotics for 30 days 60 mL/hr continuous x 30 days with IV therapy   traMADol 50 MG tablet Commonly known as: ULTRAM Take 1 tablet (50 mg total) by mouth every 12 (twelve) hours as needed for moderate pain.               Discharge Care Instructions  (From admission, onward)            Start     Ordered   11/04/22 0000  Discharge wound care:       Comments: Please change the occlusive dressing on right AKA every other day.   11/04/22 1248            Disposition and follow-up:   [name] was discharged from [facility] in Good condition.  At the hospital follow up visit please address:  1.  Follow-up:  a.  Ensure that she has occlusive dressing changes every other day for her right AKA.  Also ensure that she has her stump sock.    b.  Monitor her blood sugars as she required only 5 units of long-acting insulin at night and minimal amounts of short acting during the day.  It appears that she was on 20 units of long-acting nightly prior to admission.  2.  Labs / imaging needed at time of follow-up: None  3.  Pending labs/ test needing follow-up: None  Follow-up Appointments:  Follow-up Information     [facility] Follow up in 2 week(s).   Specialty: Vascular Surgery Why: sent Contact information: [place] Hospital Course by problem list: Angela Hays is a 71 y.o. with a pertinent PMH of type 2 diabetes, hypertension, CKD stage IV, HFpEF, CVA with residual left-sided weakness, HLD, depression, and gout who presented to the ED with 4 days of severe right foot pain and is admitted for cellulitis of the right foot.     Nonhealing ulcer of the right foot toe, likely necrotic Cellulitis of the right lower extremity Poor peripheral perfusion secondary to PAD and chronic venous insufficiency Patient presented with progressively worsening critical limb ischemia on the right with nonhealing ulcers in both weightbearing and nonweightbearing areas with evidence of necrosis.  Vascular surgery was consulted for further evaluation and recommended above-the-knee amputation of the right.  Also discussed progression of disease in  the left, fortunately there are no ulcers on the left yet.  She will likely need an amputation on the left in the future but this was deferred during this admission.  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  She had a very large decrease in the amount of pain she was experiencing and did not require any postop pain medication.  She received antibiotics which included vancomycin, metronidazole, cefepime from 11/2 - 11/8.  She was discharged in good condition with plans to change the dressings on her right AKA every other day.  She will follow-up with vascular surgery outpatient.   Prerenal AKI on CKD stage IV  Creatinine on admission of 2.4 which quickly improved and was stable at 1.5-1.6, consistent with  baseline 2 years ago.   Type 2 diabetes mellitus A1c here of 6.1. Home regimen of Januvia, Lantus, Humalog.  Blood sugars are well controlled with sliding scale insulin and the eventual addition of insulin glargine 5 units at bedtime.  We recommend that her insulin be titrated as it appears she was on 20 units nightly of glargine before coming to the hospital.   HFpEF Hypertension History of CVA with left-sided paresis Chronic conditions above are stable during admission without evidence of heart failure exacerbation, uncontrolled hypertension, or progression of her deficits or any new deficits.  She was continued on home medications during her admission.   Depression She was continued on Zoloft 25 mg daily during her admission.    Discharge Subjective: Patient reports feeling wonderful this morning, denies any pain whatsoever. Discussed plan to discharge later today and availability of pain medications should she need them. Patient very excited about returning home today and expressed gratitude with her care team.  Discharge Exam:   BP (!) 130/57 (BP Location: Left Arm)   Pulse 76   Temp 98.7 F (37.1 C) (Oral)   Resp 18   Ht 5\' 3"  (1.6 m)   Wt 72 kg    SpO2 98%   BMI 28.12 kg/m  Constitutional: Elderly female sitting in bed, in no acute distress Cardiovascular: regular rate and rhythm Pulmonary/Chest: normal work of breathing on room air MSK: Bandaged R AKA without tenderness, edema, or erythema proximally. Left lower extremity with stable 2+ pitting edema. Neurological: alert & oriented x 3 Skin: warm and dry  Pertinent Labs, Studies, and Procedures:     Latest Ref Rng & Units 11/04/2022    3:10 AM 11/03/2022    4:18 AM 11/02/2022    3:00 AM  CBC  WBC 4.0 - 10.5 K/uL 10.2  10.4  10.3   Hemoglobin 12.0 - 15.0 g/dL 7.8  8.0  7.8   Hematocrit 36.0 - 46.0 % 24.9  25.2  25.1   Platelets 150 - 400 K/uL 411  401  352        Latest Ref Rng & Units 11/04/2022    3:10 AM 11/03/2022    4:18 AM 11/02/2022    3:00 AM  CMP  Glucose 70 - 99 mg/dL 171  135  151   BUN 8 - 23 mg/dL 41  38  39   Creatinine 0.44 - 1.00 mg/dL 1.59  1.64  1.49   Sodium 135 - 145 mmol/L 138  140  141   Potassium 3.5 - 5.1 mmol/L 3.9  3.9  3.9   Chloride 98 - 111 mmol/L 113  115  115   CO2 22 - 32 mmol/L 19  19  17    Calcium 8.9 - 10.3 mg/dL 9.7  9.6  9.5     DG Chest 1 View  Result Date: 10/29/2022 CLINICAL DATA:  Placement of right PICC line EXAM: CHEST  1 VIEW COMPARISON:  Previous studies including the examination of 06/21/2019 FINDINGS: Transverse diameter of heart is increased. Central pulmonary vessels are prominent. Increased interstitial markings are seen in right lung. There is interval placement of PICC line through the right upper extremity with its tip in the right atrium. Left hemidiaphragm is elevated. IMPRESSION: Tip of PICC line is seen in the right atrium. Central pulmonary vessels are prominent suggesting CHF. Increased markings are seen in right lung suggesting possible asymmetric interstitial pulmonary edema or interstitial pneumonia or an apparent change due to chest wall attenuation. Short-term follow-up  PA and lateral views of chest may be  considered. Electronically Signed   By: Elmer Picker M.D.   On: 10/29/2022 20:10     Discharge Instructions: Discharge Instructions     Call MD for:  difficulty breathing, headache or visual disturbances   Complete by: As directed    Call MD for:  extreme fatigue   Complete by: As directed    Call MD for:  persistant dizziness or light-headedness   Complete by: As directed    Call MD for:  persistant nausea and vomiting   Complete by: As directed    Call MD for:  redness, tenderness, or signs of infection (pain, swelling, redness, odor or green/yellow discharge around incision site)   Complete by: As directed    Call MD for:  severe uncontrolled pain   Complete by: As directed    Call MD for:  temperature >100.4   Complete by: As directed    Diet - low sodium heart healthy   Complete by: As directed    Discharge wound care:   Complete by: As directed    Please change the occlusive dressing on right AKA every other day.   Increase activity slowly   Complete by: As directed        Signed: Johny Blamer, DO 11/04/2022, 12:54 PM   Pager: [phone]

## 2022-11-04 NOTE — Progress Notes (Signed)
PICC line removed per order.  Pressure held to achieve hemostasis.  Vaseline/gauze/tegaderm applied.  Aftercare instructions reviewed with pt; pt verbalized understanding.

## 2022-11-08 ENCOUNTER — Telehealth: Payer: Self-pay | Admitting: Physician Assistant

## 2022-11-08 NOTE — Telephone Encounter (Signed)
-----   Message from Dagoberto Ligas, PA-C sent at 11/04/2022  9:05 AM EST ----- About 3 weeks for wound check with PA.  PO AKA Thanks

## 2022-11-28 ENCOUNTER — Ambulatory Visit (HOSPITAL_COMMUNITY): Payer: Medicare Other

## 2022-11-28 ENCOUNTER — Encounter: Payer: Medicare Other | Admitting: Surgery

## 2022-12-02 ENCOUNTER — Ambulatory Visit (INDEPENDENT_AMBULATORY_CARE_PROVIDER_SITE_OTHER): Payer: Medicare Other | Admitting: Physician Assistant

## 2022-12-02 ENCOUNTER — Encounter: Payer: Self-pay | Admitting: Physician Assistant

## 2022-12-02 VITALS — BP 160/79 | HR 62 | Temp 97.4°F | Resp 20 | Ht 63.0 in

## 2022-12-02 DIAGNOSIS — Z89611 Acquired absence of right leg above knee: Secondary | ICD-10-CM

## 2022-12-02 NOTE — Progress Notes (Signed)
POST OPERATIVE OFFICE NOTE    CC:  F/u for surgery  HPI:  This is a 71 y.o. female who is s/p right above-the-knee amputation by Dr. Virl Cagey on 10/31/2022.  She is currently in a rehab facility.  She states the incision is healing well and the staff tells her that her amputation site looks really good.  She denies any pain currently.  She is accompanied today by her daughter.  No Known Allergies  Current Outpatient Medications  Medication Sig Dispense Refill   acetaminophen (TYLENOL) 325 MG tablet Take 2 tablets (650 mg total) by mouth every 6 (six) hours as needed for mild pain (or Fever >/= 101).     amLODipine (NORVASC) 10 MG tablet Take 1 tablet (10 mg total) by mouth every morning.     cloNIDine (CATAPRES) 0.1 MG tablet Take 1 tablet (0.1 mg total) by mouth 2 (two) times daily. (Patient taking differently: Take 0.3 mg by mouth 2 (two) times daily.)     clopidogrel (PLAVIX) 75 MG tablet Take 75 mg by mouth daily.     furosemide (LASIX) 40 MG tablet Take 1 tablet (40 mg total) by mouth daily. 30 tablet    gabapentin (NEURONTIN) 100 MG capsule Take 100 mg by mouth 3 (three) times daily.     HUMALOG KWIKPEN 100 UNIT/ML KwikPen Inject into the skin See admin instructions. CBG 70 - 120: 0 units CBG 121 - 150: 3 units CBG 151 - 200: 4 units CBG 201 - 250: 6 units CBG 251 - 300: 8 units CBG 301 - 350: 10 units CBG 351 - 400: 12 units CBG 401 - 999: 14 units Call MD for BS>400, Subcutaneously before meals and at bedtime     hydrALAZINE (APRESOLINE) 25 MG tablet Take 1 tablet (25 mg total) by mouth 3 (three) times daily. 90 tablet 0   insulin glargine (LANTUS) 100 UNIT/ML injection Inject 0.15 mLs (15 Units total) into the skin 2 (two) times daily. (Patient taking differently: Inject 20 Units into the skin at bedtime.) 10 mL 11   ipratropium-albuterol (DUONEB) 0.5-2.5 (3) MG/3ML SOLN Take by nebulization.     labetalol (NORMODYNE) 100 MG tablet Take 100 mg by mouth 2 (two) times daily.  0    lidocaine (XYLOCAINE) 1 % (with preservative) injection SMARTSIG:Rectally     Multiple Vitamin (MULTIVITAMIN WITH MINERALS) TABS tablet Take 1 tablet by mouth daily.     polyethylene glycol (MIRALAX / GLYCOLAX) 17 g packet Take 17 g by mouth daily.     potassium chloride (MICRO-K) 10 MEQ CR capsule Take 20 mEq by mouth daily.     saccharomyces boulardii (FLORASTOR) 250 MG capsule Take 250 mg by mouth 2 (two) times daily. Probiotic capsule     senna (SENOKOT) 8.6 MG TABS tablet Take 2 tablets by mouth in the morning and at bedtime.     sertraline (ZOLOFT) 25 MG tablet Take 25 mg by mouth daily.     simvastatin (ZOCOR) 20 MG tablet Take 20 mg by mouth at bedtime.     traMADol (ULTRAM) 50 MG tablet Take 1 tablet (50 mg total) by mouth every 12 (twelve) hours as needed for moderate pain. 30 tablet 0   sitaGLIPtin (JANUVIA) 50 MG tablet Take 1 tablet (50 mg total) by mouth daily. 30 tablet 0   No current facility-administered medications for this visit.     ROS:  See HPI  Physical Exam:  Vitals:   12/02/22 1044  BP: (!) 160/79  Pulse: 62  Resp: 20  Temp: (!) 97.4 F (36.3 C)  TempSrc: Temporal  SpO2: 97%  Height: 5\' 3"  (1.6 m)    Incision: Right AKA healing well Neuro: A&O  Assessment/Plan:  This is a 71 y.o. female who is s/p: Right AKA  -Right AKA incision appears to be healing well -Every other staple removed in office today -Recommended cleansing wound with soap and water daily and then patting dry.  She will wear her stump sock to help with edema. -Follow-up in 1 to 2 weeks to remove the remainder of staples   Dagoberto Ligas, PA-C [facility] [phone]  Clinic MD:  Virl Cagey

## 2022-12-15 ENCOUNTER — Ambulatory Visit (INDEPENDENT_AMBULATORY_CARE_PROVIDER_SITE_OTHER): Payer: Medicare Other | Admitting: Physician Assistant

## 2022-12-15 VITALS — BP 193/88 | HR 68 | Temp 98.0°F | Resp 20 | Ht 63.0 in | Wt 158.0 lb

## 2022-12-15 DIAGNOSIS — Z89611 Acquired absence of right leg above knee: Secondary | ICD-10-CM

## 2022-12-15 NOTE — Progress Notes (Signed)
POST OPERATIVE OFFICE NOTE    CC:  F/u for surgery  HPI:  This is a 71 y.o. 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 Virl Cagey.  She was last seen on 12/02/22. At the time the AKA was healing well but it was a little early so only every other staple was removed.   Pt returns today for follow up and staple removal. Pt states that she feels wonderful. She denies any pain. She says that she has been doing very well in her therapy.   No Known Allergies  Current Outpatient Medications  Medication Sig Dispense Refill   acetaminophen (TYLENOL) 325 MG tablet Take 2 tablets (650 mg total) by mouth every 6 (six) hours as needed for mild pain (or Fever >/= 101).     amLODipine (NORVASC) 10 MG tablet Take 1 tablet (10 mg total) by mouth every morning.     cloNIDine (CATAPRES) 0.1 MG tablet Take 1 tablet (0.1 mg total) by mouth 2 (two) times daily. (Patient taking differently: Take 0.3 mg by mouth 2 (two) times daily.)     clopidogrel (PLAVIX) 75 MG tablet Take 75 mg by mouth daily.     furosemide (LASIX) 40 MG tablet Take 1 tablet (40 mg total) by mouth daily. 30 tablet    gabapentin (NEURONTIN) 100 MG capsule Take 100 mg by mouth 3 (three) times daily.     HUMALOG KWIKPEN 100 UNIT/ML KwikPen Inject into the skin See admin instructions. CBG 70 - 120: 0 units CBG 121 - 150: 3 units CBG 151 - 200: 4 units CBG 201 - 250: 6 units CBG 251 - 300: 8 units CBG 301 - 350: 10 units CBG 351 - 400: 12 units CBG 401 - 999: 14 units Call MD for BS>400, Subcutaneously before meals and at bedtime     hydrALAZINE (APRESOLINE) 25 MG tablet Take 1 tablet (25 mg total) by mouth 3 (three) times daily. 90 tablet 0   insulin glargine (LANTUS) 100 UNIT/ML injection Inject 0.15 mLs (15 Units total) into the skin 2 (two) times daily. (Patient taking differently: Inject 20 Units into the skin at bedtime.) 10 mL 11   ipratropium-albuterol (DUONEB) 0.5-2.5 (3) MG/3ML SOLN Take by nebulization.     labetalol (NORMODYNE)  100 MG tablet Take 100 mg by mouth 2 (two) times daily.  0   lidocaine (XYLOCAINE) 1 % (with preservative) injection SMARTSIG:Rectally     Multiple Vitamin (MULTIVITAMIN WITH MINERALS) TABS tablet Take 1 tablet by mouth daily.     polyethylene glycol (MIRALAX / GLYCOLAX) 17 g packet Take 17 g by mouth daily.     potassium chloride (MICRO-K) 10 MEQ CR capsule Take 20 mEq by mouth daily.     saccharomyces boulardii (FLORASTOR) 250 MG capsule Take 250 mg by mouth 2 (two) times daily. Probiotic capsule     senna (SENOKOT) 8.6 MG TABS tablet Take 2 tablets by mouth in the morning and at bedtime.     sertraline (ZOLOFT) 25 MG tablet Take 25 mg by mouth daily.     simvastatin (ZOCOR) 20 MG tablet Take 20 mg by mouth at bedtime.     traMADol (ULTRAM) 50 MG tablet Take 1 tablet (50 mg total) by mouth every 12 (twelve) hours as needed for moderate pain. 30 tablet 0   sitaGLIPtin (JANUVIA) 50 MG tablet Take 1 tablet (50 mg total) by mouth daily. 30 tablet 0   No current facility-administered medications for this visit.     ROS:  See HPI  Physical Exam:  Vitals:   12/15/22 1118  BP: (!) 193/88  Pulse: 68  Resp: 20  Temp: 98 F (36.7 C)  SpO2: 97%    General: very pleasant, in no discomfort Incision:  right AKA well healed, remaining staples removed Extremities:  well perfused and warm. Left leg is edematous. She does have chronic venous stasis changes to her LLE Neuro: alert and oriented   Assessment/Plan:  This is a 71 y.o. female who is s/p: right AKA on 10/31/22 by Dr. Virl Cagey. The remainder of her staples were removed today. Her AKA is healing very well. She does not have any pain. She does not have any interest in a prosthesis but did request a stump stocking so she was provided with Rx for this. She will follow up with [place] as needed if she has any new or concerning symptoms.   Paulo Fruit, [facility] [phone]   Clinic MD:  Donzetta Matters

## 2023-07-05 ENCOUNTER — Other Ambulatory Visit: Payer: Self-pay | Admitting: Nephrology

## 2023-07-05 DIAGNOSIS — E213 Hyperparathyroidism, unspecified: Secondary | ICD-10-CM

## 2023-10-05 ENCOUNTER — Other Ambulatory Visit (HOSPITAL_COMMUNITY): Payer: Self-pay | Admitting: Nephrology

## 2023-10-05 DIAGNOSIS — E213 Hyperparathyroidism, unspecified: Secondary | ICD-10-CM

## 2023-10-16 ENCOUNTER — Ambulatory Visit (HOSPITAL_COMMUNITY)
Admission: RE | Admit: 2023-10-16 | Discharge: 2023-10-16 | Disposition: A | Discharge status: Home / Self Care | Payer: Medicare Other | Source: Ambulatory Visit | Attending: Nephrology | Admitting: Nephrology

## 2023-10-16 ENCOUNTER — Encounter (HOSPITAL_COMMUNITY)
Admission: RE | Admit: 2023-10-16 | Discharge: 2023-10-16 | Disposition: A | Discharge status: Home / Self Care | Payer: Medicare Other | Source: Ambulatory Visit | Attending: Nephrology | Admitting: Nephrology

## 2023-10-16 DIAGNOSIS — E213 Hyperparathyroidism, unspecified: Secondary | ICD-10-CM

## 2023-10-16 MED ORDER — TECHNETIUM TC 99M SESTAMIBI GENERIC - CARDIOLITE
23.0000 | Freq: Once | INTRAVENOUS | Status: AC | PRN
  Administered 2023-10-16: 23 via INTRAVENOUS

## 2024-02-16 ENCOUNTER — Other Ambulatory Visit (HOSPITAL_COMMUNITY): Payer: Self-pay | Admitting: Nephrology

## 2024-02-16 DIAGNOSIS — E210 Primary hyperparathyroidism: Secondary | ICD-10-CM

## 2024-02-28 ENCOUNTER — Encounter (HOSPITAL_COMMUNITY): Payer: Self-pay

## 2024-02-28 ENCOUNTER — Encounter (HOSPITAL_COMMUNITY): Payer: Medicare Other

## 2024-03-19 ENCOUNTER — Encounter (HOSPITAL_COMMUNITY)
Admission: RE | Admit: 2024-03-19 | Discharge: 2024-03-19 | Disposition: A | Discharge status: Home / Self Care | Source: Ambulatory Visit | Attending: Nephrology | Admitting: Nephrology

## 2024-03-19 DIAGNOSIS — E210 Primary hyperparathyroidism: Secondary | ICD-10-CM | POA: Insufficient documentation

## 2024-03-19 MED ORDER — TECHNETIUM TC 99M SESTAMIBI - CARDIOLITE
25.9000 | Freq: Once | INTRAVENOUS | Status: AC | PRN
  Administered 2024-03-19: 25.9 via INTRAVENOUS

## 2024-06-05 ENCOUNTER — Encounter (HOSPITAL_COMMUNITY): Payer: Self-pay | Admitting: Surgery

## 2024-06-05 ENCOUNTER — Telehealth: Payer: Self-pay

## 2024-06-05 DIAGNOSIS — L97509 Non-pressure chronic ulcer of other part of unspecified foot with unspecified severity: Secondary | ICD-10-CM

## 2024-06-05 HISTORY — DX: Non-pressure chronic ulcer of other part of unspecified foot with unspecified severity: L97.509

## 2024-06-05 NOTE — Progress Notes (Addendum)
 For Anesthesia: PCP - Dr. Glover Larve Cardiologist -   Bowel Prep reminder:  Chest x-ray - greater than 1 year in [facility] EKG -  greater than 1 year in [facility] Stress Test -  ECHO - 05/05/2020 in [facility] Cardiac Cath -  Pacemaker/ICD device last checked: Pacemaker orders received: Device Rep notified:  Spinal Cord Stimulator:  Sleep Study -  CPAP -   Fasting Blood Sugar -  Checks Blood Sugar _____ times a day Date and result of last Hgb A1c-  Last dose of GLP1 agonist-  GLP1 instructions:   Last dose of SGLT-2 inhibitors-  SGLT-2 instructions:   Blood Thinner Instructions: Plavix  Aspirin Instructions: Last Dose:  Activity level: Can go up a flight of stairs and activities of daily living without stopping and without chest pain and/or shortness of breath   Able to exercise without chest pain and/or shortness of breath   Unable to go up a flight of stairs without chest pain and/or shortness of breath     Anesthesia review: CHF, History stroke, HTN, DM  Patient denies shortness of breath, fever, cough and chest pain at PAT appointment   Patient verbalized understanding of instructions that were reviewed over the telephone.

## 2024-06-05 NOTE — Progress Notes (Signed)
 Sent message, via epic in basket, requesting orders in epic from Careers adviser.

## 2024-06-05 NOTE — Telephone Encounter (Addendum)
 Lavaughn Portland, RN at [facility] called reporting pt having a new ulcer on pt's left great toe (06/04/24). Kelya reports the ulcer is very painful.  ABIs will be scheduled at [facility]. Appt with Dr. Christia Cowboy on 06/13/24 @ 0820.

## 2024-06-05 NOTE — Patient Instructions (Addendum)
 Preop instructions for: Angela Hays   Date of Birth: Jul 15, 1951                      Date of Procedure: Monday, June 10, 2024   Procedure:   EXPLORATION, PARATHYROID , PARATHYROIDECTOMY, EXCISION, MASS, CHEST WALL  Surgeon: Dr. [name] Facility contact: [facility]   Phone: [phone]                Health Care POA: RN contact name/phone#:  Berneta Brightly                         and Fax #: [phone]   Transportation contact phone#: [facility]  Please send day of procedure:  Current med list  Medications taken the day of procedure (return attached form to hospital) confirm time of nothing by mouth status (return attached form to hospital) Patient Demographic info( to include DNR status, problem list, allergies) Bring Insurance card and picture ID    Time to arrive at [facility]:    Report to: Admitting (On your left hand side)    Do not eat solid food past midnight the night before your procedure.(To include any tube feedings-must be discontinued)  May have the following until       AM/PM day of procedure  CLEAR LIQUID DIET  Water  Black Coffee (sugar ok, NO MILK/CREAM OR CREAMERS)  Tea (sugar ok, NO MILK/CREAM OR CREAMERS) regular and decaf                             Plain Jell-O (NO RED)                                           Fruit ices (not with fruit pulp, NO RED)                                     Popsicles (NO RED)                                                                  Juice: apple, WHITE grape, WHITE cranberry Sports drinks like Gatorade (NO RED)   Take these morning medications only with sips of water .(or give through gastrostomy or feeding tube).   Stop all vitamins and herbal supplements 7 days before surgery.   The Day Before Surgery:    Note: No Insulin  or Diabetic meds should be given or taken the morning of the procedure!  Oral Hygiene is also important to reduce your risk of infection.                                     Remember - BRUSH YOUR TEETH THE MORNING OF SURGERY WITH YOUR REGULAR TOOTHPASTE   DENTURES WILL BE REMOVED PRIOR TO SURGERY PLEASE DO NOT APPLY Poly grip OR ADHESIVES!!!  Leave all jewelry and other valuables at place where living( no metal  or rings to be worn) No contact lens Women-no make-up, no lotions,perfumes,powders Men-no colognes,lotions   Any questions day of procedure,call  SHORT STAY-[phone]     Sent from :[facility]                   Phone:[phone]                   Fax:[phone]   Sent by :               RN

## 2024-06-06 ENCOUNTER — Encounter (HOSPITAL_COMMUNITY): Payer: Self-pay | Admitting: Surgery

## 2024-06-06 ENCOUNTER — Ambulatory Visit: Payer: Self-pay | Admitting: Surgery

## 2024-06-06 NOTE — Progress Notes (Addendum)
 Preop instructions for:     Angela Hays Date of Birth:    04-15-2051                   Date of Procedure:   06-10-24 Procedure:  Exploration parathyroid , parathyroidectomy, excision of mass chest wall right Surgeon:  Dr. [name] Facility contact:     Phone:   [phone]  Fax# [phone]      Transportation contact phone#: [facility]  Please send day of procedure:  Current med list  Medications taken the day of procedure (return attached form to hospital) confirm time of nothing by mouth status (return attached form to hospital) Patient Demographic info( to include DNR status, problem list, allergies) Bring Insurance card and picture ID    Time to arrive at [facility]: 5:15 AM   Report to: Admitting (On your left hand side)    Do not eat solid food past midnight the night before your procedure.(To include any tube feedings-must be discontinued)  May have the following until 4:30 AM day of procedure  CLEAR LIQUID DIET  Water  Black Coffee (sugar ok, NO MILK/CREAM OR CREAMERS)  Tea (sugar ok, NO MILK/CREAM OR CREAMERS) regular and decaf                             Plain Jell-O (NO RED)                                           Fruit ices (not with fruit pulp, NO RED)                                     Popsicles (NO RED)                                                                  Juice: apple, WHITE grape, WHITE cranberry Sports drinks like Gatorade (NO RED)   Take these morning medications only with sips of water .(or give through gastrostomy or feeding tube).   Amlodipine , Clonidine , Gabapentin, Labetalol , Sertraline , Hydralazine , Doxycycline   Stop all vitamins and herbal supplements 7 days before surgery.  Hold Plavix  5 days before surgery (do not give after 06-04-24)   The Night Before Surgery:   Give 1/2 dose of Lantus  at bedtime No bedtime dose of Humalog (sliding scale)   Note: No Insulin  or Diabetic meds should be given or taken the  morning of the procedure!    If CBG 220 or higher give 1/2 dose of Humalog (sliding scale)  Oral Hygiene is also important to reduce your risk of infection.                                    Remember - BRUSH YOUR TEETH THE MORNING OF SURGERY WITH YOUR REGULAR TOOTHPASTE   DENTURES WILL BE REMOVED PRIOR TO SURGERY PLEASE DO NOT APPLY Poly grip OR ADHESIVES!!!  Leave all jewelry and other valuables at place where living(  no metal or rings to be worn) No contact lens Women-no make-up, no lotions,perfumes,powders Men-no colognes,lotions   Any questions day of procedure,call  SHORT STAY-[phone]     Sent from :[facility]                   Phone:[phone]                   [phone]   Sent by :   Henretta Lodge RN

## 2024-06-06 NOTE — Progress Notes (Addendum)
 Spoke to patient's daughter, Ferdie Housekeeper, she stated that she would be present day of surgery to sign consent.

## 2024-06-06 NOTE — Progress Notes (Signed)
 COVID Vaccine Completed:  Date of COVID positive in last 90 days:  PCP - Glover Larve, MD Cardiologist -   Chest x-ray -  EKG - DOS Stress Test -  ECHO - 05-05-20 Epic Cardiac Cath -  Pacemaker/ICD device last checked: Spinal Cord Stimulator:  Bowel Prep -   Sleep Study -  CPAP -   Fasting Blood Sugar - 140s to 190s Checks Blood Sugar  - 1 time a day  Last dose of GLP1 agonist-  N/A GLP1 instructions:  Hold 7 days before surgery    Last dose of SGLT-2 inhibitors-  N/A SGLT-2 instructions:  Hold 3 days before surgery   Blood Thinner Instructions:  Plavix  last dose on 06-04-24 AM per nursing hom Aspirin Instructions: Last Dose:  Activity level:  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  Anesthesia review: CHF, PVD, HTN, hx of stroke with hemiplegia, DM, CKD  Per nursing home has ulcer on toe left foot.  Patient was started on Rocephin  and Doxycycline  06-05-24.  Dr. Sofia Dunn has been notified.  Scheduled to see vascular on 06-10-24.  Patient denies shortness of breath, fever, cough and chest pain at PAT appointment  Patient verbalized understanding of instructions that were given to them at the PAT appointment. Patient was also instructed that they will need to review over the PAT instructions again at home before surgery.

## 2024-06-06 NOTE — H&P (Unsigned)
 Angela Hays

## 2024-06-06 NOTE — H&P (Signed)
 REFERRING PHYSICIAN: Whitney Hams, MD  PROVIDER: Lilyannah Zuelke Arcola Kocher, MD   Chief Complaint: New Consultation (hyperparathyroidism)  History of Present Illness:  Patient is referred by Dr. Shaune Delaine from nephrology for surgical evaluation and recommendations regarding hyperparathyroidism. Patient has had longstanding hypercalcemia. Calcium  levels have actually fluctuated significantly. Her most recent calcium  level is 9.1. It has been greater than 10 on multiple occasions. Patient has chronic kidney disease. Her recent intact PTH levels are markedly elevated at 760. This has been as high as 964. Patient has never been on hemodialysis. 25-hydroxy vitamin D level was slightly low at 25. Patient was sent for nuclear medicine parathyroid  scan performed in March 2025 which localized focal activity at the left lower position consistent with parathyroid  adenoma. Patient has also been managed on Sensipar and is currently taking 60 mg daily. Patient is accompanied today by her daughter. She lives at [facility] care center. Patient has had no prior head or neck surgery. There is a large soft tissue mass on the right anterior chest wall which extends near the base of the neck. This has been present for many years according to the patient and her daughter. Patient is now referred to discuss parathyroid  surgery.  Review of Systems: A complete review of systems was obtained from the patient. I have reviewed this information and discussed as appropriate with the patient. See HPI as well for other ROS.  Review of Systems  Constitutional: Negative.  HENT: Negative.  Eyes: Negative.  Respiratory: Negative.  Cardiovascular: Negative.  Gastrointestinal: Negative.  Genitourinary: Negative.  Musculoskeletal: Negative.  Skin: Negative.  Neurological: Negative.  Endo/Heme/Allergies: Negative.  Psychiatric/Behavioral: Negative.    Medical History: Past Medical History:  Diagnosis Date   Diabetes mellitus without complication (CMS/HHS-HCC)  Hyperlipidemia  Hypertension   Patient Active Problem List  Diagnosis  Hyperparathyroidism (CMS/HHS-HCC)  Soft tissue mass   History reviewed. No pertinent surgical history.   No Known Allergies  Current Outpatient Medications on File Prior to Visit  Medication Sig Dispense Refill  amLODIPine  (NORVASC ) 10 MG tablet  cholecalciferol 1000 unit tablet Take by mouth  cinacalcet (SENSIPAR) 60 MG tablet  cloNIDine  HCL (CATAPRES ) 0.1 MG tablet  clopidogreL  (PLAVIX ) 75 mg tablet Take 75 mg by mouth once daily  FUROsemide  (LASIX ) 40 MG tablet  gabapentin (NEURONTIN) 100 MG capsule Take 100 mg by mouth 3 (three) times daily  hydrALAZINE  (APRESOLINE ) 25 MG tablet Take 25 mg by mouth 3 (three) times daily  labetaloL  (TRANDATE ) 100 MG tablet  LANTUS  SOLOSTAR U-100 INSULIN  pen injector (concentration 100 units/mL)  LYUMJEV KWIKPEN U-100 INSULIN  100 unit/mL InPn  potassium chloride  (MICRO-K ) 10 MEQ ER capsule Take 20 mEq by mouth once daily  sennosides (SENOKOT) 8.6 mg tablet Take 1 tablet by mouth once daily  sertraline  (ZOLOFT ) 25 MG tablet Take 25 mg by mouth once daily  simvastatin  (ZOCOR ) 20 MG tablet Take 20 mg by mouth at bedtime   No current facility-administered medications on file prior to visit.   Family History  Problem Relation Age of Onset  Breast cancer Mother    Social History   Tobacco Use  Smoking Status Former  Types: Cigarettes  Start date: 2008  Smokeless Tobacco Never    Social History   Socioeconomic History  Marital status: Divorced  Tobacco Use  Smoking status: Former  Types: Cigarettes  Start date: 2008  Smokeless tobacco: Never  Substance and Sexual Activity  Alcohol use: Never  Drug use: Never  Objective:   Vitals:  BP: (!) 140/82  Pulse: 81  Temp: 36.7 C (98.1 F)  SpO2: 98%  Weight: 73.5 kg (162 lb)  Height: 160 cm (5' 3)  PainSc: 0-No pain   Body mass index is 28.7  kg/m.  Physical Exam   GENERAL APPEARANCE Comfortable, no acute issues Development: normal Gross deformities: none  SKIN Rash, lesions, ulcers: none Induration, erythema: none Nodules: none palpable  EYES Conjunctiva and lids: normal Pupils: equal  EARS, NOSE, MOUTH, THROAT External ears: no lesion or deformity External nose: no lesion or deformity Hearing: grossly normal  NECK Symmetric: yes Trachea: midline Thyroid: no palpable nodules in the thyroid bed  CHEST/CV There is a large soft tissue mass measuring approximately 14 x 12 x 5 cm overlying the right anterior upper chest wall and extending above the right clavicle. This is soft, discrete, and mobile, most likely representing a lipoma.  ABDOMEN Not assessed  GENITOURINARY/RECTAL Not assessed  LYMPHATIC Cervical: none palpable Supraclavicular: none palpable  PSYCHIATRIC Oriented to person, place, and time: yes Mood and affect: normal for situation Judgment and insight: appropriate for situation   Assessment and Plan:   Hyperparathyroidism (CMS/HHS-HCC) Soft tissue mass  Patient is referred by Dr. Shaune Delaine from nephrology for surgical evaluation and management of hyperparathyroidism.  Today we reviewed her clinical history. We reviewed recent laboratory studies. We reviewed her nuclear medicine parathyroid  scan from this year and from last year. It is somewhat difficult to tell whether or not this represents primary hyperparathyroidism versus secondary hyperparathyroidism. Patient has had some episodes of hypercalcemia. She is also currently taking Sensipar. She does have chronic kidney disease.  I have recommended proceeding with neck exploration and likely left inferior parathyroidectomy. If she has primary hyperparathyroidism, then it is likely she only has a single gland adenoma. However, if this is secondary hyperparathyroidism, there may be involvement of all 4 parathyroid  glands. Today I  discussed the approach to that with the patient and her daughter. We discussed performing a subtotal parathyroidectomy without autotransplantation. The patient also has a large soft tissue mass involving the right anterior chest wall. This extends into the lower part of the right neck. It is actually somewhat in the way of a approach to the thyroid and parathyroid  glands. Therefore I have recommended surgical excision concurrent with her neck exploration. We discussed the size and location of both incisions. We discussed the possibility of complications including recurrent laryngeal nerve injury. The patient's daughter would like to have her monitored overnight following surgery. I think this is appropriate. If all goes well, she could return to the health care center after breakfast on the first postoperative day.  We will proceed with scheduling the patient for her procedure.  [name], MD [facility] practice Office: [phone]

## 2024-06-07 NOTE — Progress Notes (Signed)
 Anesthesia Chart Review   Case: [id number] Date/Time: 06/10/24 0715   Procedures:      EXPLORATION, PARATHYROID      PARATHYROIDECTOMY - NECK EXPLORATION WITH PARATHYROIDECTOMY     EXCISION, MASS, CHEST WALL (Right) - EXCISION SOFIT TISSUE MASS RIGHT ANTERIOR CHEST WALL   Anesthesia type: General   Diagnosis:      Hyperparathyroidism, unspecified (HCC) [E21.3]     Swelling of limb [M79.89]   Pre-op diagnosis: HYPERPARATHYROIDISM  SOFT TISSUE MASSES   Location: [facility] ROOM 01 / [facility]   Surgeons: [name], MD       DISCUSSION:73 y.o. former smoker with h/o HTN, stroke with hemiplegia of left side, CKD, DM II, hyperparathyroidism, soft tissue masses scheduled for above procedure 06/10/2024 with Dr. [name].   Pt resides in nursing home, same day workup, evaluate DOS.   Per nursing home last dose of Plavix  06/04/2024.   Per nursing home has ulcer on toe left foot. Patient was started on Rocephin  and Doxycycline  06-05-24. Dr. Sofia Dunn has been notified. Scheduled to see vascular on 06-10-24.   Echo 05/05/2020 1. Left ventricular ejection fraction, by estimation, is 65 to 70%. The  left ventricle has normal function. The left ventricle has no regional  wall motion abnormalities. Left ventricular diastolic parameters are  consistent with Grade I diastolic  dysfunction (impaired relaxation). Elevated left atrial pressure.   2. Right ventricular systolic function is normal. The right ventricular  size is normal.   3. Left atrial size was mildly dilated.   4. The mitral valve is normal in structure. No evidence of mitral valve  regurgitation. No evidence of mitral stenosis.   5. The aortic valve is normal in structure. Aortic valve regurgitation is  not visualized. Mild aortic valve sclerosis is present, with no evidence  of aortic valve stenosis.   6. The inferior vena cava is normal in size with <50% respiratory  variability, suggesting right atrial pressure of 8 mmHg.  VS: There were  no vitals taken for this visit.  PROVIDERS: Pcp, No   LABS: labs DOS (all labs ordered are listed, but only abnormal results are displayed)  Labs Reviewed - No data to display   IMAGES:   EKG:   CV:   Past Medical History:  Diagnosis Date   Anemia    Anxiety    Chronic diastolic (congestive) heart failure (HCC)    Chronic kidney disease    Cognitive communication deficit    Depression    Diabetes mellitus without complication (HCC)    Hemiplegia (HCC)    Hyperkalemia 11/19/2019   Hyperlipidemia    Hyperparathyroidism (HCC)    Hypertension    Muscle weakness (generalized)    Obesity    Peripheral vascular disease (HCC)    Skin ulcer of great toe (HCC) 06/05/2024   Left   Stroke [facility])     Past Surgical History:  Procedure Laterality Date   AMPUTATION Right 10/31/2022   Procedure: RIGHT AMPUTATION ABOVE KNEE;  Surgeon: Kayla Part, MD;  Location: [facility] OR;  Service: Vascular;  Laterality: Right;   CATARACT EXTRACTION  07/17/2019   HERNIA REPAIR     Umbilical    MEDICATIONS: No current facility-administered medications for this encounter.    amLODipine  (NORVASC ) 10 MG tablet   cefTRIAXone  (ROCEPHIN ) 1 g injection   cholecalciferol (VITAMIN D3) 25 MCG (1000 UNIT) tablet   cloNIDine  (CATAPRES ) 0.1 MG tablet   doxycycline  (MONODOX ) 100 MG capsule   furosemide  (LASIX ) 40 MG tablet  gabapentin (NEURONTIN) 100 MG capsule   hydrALAZINE  (APRESOLINE ) 25 MG tablet   Insulin  Lispro-aabc (LYUMJEV KWIKPEN) 100 UNIT/ML KwikPen   labetalol  (NORMODYNE ) 100 MG tablet   LANTUS  SOLOSTAR 100 UNIT/ML Solostar Pen   potassium chloride  (MICRO-K ) 10 MEQ CR capsule   senna (SENOKOT) 8.6 MG TABS tablet   sertraline  (ZOLOFT ) 25 MG tablet   simvastatin  (ZOCOR ) 20 MG tablet   sitaGLIPtin  (JANUVIA ) 50 MG tablet   cinacalcet (SENSIPAR) 60 MG tablet   clopidogrel  (PLAVIX ) 75 MG tablet    [facility] Ward, PA-C [facility] [phone]

## 2024-06-09 ENCOUNTER — Encounter (HOSPITAL_COMMUNITY): Payer: Self-pay | Admitting: Surgery

## 2024-06-09 DIAGNOSIS — E210 Primary hyperparathyroidism: Secondary | ICD-10-CM | POA: Insufficient documentation

## 2024-06-09 NOTE — Anesthesia Preprocedure Evaluation (Signed)
 Anesthesia Evaluation  Patient identified by MRN, date of birth, ID band Patient awake    Reviewed: Allergy & Precautions, NPO status , Patient's Chart, lab work & pertinent test results, reviewed documented beta blocker date and time   History of Anesthesia Complications Negative for: history of anesthetic complications  Airway Mallampati: II  TM Distance: >3 FB Neck ROM: Full    Dental  (+) Edentulous Lower, Edentulous Upper   Pulmonary former smoker   Pulmonary exam normal        Cardiovascular hypertension, Pt. on medications and Pt. on home beta blockers + Peripheral Vascular Disease and +CHF  Normal cardiovascular exam  Echo 05/05/2020: EF 65-70%, grade I DD, mild LAE, valves ok    Neuro/Psych   Anxiety Depression    CVA (left hemiplegia), Residual Symptoms    GI/Hepatic negative GI ROS, Neg liver ROS,,,  Endo/Other  diabetes, Type 2, Insulin  Dependent, Oral Hypoglycemic Agents  Hyperparathyroidism  Renal/GU Renal InsufficiencyRenal disease  negative genitourinary   Musculoskeletal negative musculoskeletal ROS (+)    Abdominal   Peds  Hematology negative hematology ROS (+)   Anesthesia Other Findings Day of surgery medications reviewed with patient.  Reproductive/Obstetrics                              Anesthesia Physical Anesthesia Plan  ASA: 2  Anesthesia Plan: General   Post-op Pain Management: Tylenol  PO (pre-op)*   Induction: Intravenous  PONV Risk Score and Plan: 3 and Ondansetron , Dexamethasone  and Treatment may vary due to age or medical condition  Airway Management Planned: Oral ETT  Additional Equipment: None  Intra-op Plan:   Post-operative Plan: Extubation in OR  Informed Consent: I have reviewed the patients History and Physical, chart, labs and discussed the procedure including the risks, benefits and alternatives for the proposed anesthesia with the  patient or authorized representative who has indicated his/her understanding and acceptance.     Dental advisory given  Plan Discussed with: CRNA  Anesthesia Plan Comments:         Anesthesia Quick Evaluation

## 2024-06-09 NOTE — H&P (Signed)
 REFERRING PHYSICIAN: Whitney Hams, MD  PROVIDER: Lilyannah Zuelke Arcola Kocher, MD   Chief Complaint: New Consultation (hyperparathyroidism)  History of Present Illness:  Patient is referred by Dr. Shaune Delaine from nephrology for surgical evaluation and recommendations regarding hyperparathyroidism. Patient has had longstanding hypercalcemia. Calcium  levels have actually fluctuated significantly. Her most recent calcium  level is 9.1. It has been greater than 10 on multiple occasions. Patient has chronic kidney disease. Her recent intact PTH levels are markedly elevated at 760. This has been as high as 964. Patient has never been on hemodialysis. 25-hydroxy vitamin D level was slightly low at 25. Patient was sent for nuclear medicine parathyroid  scan performed in March 2025 which localized focal activity at the left lower position consistent with parathyroid  adenoma. Patient has also been managed on Sensipar and is currently taking 60 mg daily. Patient is accompanied today by her daughter. She lives at [facility] care center. Patient has had no prior head or neck surgery. There is a large soft tissue mass on the right anterior chest wall which extends near the base of the neck. This has been present for many years according to the patient and her daughter. Patient is now referred to discuss parathyroid  surgery.  Review of Systems: A complete review of systems was obtained from the patient. I have reviewed this information and discussed as appropriate with the patient. See HPI as well for other ROS.  Review of Systems  Constitutional: Negative.  HENT: Negative.  Eyes: Negative.  Respiratory: Negative.  Cardiovascular: Negative.  Gastrointestinal: Negative.  Genitourinary: Negative.  Musculoskeletal: Negative.  Skin: Negative.  Neurological: Negative.  Endo/Heme/Allergies: Negative.  Psychiatric/Behavioral: Negative.    Medical History: Past Medical History:  Diagnosis Date   Diabetes mellitus without complication (CMS/HHS-HCC)  Hyperlipidemia  Hypertension   Patient Active Problem List  Diagnosis  Hyperparathyroidism (CMS/HHS-HCC)  Soft tissue mass   History reviewed. No pertinent surgical history.   No Known Allergies  Current Outpatient Medications on File Prior to Visit  Medication Sig Dispense Refill  amLODIPine  (NORVASC ) 10 MG tablet  cholecalciferol 1000 unit tablet Take by mouth  cinacalcet (SENSIPAR) 60 MG tablet  cloNIDine  HCL (CATAPRES ) 0.1 MG tablet  clopidogreL  (PLAVIX ) 75 mg tablet Take 75 mg by mouth once daily  FUROsemide  (LASIX ) 40 MG tablet  gabapentin (NEURONTIN) 100 MG capsule Take 100 mg by mouth 3 (three) times daily  hydrALAZINE  (APRESOLINE ) 25 MG tablet Take 25 mg by mouth 3 (three) times daily  labetaloL  (TRANDATE ) 100 MG tablet  LANTUS  SOLOSTAR U-100 INSULIN  pen injector (concentration 100 units/mL)  LYUMJEV KWIKPEN U-100 INSULIN  100 unit/mL InPn  potassium chloride  (MICRO-K ) 10 MEQ ER capsule Take 20 mEq by mouth once daily  sennosides (SENOKOT) 8.6 mg tablet Take 1 tablet by mouth once daily  sertraline  (ZOLOFT ) 25 MG tablet Take 25 mg by mouth once daily  simvastatin  (ZOCOR ) 20 MG tablet Take 20 mg by mouth at bedtime   No current facility-administered medications on file prior to visit.   Family History  Problem Relation Age of Onset  Breast cancer Mother    Social History   Tobacco Use  Smoking Status Former  Types: Cigarettes  Start date: 2008  Smokeless Tobacco Never    Social History   Socioeconomic History  Marital status: Divorced  Tobacco Use  Smoking status: Former  Types: Cigarettes  Start date: 2008  Smokeless tobacco: Never  Substance and Sexual Activity  Alcohol use: Never  Drug use: Never  Objective:   Vitals:  BP: (!) 140/82  Pulse: 81  Temp: 36.7 C (98.1 F)  SpO2: 98%  Weight: 73.5 kg (162 lb)  Height: 160 cm (5' 3)  PainSc: 0-No pain   Body mass index is 28.7  kg/m.  Physical Exam   GENERAL APPEARANCE Comfortable, no acute issues Development: normal Gross deformities: none  SKIN Rash, lesions, ulcers: none Induration, erythema: none Nodules: none palpable  EYES Conjunctiva and lids: normal Pupils: equal  EARS, NOSE, MOUTH, THROAT External ears: no lesion or deformity External nose: no lesion or deformity Hearing: grossly normal  NECK Symmetric: yes Trachea: midline Thyroid: no palpable nodules in the thyroid bed  CHEST/CV There is a large soft tissue mass measuring approximately 14 x 12 x 5 cm overlying the right anterior upper chest wall and extending above the right clavicle. This is soft, discrete, and mobile, most likely representing a lipoma.  ABDOMEN Not assessed  GENITOURINARY/RECTAL Not assessed  LYMPHATIC Cervical: none palpable Supraclavicular: none palpable  PSYCHIATRIC Oriented to person, place, and time: yes Mood and affect: normal for situation Judgment and insight: appropriate for situation   Assessment and Plan:   Hyperparathyroidism (CMS/HHS-HCC) Soft tissue mass  Patient is referred by Dr. Shaune Delaine from nephrology for surgical evaluation and management of hyperparathyroidism.  Today we reviewed her clinical history. We reviewed recent laboratory studies. We reviewed her nuclear medicine parathyroid  scan from this year and from last year. It is somewhat difficult to tell whether or not this represents primary hyperparathyroidism versus secondary hyperparathyroidism. Patient has had some episodes of hypercalcemia. She is also currently taking Sensipar. She does have chronic kidney disease.  I have recommended proceeding with neck exploration and likely left inferior parathyroidectomy. If she has primary hyperparathyroidism, then it is likely she only has a single gland adenoma. However, if this is secondary hyperparathyroidism, there may be involvement of all 4 parathyroid  glands. Today I  discussed the approach to that with the patient and her daughter. We discussed performing a subtotal parathyroidectomy without autotransplantation. The patient also has a large soft tissue mass involving the right anterior chest wall. This extends into the lower part of the right neck. It is actually somewhat in the way of a approach to the thyroid and parathyroid  glands. Therefore I have recommended surgical excision concurrent with her neck exploration. We discussed the size and location of both incisions. We discussed the possibility of complications including recurrent laryngeal nerve injury. The patient's daughter would like to have her monitored overnight following surgery. I think this is appropriate. If all goes well, she could return to the health care center after breakfast on the first postoperative day.  We will proceed with scheduling the patient for her procedure.  [name], MD [facility] practice Office: [phone]

## 2024-06-10 ENCOUNTER — Ambulatory Visit (HOSPITAL_COMMUNITY): Payer: Self-pay | Admitting: Physician Assistant

## 2024-06-10 ENCOUNTER — Other Ambulatory Visit: Payer: Self-pay

## 2024-06-10 ENCOUNTER — Ambulatory Visit (HOSPITAL_COMMUNITY)
Admission: RE | Admit: 2024-06-10 | Discharge: 2024-06-11 | Disposition: A | Discharge status: Skilled Nursing Facility | Source: Ambulatory Visit | Attending: Surgery | Admitting: Surgery

## 2024-06-10 ENCOUNTER — Encounter (HOSPITAL_COMMUNITY): Payer: Self-pay | Admitting: Surgery

## 2024-06-10 ENCOUNTER — Encounter (HOSPITAL_COMMUNITY)
Admission: RE | Disposition: A | Discharge status: Skilled Nursing Facility | Payer: Self-pay | Source: Ambulatory Visit | Attending: Surgery

## 2024-06-10 ENCOUNTER — Ambulatory Visit (HOSPITAL_BASED_OUTPATIENT_CLINIC_OR_DEPARTMENT_OTHER): Payer: Self-pay | Admitting: Physician Assistant

## 2024-06-10 DIAGNOSIS — E1151 Type 2 diabetes mellitus with diabetic peripheral angiopathy without gangrene: Secondary | ICD-10-CM | POA: Diagnosis not present

## 2024-06-10 DIAGNOSIS — D171 Benign lipomatous neoplasm of skin and subcutaneous tissue of trunk: Secondary | ICD-10-CM | POA: Insufficient documentation

## 2024-06-10 DIAGNOSIS — E1122 Type 2 diabetes mellitus with diabetic chronic kidney disease: Secondary | ICD-10-CM | POA: Diagnosis not present

## 2024-06-10 DIAGNOSIS — I10 Essential (primary) hypertension: Secondary | ICD-10-CM

## 2024-06-10 DIAGNOSIS — Z79899 Other long term (current) drug therapy: Secondary | ICD-10-CM | POA: Diagnosis not present

## 2024-06-10 DIAGNOSIS — I5032 Chronic diastolic (congestive) heart failure: Secondary | ICD-10-CM | POA: Diagnosis not present

## 2024-06-10 DIAGNOSIS — I509 Heart failure, unspecified: Secondary | ICD-10-CM | POA: Diagnosis not present

## 2024-06-10 DIAGNOSIS — I130 Hypertensive heart and chronic kidney disease with heart failure and stage 1 through stage 4 chronic kidney disease, or unspecified chronic kidney disease: Secondary | ICD-10-CM | POA: Insufficient documentation

## 2024-06-10 DIAGNOSIS — Z01818 Encounter for other preprocedural examination: Secondary | ICD-10-CM

## 2024-06-10 DIAGNOSIS — Z7902 Long term (current) use of antithrombotics/antiplatelets: Secondary | ICD-10-CM | POA: Diagnosis not present

## 2024-06-10 DIAGNOSIS — N189 Chronic kidney disease, unspecified: Secondary | ICD-10-CM | POA: Insufficient documentation

## 2024-06-10 DIAGNOSIS — Z794 Long term (current) use of insulin: Secondary | ICD-10-CM | POA: Diagnosis not present

## 2024-06-10 DIAGNOSIS — E210 Primary hyperparathyroidism: Secondary | ICD-10-CM | POA: Diagnosis not present

## 2024-06-10 DIAGNOSIS — I69954 Hemiplegia and hemiparesis following unspecified cerebrovascular disease affecting left non-dominant side: Secondary | ICD-10-CM | POA: Insufficient documentation

## 2024-06-10 DIAGNOSIS — M7989 Other specified soft tissue disorders: Secondary | ICD-10-CM | POA: Diagnosis present

## 2024-06-10 DIAGNOSIS — Z7984 Long term (current) use of oral hypoglycemic drugs: Secondary | ICD-10-CM | POA: Insufficient documentation

## 2024-06-10 DIAGNOSIS — Z87891 Personal history of nicotine dependence: Secondary | ICD-10-CM | POA: Diagnosis not present

## 2024-06-10 DIAGNOSIS — E119 Type 2 diabetes mellitus without complications: Secondary | ICD-10-CM

## 2024-06-10 HISTORY — DX: Hyperlipidemia, unspecified: E78.5

## 2024-06-10 HISTORY — PX: PARATHYROIDECTOMY: SHX19

## 2024-06-10 HISTORY — DX: Cognitive communication deficit: R41.841

## 2024-06-10 HISTORY — DX: Hemiplegia, unspecified affecting unspecified side: G81.90

## 2024-06-10 HISTORY — DX: Depression, unspecified: F32.A

## 2024-06-10 HISTORY — DX: Chronic kidney disease, unspecified: N18.9

## 2024-06-10 HISTORY — DX: Obesity, unspecified: E66.9

## 2024-06-10 HISTORY — PX: MASS EXCISION: SHX2000

## 2024-06-10 HISTORY — PX: PARATHYROID EXPLORATION: SHX732

## 2024-06-10 HISTORY — DX: Muscle weakness (generalized): M62.81

## 2024-06-10 HISTORY — DX: Hyperparathyroidism, unspecified: E21.3

## 2024-06-10 HISTORY — DX: Anemia, unspecified: D64.9

## 2024-06-10 HISTORY — DX: Anxiety disorder, unspecified: F41.9

## 2024-06-10 HISTORY — DX: Peripheral vascular disease, unspecified: I73.9

## 2024-06-10 LAB — CBC
HCT: 41.5 % (ref 36.0–46.0)
Hemoglobin: 12.8 g/dL (ref 12.0–15.0)
MCH: 28.7 pg (ref 26.0–34.0)
MCHC: 30.8 g/dL (ref 30.0–36.0)
MCV: 93 fL (ref 80.0–100.0)
Platelets: 206 10*3/uL (ref 150–400)
RBC: 4.46 MIL/uL (ref 3.87–5.11)
RDW: 14.8 % (ref 11.5–15.5)
WBC: 9.5 10*3/uL (ref 4.0–10.5)
nRBC: 0 % (ref 0.0–0.2)

## 2024-06-10 LAB — BASIC METABOLIC PANEL WITH GFR
Anion gap: 11 (ref 5–15)
BUN: 54 mg/dL — ABNORMAL HIGH (ref 8–23)
CO2: 24 mmol/L (ref 22–32)
Calcium: 9.9 mg/dL (ref 8.9–10.3)
Chloride: 107 mmol/L (ref 98–111)
Creatinine, Ser: 2.2 mg/dL — ABNORMAL HIGH (ref 0.44–1.00)
GFR, Estimated: 23 mL/min — ABNORMAL LOW (ref >60)
Glucose, Bld: 135 mg/dL — ABNORMAL HIGH (ref 70–99)
Potassium: 4.1 mmol/L (ref 3.5–5.1)
Sodium: 142 mmol/L (ref 135–145)

## 2024-06-10 LAB — GLUCOSE, CAPILLARY
Glucose-Capillary: 116 mg/dL — ABNORMAL HIGH (ref 70–99)
Glucose-Capillary: 118 mg/dL — ABNORMAL HIGH (ref 70–99)
Glucose-Capillary: 155 mg/dL — ABNORMAL HIGH (ref 70–99)
Glucose-Capillary: 150 mg/dL — ABNORMAL HIGH (ref 70–99)
Glucose-Capillary: 159 mg/dL — ABNORMAL HIGH (ref 70–99)

## 2024-06-10 LAB — SURGICAL PCR SCREEN
MRSA, PCR: NEGATIVE
Staphylococcus aureus: NEGATIVE

## 2024-06-10 SURGERY — EXPLORATION, PARATHYROID
Anesthesia: General | Site: Neck | Laterality: Right

## 2024-06-10 MED ORDER — ONDANSETRON HCL 4 MG/2ML IJ SOLN
INTRAMUSCULAR | Status: DC | PRN
Start: 2024-06-10 — End: 2024-06-10
  Administered 2024-06-10: 4 mg via INTRAVENOUS

## 2024-06-10 MED ORDER — PROPOFOL 10 MG/ML IV BOLUS
INTRAVENOUS | Status: DC | PRN
  Administered 2024-06-10: 120 mg via INTRAVENOUS

## 2024-06-10 MED ORDER — ONDANSETRON 4 MG PO TBDP: 4.0000 mg | ORAL_TABLET | Freq: Four times a day (QID) | ORAL | Status: DC | PRN

## 2024-06-10 MED ORDER — ONDANSETRON HCL 4 MG/2ML IJ SOLN
4.0000 mg | Freq: Four times a day (QID) | INTRAMUSCULAR | Status: DC | PRN
Start: 2024-06-10 — End: 2024-06-11
  Filled 2024-06-10: qty 2

## 2024-06-10 MED ORDER — LIDOCAINE HCL 2 % IJ SOLN
INTRAMUSCULAR | Status: AC
  Filled 2024-06-10: qty 20

## 2024-06-10 MED ORDER — PROPOFOL 10 MG/ML IV BOLUS
INTRAVENOUS | Status: AC
Start: 2024-06-10 — End: 2024-06-10
  Filled 2024-06-10: qty 20

## 2024-06-10 MED ORDER — LACTATED RINGERS IV SOLN: INTRAVENOUS | Status: DC

## 2024-06-10 MED ORDER — EPHEDRINE 5 MG/ML INJ
INTRAVENOUS | Status: AC
  Filled 2024-06-10: qty 5

## 2024-06-10 MED ORDER — GABAPENTIN 100 MG PO CAPS
100.0000 mg | ORAL_CAPSULE | Freq: Three times a day (TID) | ORAL | Status: DC
  Administered 2024-06-10 – 2024-06-11 (×4): 100 mg via ORAL
  Filled 2024-06-10 (×4): qty 1

## 2024-06-10 MED ORDER — CHLORHEXIDINE GLUCONATE CLOTH 2 % EX PADS: 6.0000 | MEDICATED_PAD | Freq: Once | CUTANEOUS | Status: DC

## 2024-06-10 MED ORDER — AMLODIPINE BESYLATE 10 MG PO TABS
10.0000 mg | ORAL_TABLET | Freq: Every morning | ORAL | Status: DC
  Administered 2024-06-10 – 2024-06-11 (×2): 10 mg via ORAL
  Filled 2024-06-10 (×2): qty 1

## 2024-06-10 MED ORDER — FUROSEMIDE 40 MG PO TABS
40.0000 mg | ORAL_TABLET | Freq: Two times a day (BID) | ORAL | Status: DC
  Administered 2024-06-10 – 2024-06-11 (×2): 40 mg via ORAL
  Filled 2024-06-10 (×2): qty 1

## 2024-06-10 MED ORDER — FENTANYL CITRATE (PF) 100 MCG/2ML IJ SOLN
INTRAMUSCULAR | Status: AC
Start: 2024-06-10 — End: 2024-06-10
  Filled 2024-06-10: qty 2

## 2024-06-10 MED ORDER — FENTANYL CITRATE (PF) 100 MCG/2ML IJ SOLN: INTRAMUSCULAR | Status: DC | PRN

## 2024-06-10 MED ORDER — 0.9 % SODIUM CHLORIDE (POUR BTL) OPTIME
TOPICAL | Status: DC | PRN
  Administered 2024-06-10: 1000 mL

## 2024-06-10 MED ORDER — ONDANSETRON HCL 4 MG/2ML IJ SOLN
INTRAMUSCULAR | Status: AC
  Filled 2024-06-10: qty 2

## 2024-06-10 MED ORDER — ROCURONIUM BROMIDE 10 MG/ML (PF) SYRINGE
PREFILLED_SYRINGE | INTRAVENOUS | Status: AC
  Filled 2024-06-10: qty 10

## 2024-06-10 MED ORDER — HEMOSTATIC AGENTS (NO CHARGE) OPTIME
TOPICAL | Status: DC | PRN
  Administered 2024-06-10: 1

## 2024-06-10 MED ORDER — PROPOFOL 500 MG/50ML IV EMUL
INTRAVENOUS | Status: AC
  Filled 2024-06-10: qty 50

## 2024-06-10 MED ORDER — TRAMADOL HCL 50 MG PO TABS: 50.0000 mg | ORAL_TABLET | Freq: Four times a day (QID) | ORAL | Status: DC | PRN

## 2024-06-10 MED ORDER — DROPERIDOL 2.5 MG/ML IJ SOLN: 0.6250 mg | Freq: Once | INTRAMUSCULAR | Status: DC | PRN

## 2024-06-10 MED ORDER — ROCURONIUM BROMIDE 10 MG/ML (PF) SYRINGE
PREFILLED_SYRINGE | INTRAVENOUS | Status: DC | PRN
Start: 2024-06-10 — End: 2024-06-10
  Administered 2024-06-10: 10 mg via INTRAVENOUS
  Administered 2024-06-10: 60 mg via INTRAVENOUS

## 2024-06-10 MED ORDER — FENTANYL CITRATE PF 50 MCG/ML IJ SOSY: 25.0000 ug | PREFILLED_SYRINGE | INTRAMUSCULAR | Status: DC | PRN

## 2024-06-10 MED ORDER — BUPIVACAINE HCL (PF) 0.25 % IJ SOLN
INTRAMUSCULAR | Status: AC
  Filled 2024-06-10: qty 30

## 2024-06-10 MED ORDER — ACETAMINOPHEN 650 MG RE SUPP: 650.0000 mg | Freq: Four times a day (QID) | RECTAL | Status: DC | PRN

## 2024-06-10 MED ORDER — INSULIN ASPART 100 UNIT/ML IJ SOLN
0.0000 [IU] | Freq: Three times a day (TID) | INTRAMUSCULAR | Status: DC
  Administered 2024-06-10: 3 [IU] via SUBCUTANEOUS
  Administered 2024-06-10 – 2024-06-11 (×2): 2 [IU] via SUBCUTANEOUS

## 2024-06-10 MED ORDER — PHENYLEPHRINE HCL-NACL 20-0.9 MG/250ML-% IV SOLN
INTRAVENOUS | Status: DC | PRN
Start: 2024-06-10 — End: 2024-06-10
  Administered 2024-06-10: 50 ug/min via INTRAVENOUS

## 2024-06-10 MED ORDER — BUPIVACAINE HCL 0.25 % IJ SOLN
INTRAMUSCULAR | Status: DC | PRN
  Administered 2024-06-10: 10 mL

## 2024-06-10 MED ORDER — HYDROMORPHONE HCL 1 MG/ML IJ SOLN: 1.0000 mg | INTRAMUSCULAR | Status: DC | PRN

## 2024-06-10 MED ORDER — SUGAMMADEX SODIUM 200 MG/2ML IV SOLN
INTRAVENOUS | Status: DC | PRN
  Administered 2024-06-10: 200 mg via INTRAVENOUS

## 2024-06-10 MED ORDER — EPHEDRINE SULFATE (PRESSORS) 50 MG/ML IJ SOLN
INTRAMUSCULAR | Status: DC | PRN
  Administered 2024-06-10: 10 mg via INTRAVENOUS

## 2024-06-10 MED ORDER — HYDRALAZINE HCL 25 MG PO TABS
25.0000 mg | ORAL_TABLET | Freq: Three times a day (TID) | ORAL | Status: DC
  Administered 2024-06-10 – 2024-06-11 (×4): 25 mg via ORAL
  Filled 2024-06-10 (×4): qty 1

## 2024-06-10 MED ORDER — CLONIDINE HCL 0.1 MG PO TABS
0.1000 mg | ORAL_TABLET | Freq: Two times a day (BID) | ORAL | Status: DC
  Administered 2024-06-10 – 2024-06-11 (×3): 0.1 mg via ORAL
  Filled 2024-06-10 (×3): qty 1

## 2024-06-10 MED ORDER — CHLORHEXIDINE GLUCONATE 0.12 % MT SOLN
15.0000 mL | Freq: Once | OROMUCOSAL | Status: AC
  Administered 2024-06-10: 15 mL via OROMUCOSAL

## 2024-06-10 MED ORDER — OXYCODONE HCL 5 MG PO TABS: 5.0000 mg | ORAL_TABLET | Freq: Once | ORAL | Status: DC | PRN

## 2024-06-10 MED ORDER — SENNA 8.6 MG PO TABS
1.0000 | ORAL_TABLET | Freq: Every day | ORAL | Status: DC
  Administered 2024-06-10 – 2024-06-11 (×2): 8.6 mg via ORAL
  Filled 2024-06-10 (×2): qty 1

## 2024-06-10 MED ORDER — LABETALOL HCL 100 MG PO TABS
100.0000 mg | ORAL_TABLET | Freq: Two times a day (BID) | ORAL | Status: DC
  Administered 2024-06-10 – 2024-06-11 (×3): 100 mg via ORAL
  Filled 2024-06-10 (×3): qty 1

## 2024-06-10 MED ORDER — SERTRALINE HCL 25 MG PO TABS
12.5000 mg | ORAL_TABLET | Freq: Every day | ORAL | Status: DC
  Administered 2024-06-10 – 2024-06-11 (×2): 12.5 mg via ORAL
  Filled 2024-06-10 (×2): qty 1

## 2024-06-10 MED ORDER — INSULIN ASPART 100 UNIT/ML IJ SOLN: 0.0000 [IU] | INTRAMUSCULAR | Status: DC | PRN

## 2024-06-10 MED ORDER — POTASSIUM CHLORIDE CRYS ER 10 MEQ PO TBCR
10.0000 meq | EXTENDED_RELEASE_TABLET | Freq: Every day | ORAL | Status: DC
  Administered 2024-06-10 – 2024-06-11 (×2): 10 meq via ORAL
  Filled 2024-06-10 (×2): qty 1

## 2024-06-10 MED ORDER — DEXAMETHASONE SODIUM PHOSPHATE 10 MG/ML IJ SOLN
INTRAMUSCULAR | Status: AC
  Filled 2024-06-10: qty 1

## 2024-06-10 MED ORDER — LINAGLIPTIN 5 MG PO TABS
5.0000 mg | ORAL_TABLET | Freq: Every day | ORAL | Status: DC
  Administered 2024-06-10 – 2024-06-11 (×2): 5 mg via ORAL
  Filled 2024-06-10 (×2): qty 1

## 2024-06-10 MED ORDER — OXYCODONE HCL 5 MG PO TABS: 5.0000 mg | ORAL_TABLET | ORAL | Status: DC | PRN

## 2024-06-10 MED ORDER — LIDOCAINE HCL (PF) 2 % IJ SOLN
INTRAMUSCULAR | Status: DC | PRN
  Administered 2024-06-10: 100 mg via INTRADERMAL

## 2024-06-10 MED ORDER — ACETAMINOPHEN 500 MG PO TABS
1000.0000 mg | ORAL_TABLET | Freq: Once | ORAL | Status: AC
  Administered 2024-06-10: 1000 mg via ORAL
  Filled 2024-06-10: qty 2

## 2024-06-10 MED ORDER — CEFAZOLIN SODIUM-DEXTROSE 2-4 GM/100ML-% IV SOLN
2.0000 g | INTRAVENOUS | Status: AC
  Administered 2024-06-10: 2 g via INTRAVENOUS
  Filled 2024-06-10: qty 100

## 2024-06-10 MED ORDER — PHENYLEPHRINE 80 MCG/ML (10ML) SYRINGE FOR IV PUSH (FOR BLOOD PRESSURE SUPPORT)
PREFILLED_SYRINGE | INTRAVENOUS | Status: AC
  Filled 2024-06-10: qty 10

## 2024-06-10 MED ORDER — ORAL CARE MOUTH RINSE
15.0000 mL | Freq: Once | OROMUCOSAL | Status: AC
Start: 2024-06-10 — End: 2024-06-10

## 2024-06-10 MED ORDER — SODIUM CHLORIDE 0.45 % IV SOLN: INTRAVENOUS | Status: AC

## 2024-06-10 MED ORDER — ACETAMINOPHEN 325 MG PO TABS
650.0000 mg | ORAL_TABLET | Freq: Four times a day (QID) | ORAL | Status: DC | PRN
  Filled 2024-06-10: qty 2

## 2024-06-10 MED ORDER — OXYCODONE HCL 5 MG/5ML PO SOLN: 5.0000 mg | Freq: Once | ORAL | Status: DC | PRN

## 2024-06-10 MED ORDER — DEXAMETHASONE SODIUM PHOSPHATE 10 MG/ML IJ SOLN
INTRAMUSCULAR | Status: DC | PRN
Start: 2024-06-10 — End: 2024-06-10
  Administered 2024-06-10: 4 mg via INTRAVENOUS

## 2024-06-10 SURGICAL SUPPLY — 29 items
ATTRACTOMAT 16X20 MAGNETIC DRP (DRAPES) ×2 IMPLANT
BAG COUNTER SPONGE SURGICOUNT (BAG) ×2 IMPLANT
BLADE SURG 15 STRL LF DISP TIS (BLADE) ×2 IMPLANT
CHLORAPREP W/TINT 26 (MISCELLANEOUS) ×2 IMPLANT
CLIP TI MEDIUM 6 (CLIP) ×4 IMPLANT
CLIP TI WIDE RED SMALL 6 (CLIP) ×4 IMPLANT
COVER SURGICAL LIGHT HANDLE (MISCELLANEOUS) ×2 IMPLANT
DERMABOND ADVANCED .7 DNX12 (GAUZE/BANDAGES/DRESSINGS) ×2 IMPLANT
DRAPE LAPAROTOMY T 98X78 PEDS (DRAPES) ×2 IMPLANT
DRAPE UTILITY XL STRL (DRAPES) ×2 IMPLANT
ELECT REM PT RETURN 15FT ADLT (MISCELLANEOUS) ×2 IMPLANT
GAUZE 4X4 16PLY ~~LOC~~+RFID DBL (SPONGE) ×2 IMPLANT
GLOVE SURG ORTHO 8.0 STRL STRW (GLOVE) ×2 IMPLANT
GOWN STRL REUS W/ TWL XL LVL3 (GOWN DISPOSABLE) ×6 IMPLANT
HEMOSTAT SURGICEL 2X4 FIBR (HEMOSTASIS) ×2 IMPLANT
ILLUMINATOR WAVEGUIDE N/F (MISCELLANEOUS) IMPLANT
KIT BASIN OR (CUSTOM PROCEDURE TRAY) ×2 IMPLANT
KIT TURNOVER KIT A (KITS) ×4 IMPLANT
NDL HYPO 22X1.5 SAFETY MO (MISCELLANEOUS) ×2 IMPLANT
NEEDLE HYPO 22X1.5 SAFETY MO (MISCELLANEOUS) ×2 IMPLANT
PACK BASIC VI WITH GOWN DISP (CUSTOM PROCEDURE TRAY) ×2 IMPLANT
PENCIL SMOKE EVACUATOR (MISCELLANEOUS) ×2 IMPLANT
SHEARS HARMONIC 9CM CVD (BLADE) IMPLANT
SUT MNCRL AB 4-0 PS2 18 (SUTURE) ×2 IMPLANT
SUT VIC AB 3-0 SH 18 (SUTURE) ×2 IMPLANT
SYR BULB IRRIG 60ML STRL (SYRINGE) ×2 IMPLANT
SYR CONTROL 10ML LL (SYRINGE) ×2 IMPLANT
TOWEL OR 17X26 10 PK STRL BLUE (TOWEL DISPOSABLE) ×2 IMPLANT
TUBING CONNECTING 10 (TUBING) ×2 IMPLANT

## 2024-06-10 NOTE — Plan of Care (Signed)

## 2024-06-10 NOTE — Addendum Note (Signed)
 Addendum  created 06/10/24 1053 by Alfreda Imus, CRNA   Flowsheet accepted, Intraprocedure Flowsheets edited

## 2024-06-10 NOTE — Interval H&P Note (Signed)
 History and Physical Interval Note:  06/10/2024 7:04 AM  Angela Hays  has presented today for surgery, with the diagnosis of HYPERPARATHYROIDISM  SOFT TISSUE MASSES.  The various methods of treatment have been discussed with the patient and family. After consideration of risks, benefits and other options for treatment, the patient has consented to    Procedure(s) with comments: EXPLORATION, PARATHYROID  (N/A) PARATHYROIDECTOMY (N/A) - NECK EXPLORATION WITH PARATHYROIDECTOMY EXCISION, MASS, CHEST WALL (Right) - EXCISION SOFIT TISSUE MASS RIGHT ANTERIOR CHEST WALL as a surgical intervention.    The patient's history has been reviewed, patient examined, no change in status, stable for surgery.  I have reviewed the patient's chart and labs.  Questions were answered to the patient's satisfaction.    [name], MD [facility] practice Office: [phone]   [name]

## 2024-06-10 NOTE — Transfer of Care (Signed)
 Immediate Anesthesia Transfer of Care Note  Patient: Angela Hays  Procedure(s) Performed: EXPLORATION, PARATHYROID  (Neck) PARATHYROIDECTOMY (Neck) EXCISION, MASS, CHEST WALL (Right: Chest)  Patient Location: PACU  Anesthesia Type:General  Level of Consciousness: awake, alert , and oriented  Airway & Oxygen Therapy: Patient Spontanous Breathing and Patient connected to face mask oxygen  Post-op Assessment: Report given to RN and Post -op Vital signs reviewed and stable  Post vital signs: Reviewed and stable  Last Vitals:  Vitals Value Taken Time  BP 194/84 06/10/24 09:39  Temp    Pulse 66 06/10/24 09:42  Resp 19 06/10/24 09:42  SpO2 100 % 06/10/24 09:42  Vitals shown include unfiled device data.  Last Pain:  Vitals:   06/10/24 0554  TempSrc: Oral         Complications: No notable events documented.

## 2024-06-10 NOTE — Anesthesia Postprocedure Evaluation (Signed)
 Anesthesia Post Note  Patient: Angela Hays  Procedure(s) Performed: EXPLORATION, PARATHYROID  (Neck) PARATHYROIDECTOMY (Neck) EXCISION, MASS, CHEST WALL (Right: Chest)     Patient location during evaluation: PACU Anesthesia Type: General Level of consciousness: awake and alert Pain management: pain level controlled Vital Signs Assessment: post-procedure vital signs reviewed and stable Respiratory status: spontaneous breathing, nonlabored ventilation and respiratory function stable Cardiovascular status: blood pressure returned to baseline Postop Assessment: no apparent nausea or vomiting Anesthetic complications: no   No notable events documented.  Last Vitals:  Vitals:   06/10/24 0945 06/10/24 1000  BP: (!) 193/76 (!) 175/75  Pulse: 67 69  Resp: 19 (!) 27  Temp:    SpO2: 100% 96%    Last Pain:  Vitals:   06/10/24 1000  TempSrc:   PainSc: 0-No pain                 Rayfield Cairo

## 2024-06-10 NOTE — Anesthesia Procedure Notes (Signed)
 Procedure Name: Intubation Date/Time: 06/10/2024 7:58 AM  Performed by: Alfreda Imus, CRNAPre-anesthesia Checklist: Patient identified, Emergency Drugs available, Suction available, Patient being monitored and Timeout performed Patient Re-evaluated:Patient Re-evaluated prior to induction Oxygen Delivery Method: Circle system utilized Preoxygenation: Pre-oxygenation with 100% oxygen Induction Type: IV induction Ventilation: Oral airway inserted - appropriate to patient size Laryngoscope Size: Glidescope and 3 Grade View: Grade II Tube type: Oral Tube size: 7.0 mm Number of attempts: 1 Airway Equipment and Method: Rigid stylet and Video-laryngoscopy Placement Confirmation: ETT inserted through vocal cords under direct vision, positive ETCO2, breath sounds checked- equal and bilateral and CO2 detector Secured at: 21 cm Tube secured with: Tape Dental Injury: Teeth and Oropharynx as per pre-operative assessment

## 2024-06-10 NOTE — Op Note (Signed)
 Operative Note  Pre-operative Diagnosis:  1. Primary hyperparathyroidism  2. Soft tissue mass right anterior chest wall  Post-operative Diagnosis:  same  Surgeon:  [name], MD  Assistant:  none   Procedure:  1. Left inferior parathyroidectomy  2. Excision soft tissue mass right anterior chest wall, 14 x 10 x 5 cm, submuscular  Anesthesia:  general  Estimated Blood Loss:  minimal  Drains: none         Specimen: parathyroid  and soft tissue mass to pathology  Indications:  Patient is referred by Dr. Shaune Delaine from nephrology for surgical evaluation and recommendations regarding hyperparathyroidism. Patient has had longstanding hypercalcemia. Calcium  levels have actually fluctuated significantly. Her most recent calcium  level is 9.1. It has been greater than 10 on multiple occasions. Patient has chronic kidney disease. Her recent intact PTH levels are markedly elevated at 760. This has been as high as 964. Patient has never been on hemodialysis. 25-hydroxy vitamin D level was slightly low at 25. Patient was sent for nuclear medicine parathyroid  scan performed in March 2025 which localized focal activity at the left lower position consistent with parathyroid  adenoma. Patient has also been managed on Sensipar and is currently taking 60 mg daily. Patient is accompanied today by her daughter. She lives at [facility] care center. Patient has had no prior head or neck surgery. There is a large soft tissue mass on the right anterior chest wall which extends near the base of the neck. This has been present for many years according to the patient and her daughter. Patient is now referred to discuss parathyroid  surgery.   Procedure:  The patient was seen in the pre-op holding area. The risks, benefits, complications, treatment options, and expected outcomes were previously discussed with the patient. The patient agreed with the proposed plan and has signed the informed consent form.  The patient  was brought to the operating room by the surgical team, identified as Maybelle Spatz and the procedure verified. A time out was completed and the above information confirmed.  Following induction of general anesthesia, the patient was positioned and then prepped and draped in the usual aseptic fashion.  After ascertaining that an adequate level of anesthesia been achieved, an incision was made over the soft tissue mass on the right anterior chest wall.  Dissection was carried down through subcutaneous tissues.  The pectoralis major muscle was attenuated over the underlying soft tissue mass.  Muscle was divided in line of its fibers and the mass was identified.  This was gently dissected out.  It represented a large lipoma.  Dissection was carried around the capsule and down to the chest wall.  Vascular structures were divided with the electrocautery.  Mass was completely excised off the underlying chest wall.  It measured 14 x 10 x 5 cm in size and was submitted in its entirety to pathology for review.  Good hemostasis was achieved throughout the operative field.  Subcutaneous tissues were closed with interrupted 3-0 Vicryl sutures.  Skin was closed with a running 4-0 Monocryl subcuticular suture.  Next we approached the neck.  A 3 cm incision is made in the left inferior position above the level of the clavicle.  Dissection is carried through subcutaneous tissues and platysma.  Skin flaps are developed circumferentially and a small retaining retractors placed for exposure.  Strap muscles are incised in the midline and reflected laterally exposing a normal left thyroid lobe.  Left thyroid lobe was gently mobilized using the harmonic scalpel.  On the inferior lateral portion of the left lobe there is an enlarged parathyroid  gland.  This is gently mobilized.  Vascular structures are divided between ligaclips.  The gland is excised.  Frozen section by pathology confirms hypercellular parathyroid  tissue  consistent with adenoma.  Good hemostasis is achieved throughout the operative field.  Fibrillar was placed throughout the operative field.  Strap muscles are reapproximated with interrupted 3-0 Vicryl sutures.  Platysma was closed with interrupted 3-0 Vicryl sutures.  Skin is anesthetized with local anesthetic.  Skin edges are reapproximated with a running 4-0 Monocryl subcuticular suture.  Both wounds are washed and dried.  Dermabond is applied to both incisions.  Patient is awakened from anesthesia and transferred to the recovery room in stable condition.  The patient tolerated the procedure well.   [name], MD [facility] Surgery Office: [phone]

## 2024-06-10 NOTE — OR Nursing (Signed)
 Lab called regarding patients CBC/BMP- both need a recollect. Patient already in OR. Called OR and let RN know.

## 2024-06-11 ENCOUNTER — Encounter (HOSPITAL_COMMUNITY): Payer: Self-pay | Admitting: Surgery

## 2024-06-11 DIAGNOSIS — E210 Primary hyperparathyroidism: Secondary | ICD-10-CM | POA: Diagnosis not present

## 2024-06-11 LAB — SURGICAL PATHOLOGY

## 2024-06-11 LAB — GLUCOSE, CAPILLARY
Glucose-Capillary: 116 mg/dL — ABNORMAL HIGH (ref 70–99)
Glucose-Capillary: 147 mg/dL — ABNORMAL HIGH (ref 70–99)

## 2024-06-11 NOTE — Progress Notes (Deleted)
 POST OPERATIVE OFFICE NOTE    CC:  F/u for surgery  HPI:  This is a 72 y.o. 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 Rosalva Comber.  She presents today at the request of her PCP with nonhealing left foot ulceration.    No Known Allergies  No current facility-administered medications for this visit.   No current outpatient medications on file.   Facility-Administered Medications Ordered in Other Visits  Medication Dose Route Frequency Provider Last Rate Last Admin   0.45 % sodium chloride  infusion   Intravenous Continuous [name], MD 50 mL/hr at 06/11/24 0627 New Bag at 06/11/24 8119   acetaminophen  (TYLENOL ) tablet 650 mg  650 mg Oral Q6H PRN [name], MD       Or   acetaminophen  (TYLENOL ) suppository 650 mg  650 mg Rectal Q6H PRN [name], MD       amLODipine  (NORVASC ) tablet 10 mg  10 mg Oral q morning [name], MD   10 mg at 06/10/24 1140   cloNIDine  (CATAPRES ) tablet 0.1 mg  0.1 mg Oral BID [name], MD   0.1 mg at 06/10/24 2238   furosemide  (LASIX ) tablet 40 mg  40 mg Oral BID [name], MD   40 mg at 06/11/24 0743   gabapentin (NEURONTIN) capsule 100 mg  100 mg Oral TID Gerkin, Todd, MD   100 mg at 06/10/24 2238   hydrALAZINE  (APRESOLINE ) tablet 25 mg  25 mg Oral TID Gerkin, Todd, MD   25 mg at 06/10/24 2238   HYDROmorphone  (DILAUDID ) injection 1 mg  1 mg Intravenous Q2H PRN [name], MD       insulin  aspart (novoLOG ) injection 0-15 Units  0-15 Units Subcutaneous TID WC [name], MD   2 Units at 06/10/24 1718   labetalol  (NORMODYNE ) tablet 100 mg  100 mg Oral BID [name], MD   100 mg at 06/10/24 2238   linagliptin (TRADJENTA) tablet 5 mg  5 mg Oral Daily Gerkin, Todd, MD   5 mg at 06/10/24 1140   ondansetron  (ZOFRAN -ODT) disintegrating tablet 4 mg  4 mg Oral Q6H PRN [name], MD       Or   ondansetron  (ZOFRAN ) injection 4 mg  4 mg Intravenous Q6H PRN [name], MD       oxyCODONE  (Oxy IR/ROXICODONE ) immediate release tablet 5-10 mg   5-10 mg Oral Q4H PRN [name], MD       potassium chloride  (KLOR-CON  M) CR tablet 10 mEq  10 mEq Oral Daily [name], MD   10 mEq at 06/10/24 1140   senna (SENOKOT) tablet 8.6 mg  1 tablet Oral Daily [name], MD   8.6 mg at 06/10/24 1140   sertraline  (ZOLOFT ) tablet 12.5 mg  12.5 mg Oral Daily Gerkin, Todd, MD   12.5 mg at 06/10/24 1153   traMADol  (ULTRAM ) tablet 50 mg  50 mg Oral Q6H PRN [name], MD         ROS:  See HPI  Physical Exam:  There were no vitals filed for this visit.   General: very pleasant, in no discomfort Incision:  right AKA well healed, remaining staples removed Extremities:  well perfused and warm. Left leg is edematous. She does have chronic venous stasis changes to her LLE Neuro: alert and oriented   Assessment/Plan:  This is a 73 y.o. female who is s/p: right AKA on 10/31/22 by Dr. Rosalva Comber. The remainder of her staples were removed today. Her AKA  is healing very well. She does not have any pain. She does not have any interest in a prosthesis but did request a stump stocking so she was provided with Rx for this. She will follow up with us  as needed if she has any new or concerning symptoms.   [name], [facility] [phone]   Clinic MD:  Vikki Graves

## 2024-06-11 NOTE — Discharge Summary (Signed)
    Physician Discharge Summary   Patient ID: Angela Hays MRN: [id number] DOB/AGE: [birth year] 73 y.o.  Admit date: 06/10/2024  Discharge date: 06/11/2024  Discharge Diagnoses:  Principal Problem:   Hyperparathyroidism, primary [facility]) Active Problems:   Mass of soft tissue of neck   Primary hyperparathyroidism [facility])   Discharged Condition: good  Hospital Course: Patient was admitted for observation following parathyroidectomy and excision of soft tissue mass from chest wall.  Post op course was uncomplicated.  Pain was well controlled.  Tolerated diet.  Post op calcium  level on morning following surgery was 9.9 mg/dl.  Patient was prepared for discharge home on POD#1.  Consults: None  Treatments: surgery: left inferior parathyroidectomy and excision of soft tissue mass from chest wall.  Discharge Exam: Blood pressure (!) 169/72, pulse 72, temperature 98.1 F (36.7 C), temperature source Oral, resp. rate 18, height 5' 3 (1.6 m), weight 72 kg, SpO2 92%. HEENT - clear Neck - wound dry and intact; voice normal Chest - would dry and intact; mild STS  Disposition: Home  Discharge Instructions     Diet - low sodium heart healthy   Complete by: As directed    Ice pack   Complete by: As directed    Increase activity slowly   Complete by: As directed    No dressing needed   Complete by: As directed          Follow-up Information     [name], MD. Schedule an appointment as soon as possible for a visit in 3 week(s).   Specialty: General Surgery Why: For wound re-check Contact information: [address] [place] [address] [phone]                 [name], MD [facility] Surgery Office: [phone]   Signed: [name] 06/11/2024, 11:10 AM

## 2024-06-11 NOTE — Progress Notes (Signed)
 Report called to RN at [facility]. All questions have been answered. Pt awaits transportation to pick her up.

## 2024-06-11 NOTE — Discharge Instructions (Signed)

## 2024-06-11 NOTE — Plan of Care (Signed)

## 2024-06-12 ENCOUNTER — Telehealth: Payer: Self-pay

## 2024-06-12 ENCOUNTER — Other Ambulatory Visit: Payer: Self-pay | Admitting: Vascular Surgery

## 2024-06-12 DIAGNOSIS — L97529 Non-pressure chronic ulcer of other part of left foot with unspecified severity: Secondary | ICD-10-CM

## 2024-06-12 LAB — POCT I-STAT EG7
Acid-base deficit: 1 mmol/L (ref 0.0–2.0)
Bicarbonate: 25.4 mmol/L (ref 20.0–28.0)
Calcium, Ion: 1.36 mmol/L (ref 1.15–1.40)
HCT: 38 % (ref 36.0–46.0)
Hemoglobin: 12.9 g/dL (ref 12.0–15.0)
O2 Saturation: 68 %
Potassium: 4.5 mmol/L (ref 3.5–5.1)
Sodium: 142 mmol/L (ref 135–145)
TCO2: 27 mmol/L (ref 22–32)
pCO2, Ven: 50.6 mmHg (ref 44–60)
pH, Ven: 7.309 (ref 7.25–7.43)
pO2, Ven: 39 mmHg (ref 32–45)

## 2024-06-12 NOTE — Telephone Encounter (Addendum)
 Called pt's SNF to reschedule pt's appts. The ABIs performed by the SNF were not sufficient to make sound medical decisions and needed to be repeated.  Appts rescheduled for 06/13/24 @1400  (ABI) and 1520 (Dr. Rosalva Comber) Scheduler agreed transportation for pt will be provided and pt will keep her new appts.  Daughter, Wallene Gum, notified.  Reported she will accompany her mom to her appts.

## 2024-06-13 ENCOUNTER — Encounter: Payer: Self-pay | Admitting: Vascular Surgery

## 2024-06-13 ENCOUNTER — Ambulatory Visit: Admitting: Vascular Surgery

## 2024-06-13 ENCOUNTER — Ambulatory Visit (HOSPITAL_COMMUNITY)
Admission: RE | Admit: 2024-06-13 | Discharge: 2024-06-13 | Disposition: A | Discharge status: Home / Self Care | Source: Ambulatory Visit | Attending: Vascular Surgery | Admitting: Vascular Surgery

## 2024-06-13 ENCOUNTER — Other Ambulatory Visit: Payer: Self-pay

## 2024-06-13 VITALS — BP 182/81 | HR 66 | Temp 97.7°F | Resp 20 | Ht 63.0 in | Wt 158.0 lb

## 2024-06-13 DIAGNOSIS — L97529 Non-pressure chronic ulcer of other part of left foot with unspecified severity: Secondary | ICD-10-CM

## 2024-06-13 LAB — VAS US ABI WITH/WO TBI

## 2024-06-13 NOTE — Progress Notes (Signed)
 Patient is a 73 year old female presents today in the office with left-sided great toe wound, with significant pain. 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  On exam today, Angela Hays was accompanied by her daughter.  She continues to live in assisted living.  There, she is in a wheelchair for the majority of the day and is moved using a [employer] lift.  She does not ambulate, nor if she able to stand and pivot.  The pain in the left foot is keeping her up at night.   No Known Allergies  Current Outpatient Medications  Medication Sig Dispense Refill   amLODipine  (NORVASC ) 10 MG tablet Take 1 tablet (10 mg total) by mouth every morning.     cholecalciferol (VITAMIN D3) 25 MCG (1000 UNIT) tablet Take 1,000 Units by mouth daily.     cloNIDine  (CATAPRES ) 0.1 MG tablet Take 1 tablet (0.1 mg total) by mouth 2 (two) times daily.     furosemide  (LASIX ) 40 MG tablet Take 40 mg by mouth 2 (two) times daily.     gabapentin (NEURONTIN) 100 MG capsule Take 100 mg by mouth 3 (three) times daily.     hydrALAZINE  (APRESOLINE ) 25 MG tablet Take 1 tablet (25 mg total) by mouth 3 (three) times daily. 90 tablet 0   Insulin  Lispro-aabc (LYUMJEV KWIKPEN) 100 UNIT/ML KwikPen Inject 0-14 Units into the skin See admin instructions. Subcutaneously before meals and at bedtime CBG 70 - 120: 0 units CBG 121 - 150: 3 units CBG 151 - 200: 4 units CBG 201 - 250: 6 units CBG 251 - 300: 8 units CBG 301 - 350: 10 units CBG 351 - 400: 12 units CBG 401 - 999: 14 units Call MD for BS>400,     labetalol  (NORMODYNE ) 100 MG tablet Take 100 mg by mouth 2 (two) times daily.  0   LANTUS  SOLOSTAR 100 UNIT/ML Solostar Pen Inject 12-15 Units into the skin See admin instructions. Inject 15 units in the AM and 12 units in the PM. Notify MD/ZNP if BS less than 70 or greater than 400.     potassium chloride  (MICRO-K ) 10 MEQ CR capsule Take 20 mEq by mouth daily.      senna (SENOKOT) 8.6 MG TABS tablet Take 2 tablets by mouth in the morning and at bedtime.     sertraline  (ZOLOFT ) 25 MG tablet Take 12.5 mg by mouth daily.     simvastatin  (ZOCOR ) 20 MG tablet Take 20 mg by mouth at bedtime.     sitaGLIPtin  (JANUVIA ) 50 MG tablet Take 50 mg by mouth daily.     clopidogrel  (PLAVIX ) 75 MG tablet Take 75 mg by mouth daily. (Patient not taking: Reported on 06/13/2024)     No current facility-administered medications for this visit.     ROS:  See HPI  Physical Exam:  Vitals:   06/13/24 1418  BP: (!) 182/81  Pulse: 66  Resp: 20  Temp: 97.7 F (36.5 C)  SpO2: 95%     General: very pleasant Incision:  right AKA well healed Extremities: Left lower extremity with significant lymphedema.  Left great toe wound.    Assessment/Plan:  This is a 73 y.o. female who is s/p: right AKA on 10/31/22.  Patient is not ambulatory, unable to stand and pivot.  Does not use the left lower extremity at all, and requires a [employer] lift.  I had a long discussion with Angela Hays and her sister regarding the above.  I offered her continued wound care versus primary above-knee amputation.  After discussing the risks and benefits of both, Angela Hays elected to proceed with primary above-knee amputation.

## 2024-06-14 NOTE — Progress Notes (Addendum)
 Fax instructions over to [facility] [phone]   Please send day of procedure:        Current med list  Medications taken the day of procedure  Confirm time of nothing by mouth status  Patient Demographic info (to include DNR status, problem list, allergies)   Plavix  instructions:  YOU WILL NEED TO HOLD YOUR PLAVIX  FOR 4 DAYS BEFORE YOUR PROCEDURE. TAKE YOUR LAST DOSE ON 06/12/24.  Anesthesia review: Y  Patient verbally denies any shortness of breath, fever, cough and chest pain during phone call   -------------  SDW INSTRUCTIONS given:  Your procedure is scheduled on Monday, June 23rd.  Report to [facility] Main Entrance A at 0530 A.M., and check in at the Admitting office.  Call this number if you have problems the morning of surgery:  [phone]   Remember:  Do not eat or drink after midnight the night before your surgery    Take these medicines the morning of surgery with A SIP OF WATER   amLODipine  (NORVASC )  cloNIDine  (CATAPRES )  gabapentin (NEURONTIN)  hydrALAZINE  (APRESOLINE )  labetalol  (NORMODYNE )  sertraline  (ZOLOFT )   Insulin  Lispro-aabc (LYUMJEV KWIKPEN) : **NO BED TIME DOSE ON 06/16/24** **Monday Morning ONLY take if blood sugar is above 220**  LANTUS  SOLOSTAR:  Dinner/Bedtime dose on 6/22 ONLY take 50% of normal dose. Monday Morning 6/23 before surgery ONLY take 50% of normal dose.   ** PLEASE check your blood sugar the morning of your surgery when you wake up and every 2 hours until you get to the Short Stay unit.  If your blood sugar is less than 70 mg/dL, you will need to treat for low blood sugar: Do not take insulin . Treat a low blood sugar (less than 70 mg/dL) with  cup of clear juice (cranberry or apple), 4 glucose tablets, OR glucose gel. Recheck blood sugar in 15 minutes after treatment (to make sure it is greater than 70 mg/dL). If your blood sugar is not greater than 70 mg/dL on recheck, call [phone] for  further instructions.   As of today, STOP taking any Aspirin (unless otherwise instructed by your surgeon) Aleve, Naproxen, Ibuprofen, Motrin, Advil, Goody's, BC's, all herbal medications, fish oil, and all vitamins.                      Do not wear jewelry, make up, or nail polish            Do not wear lotions, powders, perfumes/colognes, or deodorant.            Do not shave 48 hours prior to surgery.  Men may shave face and neck.            Do not bring valuables to the hospital.            [facility] is not responsible for any belongings or valuables.  Do NOT Smoke (Tobacco/Vaping) 24 hours prior to your procedure If you use a CPAP at night, you may bring all equipment for your overnight stay.   Contacts, glasses, dentures or bridgework may not be worn into surgery.      For patients admitted to the hospital, discharge time will be determined by your treatment team.   Patients discharged the day of surgery will not be allowed to drive home, and someone needs to stay with them for 24 hours.    Special instructions:   [place]- Preparing For Surgery  Before surgery, you  can play an important role. Because skin is not sterile, your skin needs to be as free of germs as possible. You can reduce the number of germs on your skin by washing with CHG (chlorahexidine gluconate) Soap before surgery.  CHG is an antiseptic cleaner which kills germs and bonds with the skin to continue killing germs even after washing.    Oral Hygiene is also important to reduce your risk of infection.  Remember - BRUSH YOUR TEETH THE MORNING OF SURGERY WITH YOUR REGULAR TOOTHPASTE  Please do not use if you have an allergy to CHG or antibacterial soaps. If your skin becomes reddened/irritated stop using the CHG.  Do not shave (including legs and underarms) for at least 48 hours prior to first CHG shower. It is OK to shave your face.  Please follow these instructions carefully.   Shower the NIGHT BEFORE  SURGERY and the MORNING OF SURGERY with DIAL Soap.   Pat yourself dry with a CLEAN TOWEL.  Wear CLEAN PAJAMAS to bed the night before surgery  Place CLEAN SHEETS on your bed the night of your first shower and DO NOT SLEEP WITH PETS.   Day of Surgery: Please shower morning of surgery  Wear Clean/Comfortable clothing the morning of surgery Do not apply any deodorants/lotions.   Remember to brush your teeth WITH YOUR REGULAR TOOTHPASTE.   Questions were answered. Patient verbalized understanding of instructions.

## 2024-06-17 ENCOUNTER — Other Ambulatory Visit: Payer: Self-pay

## 2024-06-17 ENCOUNTER — Inpatient Hospital Stay (HOSPITAL_COMMUNITY): Payer: Self-pay | Admitting: Physician Assistant

## 2024-06-17 ENCOUNTER — Encounter (HOSPITAL_COMMUNITY): Payer: Self-pay | Admitting: Vascular Surgery

## 2024-06-17 ENCOUNTER — Inpatient Hospital Stay (HOSPITAL_COMMUNITY)
Admission: RE | Admit: 2024-06-17 | Discharge: 2024-06-21 | DRG: 240 | Disposition: A | Discharge status: Skilled Nursing Facility | Attending: Family Medicine | Admitting: Family Medicine

## 2024-06-17 ENCOUNTER — Encounter (HOSPITAL_COMMUNITY)
Admission: RE | Disposition: A | Discharge status: Skilled Nursing Facility | Payer: Self-pay | Source: Home / Self Care | Attending: Family Medicine

## 2024-06-17 DIAGNOSIS — I70245 Atherosclerosis of native arteries of left leg with ulceration of other part of foot: Secondary | ICD-10-CM

## 2024-06-17 DIAGNOSIS — F419 Anxiety disorder, unspecified: Secondary | ICD-10-CM | POA: Diagnosis present

## 2024-06-17 DIAGNOSIS — M109 Gout, unspecified: Secondary | ICD-10-CM | POA: Diagnosis present

## 2024-06-17 DIAGNOSIS — Z79899 Other long term (current) drug therapy: Secondary | ICD-10-CM

## 2024-06-17 DIAGNOSIS — N184 Chronic kidney disease, stage 4 (severe): Secondary | ICD-10-CM | POA: Diagnosis present

## 2024-06-17 DIAGNOSIS — E11621 Type 2 diabetes mellitus with foot ulcer: Secondary | ICD-10-CM | POA: Diagnosis present

## 2024-06-17 DIAGNOSIS — F32A Depression, unspecified: Secondary | ICD-10-CM | POA: Diagnosis present

## 2024-06-17 DIAGNOSIS — I130 Hypertensive heart and chronic kidney disease with heart failure and stage 1 through stage 4 chronic kidney disease, or unspecified chronic kidney disease: Secondary | ICD-10-CM

## 2024-06-17 DIAGNOSIS — I70222 Atherosclerosis of native arteries of extremities with rest pain, left leg: Secondary | ICD-10-CM | POA: Diagnosis present

## 2024-06-17 DIAGNOSIS — Z87891 Personal history of nicotine dependence: Secondary | ICD-10-CM | POA: Diagnosis not present

## 2024-06-17 DIAGNOSIS — Z89611 Acquired absence of right leg above knee: Secondary | ICD-10-CM | POA: Diagnosis not present

## 2024-06-17 DIAGNOSIS — Z7984 Long term (current) use of oral hypoglycemic drugs: Secondary | ICD-10-CM | POA: Diagnosis not present

## 2024-06-17 DIAGNOSIS — N183 Chronic kidney disease, stage 3 unspecified: Secondary | ICD-10-CM

## 2024-06-17 DIAGNOSIS — E785 Hyperlipidemia, unspecified: Secondary | ICD-10-CM | POA: Diagnosis present

## 2024-06-17 DIAGNOSIS — E1151 Type 2 diabetes mellitus with diabetic peripheral angiopathy without gangrene: Principal | ICD-10-CM | POA: Diagnosis present

## 2024-06-17 DIAGNOSIS — E1142 Type 2 diabetes mellitus with diabetic polyneuropathy: Secondary | ICD-10-CM | POA: Diagnosis present

## 2024-06-17 DIAGNOSIS — I69354 Hemiplegia and hemiparesis following cerebral infarction affecting left non-dominant side: Secondary | ICD-10-CM

## 2024-06-17 DIAGNOSIS — Z89612 Acquired absence of left leg above knee: Secondary | ICD-10-CM | POA: Diagnosis not present

## 2024-06-17 DIAGNOSIS — M24552 Contracture, left hip: Secondary | ICD-10-CM | POA: Diagnosis present

## 2024-06-17 DIAGNOSIS — Z89619 Acquired absence of unspecified leg above knee: Principal | ICD-10-CM

## 2024-06-17 DIAGNOSIS — I5032 Chronic diastolic (congestive) heart failure: Secondary | ICD-10-CM | POA: Diagnosis present

## 2024-06-17 DIAGNOSIS — L97529 Non-pressure chronic ulcer of other part of left foot with unspecified severity: Secondary | ICD-10-CM | POA: Diagnosis present

## 2024-06-17 DIAGNOSIS — E1122 Type 2 diabetes mellitus with diabetic chronic kidney disease: Secondary | ICD-10-CM | POA: Diagnosis present

## 2024-06-17 DIAGNOSIS — Z794 Long term (current) use of insulin: Secondary | ICD-10-CM

## 2024-06-17 DIAGNOSIS — Z7409 Other reduced mobility: Secondary | ICD-10-CM

## 2024-06-17 HISTORY — PX: AMPUTATION: SHX166

## 2024-06-17 LAB — CBC
HCT: 40 % (ref 36.0–46.0)
Hemoglobin: 12.1 g/dL (ref 12.0–15.0)
MCH: 28.3 pg (ref 26.0–34.0)
MCHC: 30.3 g/dL (ref 30.0–36.0)
MCV: 93.7 fL (ref 80.0–100.0)
Platelets: 280 10*3/uL (ref 150–400)
RBC: 4.27 MIL/uL (ref 3.87–5.11)
RDW: 15 % (ref 11.5–15.5)
WBC: 9.5 10*3/uL (ref 4.0–10.5)
nRBC: 0 % (ref 0.0–0.2)

## 2024-06-17 LAB — COMPREHENSIVE METABOLIC PANEL WITH GFR
ALT: 8 U/L (ref 0–44)
AST: 14 U/L — ABNORMAL LOW (ref 15–41)
Albumin: 3.6 g/dL (ref 3.5–5.0)
Alkaline Phosphatase: 121 U/L (ref 38–126)
Anion gap: 19 — ABNORMAL HIGH (ref 5–15)
BUN: 57 mg/dL — ABNORMAL HIGH (ref 8–23)
CO2: 21 mmol/L — ABNORMAL LOW (ref 22–32)
Calcium: 10.4 mg/dL — ABNORMAL HIGH (ref 8.9–10.3)
Chloride: 105 mmol/L (ref 98–111)
Creatinine, Ser: 2.24 mg/dL — ABNORMAL HIGH (ref 0.44–1.00)
GFR, Estimated: 22 mL/min — ABNORMAL LOW (ref >60)
Glucose, Bld: 107 mg/dL — ABNORMAL HIGH (ref 70–99)
Potassium: 4.4 mmol/L (ref 3.5–5.1)
Sodium: 145 mmol/L (ref 135–145)
Total Bilirubin: 0.8 mg/dL (ref 0.0–1.2)
Total Protein: 7 g/dL (ref 6.5–8.1)

## 2024-06-17 LAB — POCT I-STAT EG7
Acid-Base Excess: 2 mmol/L (ref 0.0–2.0)
Bicarbonate: 29.1 mmol/L — ABNORMAL HIGH (ref 20.0–28.0)
Calcium, Ion: 1.19 mmol/L (ref 1.15–1.40)
HCT: 39 % (ref 36.0–46.0)
Hemoglobin: 13.3 g/dL (ref 12.0–15.0)
O2 Saturation: 66 %
Potassium: >8.5 mmol/L (ref 3.5–5.1)
Sodium: 137 mmol/L (ref 135–145)
TCO2: 31 mmol/L (ref 22–32)
pCO2, Ven: 52.3 mmHg (ref 44–60)
pH, Ven: 7.353 (ref 7.25–7.43)
pO2, Ven: 37 mmHg (ref 32–45)

## 2024-06-17 LAB — HEMOGLOBIN A1C
Hgb A1c MFr Bld: 5.9 % — ABNORMAL HIGH (ref 4.8–5.6)
Mean Plasma Glucose: 122.63 mg/dL

## 2024-06-17 LAB — ABO/RH: ABO/RH(D): B POS

## 2024-06-17 LAB — GLUCOSE, CAPILLARY
Glucose-Capillary: 113 mg/dL — ABNORMAL HIGH (ref 70–99)
Glucose-Capillary: 107 mg/dL — ABNORMAL HIGH (ref 70–99)
Glucose-Capillary: 167 mg/dL — ABNORMAL HIGH (ref 70–99)
Glucose-Capillary: 241 mg/dL — ABNORMAL HIGH (ref 70–99)
Glucose-Capillary: 179 mg/dL — ABNORMAL HIGH (ref 70–99)

## 2024-06-17 LAB — APTT: aPTT: 43 s — ABNORMAL HIGH (ref 24–36)

## 2024-06-17 LAB — TYPE AND SCREEN
ABO/RH(D): B POS
Antibody Screen: NEGATIVE

## 2024-06-17 LAB — PROTIME-INR
INR: 1.1 (ref 0.8–1.2)
Prothrombin Time: 14.4 s (ref 11.4–15.2)

## 2024-06-17 SURGERY — AMPUTATION, ABOVE KNEE
Anesthesia: General | Site: Knee | Laterality: Left

## 2024-06-17 MED ORDER — CHLORHEXIDINE GLUCONATE CLOTH 2 % EX PADS: 6.0000 | MEDICATED_PAD | Freq: Once | CUTANEOUS | Status: DC

## 2024-06-17 MED ORDER — 0.9 % SODIUM CHLORIDE (POUR BTL) OPTIME
TOPICAL | Status: DC | PRN
  Administered 2024-06-17: 1000 mL

## 2024-06-17 MED ORDER — PHENYLEPHRINE 80 MCG/ML (10ML) SYRINGE FOR IV PUSH (FOR BLOOD PRESSURE SUPPORT)
PREFILLED_SYRINGE | INTRAVENOUS | Status: AC
  Filled 2024-06-17: qty 10

## 2024-06-17 MED ORDER — FENTANYL CITRATE (PF) 100 MCG/2ML IJ SOLN
INTRAMUSCULAR | Status: AC
Start: 2024-06-17 — End: 2024-06-17
  Filled 2024-06-17: qty 2

## 2024-06-17 MED ORDER — FENTANYL CITRATE (PF) 100 MCG/2ML IJ SOLN
25.0000 ug | INTRAMUSCULAR | Status: DC | PRN
  Administered 2024-06-17: 50 ug via INTRAVENOUS
  Administered 2024-06-17 (×2): 25 ug via INTRAVENOUS

## 2024-06-17 MED ORDER — OXYCODONE HCL 5 MG PO TABS: 5.0000 mg | ORAL_TABLET | Freq: Once | ORAL | Status: AC | PRN

## 2024-06-17 MED ORDER — OXYCODONE HCL 5 MG/5ML PO SOLN: 5.0000 mg | Freq: Once | ORAL | Status: DC | PRN

## 2024-06-17 MED ORDER — EPHEDRINE SULFATE-NACL 50-0.9 MG/10ML-% IV SOSY
PREFILLED_SYRINGE | INTRAVENOUS | Status: DC | PRN
  Administered 2024-06-17: 5 mg via INTRAVENOUS

## 2024-06-17 MED ORDER — GABAPENTIN 100 MG PO CAPS
100.0000 mg | ORAL_CAPSULE | Freq: Three times a day (TID) | ORAL | Status: DC
  Administered 2024-06-17 – 2024-06-21 (×14): 100 mg via ORAL
  Filled 2024-06-17 (×14): qty 1

## 2024-06-17 MED ORDER — OXYCODONE HCL 5 MG/5ML PO SOLN
ORAL | Status: AC
  Filled 2024-06-17: qty 5

## 2024-06-17 MED ORDER — LIDOCAINE 2% (20 MG/ML) 5 ML SYRINGE
INTRAMUSCULAR | Status: AC
  Filled 2024-06-17: qty 5

## 2024-06-17 MED ORDER — CHLORHEXIDINE GLUCONATE 0.12 % MT SOLN
15.0000 mL | Freq: Once | OROMUCOSAL | Status: AC
  Administered 2024-06-17: 15 mL via OROMUCOSAL
  Filled 2024-06-17: qty 15

## 2024-06-17 MED ORDER — ENOXAPARIN SODIUM 40 MG/0.4ML IJ SOSY
40.0000 mg | PREFILLED_SYRINGE | INTRAMUSCULAR | Status: DC
  Filled 2024-06-17: qty 0.4

## 2024-06-17 MED ORDER — LABETALOL HCL 100 MG PO TABS
100.0000 mg | ORAL_TABLET | Freq: Two times a day (BID) | ORAL | Status: DC
  Administered 2024-06-17 – 2024-06-21 (×9): 100 mg via ORAL
  Filled 2024-06-17 (×9): qty 1

## 2024-06-17 MED ORDER — SODIUM CHLORIDE 0.9 % IV SOLN: INTRAVENOUS | Status: DC

## 2024-06-17 MED ORDER — FUROSEMIDE 40 MG PO TABS: 40.0000 mg | ORAL_TABLET | Freq: Two times a day (BID) | ORAL | Status: DC

## 2024-06-17 MED ORDER — VITAMIN D 25 MCG (1000 UNIT) PO TABS
1000.0000 [IU] | ORAL_TABLET | Freq: Every day | ORAL | Status: DC
  Administered 2024-06-17 – 2024-06-21 (×5): 1000 [IU] via ORAL
  Filled 2024-06-17 (×5): qty 1

## 2024-06-17 MED ORDER — ENOXAPARIN SODIUM 30 MG/0.3ML IJ SOSY
30.0000 mg | PREFILLED_SYRINGE | INTRAMUSCULAR | Status: DC
  Administered 2024-06-18 – 2024-06-21 (×4): 30 mg via SUBCUTANEOUS
  Filled 2024-06-17 (×4): qty 0.3

## 2024-06-17 MED ORDER — LACTATED RINGERS IV SOLN: INTRAVENOUS | Status: DC

## 2024-06-17 MED ORDER — EPHEDRINE 5 MG/ML INJ
INTRAVENOUS | Status: AC
  Filled 2024-06-17: qty 5

## 2024-06-17 MED ORDER — ORAL CARE MOUTH RINSE: 15.0000 mL | Freq: Once | OROMUCOSAL | Status: AC

## 2024-06-17 MED ORDER — DEXAMETHASONE SODIUM PHOSPHATE 10 MG/ML IJ SOLN
INTRAMUSCULAR | Status: DC | PRN
  Administered 2024-06-17: 4 mg via INTRAVENOUS

## 2024-06-17 MED ORDER — FENTANYL CITRATE (PF) 250 MCG/5ML IJ SOLN
INTRAMUSCULAR | Status: DC | PRN
  Administered 2024-06-17: 100 ug via INTRAVENOUS

## 2024-06-17 MED ORDER — OXYCODONE HCL 5 MG PO TABS: 5.0000 mg | ORAL_TABLET | ORAL | Status: DC | PRN

## 2024-06-17 MED ORDER — ONDANSETRON HCL 4 MG/2ML IJ SOLN: 4.0000 mg | Freq: Four times a day (QID) | INTRAMUSCULAR | Status: DC | PRN

## 2024-06-17 MED ORDER — SIMVASTATIN 20 MG PO TABS
20.0000 mg | ORAL_TABLET | Freq: Every day | ORAL | Status: DC
  Administered 2024-06-17 – 2024-06-21 (×5): 20 mg via ORAL
  Filled 2024-06-17 (×5): qty 1

## 2024-06-17 MED ORDER — ROCURONIUM BROMIDE 10 MG/ML (PF) SYRINGE
PREFILLED_SYRINGE | INTRAVENOUS | Status: AC
Start: 2024-06-17 — End: 2024-06-17
  Filled 2024-06-17: qty 10

## 2024-06-17 MED ORDER — AMLODIPINE BESYLATE 10 MG PO TABS
10.0000 mg | ORAL_TABLET | Freq: Every morning | ORAL | Status: DC
  Administered 2024-06-18 – 2024-06-21 (×4): 10 mg via ORAL
  Filled 2024-06-17 (×4): qty 1

## 2024-06-17 MED ORDER — PHENYLEPHRINE HCL-NACL 20-0.9 MG/250ML-% IV SOLN
INTRAVENOUS | Status: DC | PRN
  Administered 2024-06-17: 35 ug/min via INTRAVENOUS

## 2024-06-17 MED ORDER — ONDANSETRON HCL 4 MG/2ML IJ SOLN
INTRAMUSCULAR | Status: DC | PRN
  Administered 2024-06-17: 4 mg via INTRAVENOUS

## 2024-06-17 MED ORDER — INSULIN ASPART 100 UNIT/ML IJ SOLN: 0.0000 [IU] | INTRAMUSCULAR | Status: DC | PRN

## 2024-06-17 MED ORDER — SUGAMMADEX SODIUM 200 MG/2ML IV SOLN
INTRAVENOUS | Status: DC | PRN
  Administered 2024-06-17: 150 mg via INTRAVENOUS

## 2024-06-17 MED ORDER — LIDOCAINE HCL (PF) 2 % IJ SOLN
INTRAMUSCULAR | Status: DC | PRN
  Administered 2024-06-17: 60 mg via INTRADERMAL

## 2024-06-17 MED ORDER — FUROSEMIDE 40 MG PO TABS
40.0000 mg | ORAL_TABLET | Freq: Two times a day (BID) | ORAL | Status: DC
  Administered 2024-06-17 – 2024-06-21 (×9): 40 mg via ORAL
  Filled 2024-06-17 (×9): qty 1

## 2024-06-17 MED ORDER — OXYCODONE HCL 5 MG/5ML PO SOLN
5.0000 mg | Freq: Once | ORAL | Status: AC | PRN
  Administered 2024-06-17: 5 mg via ORAL

## 2024-06-17 MED ORDER — SERTRALINE HCL 25 MG PO TABS
12.5000 mg | ORAL_TABLET | Freq: Every day | ORAL | Status: DC
  Administered 2024-06-17 – 2024-06-21 (×5): 12.5 mg via ORAL
  Filled 2024-06-17 (×5): qty 0.5

## 2024-06-17 MED ORDER — ONDANSETRON HCL 4 MG/2ML IJ SOLN
INTRAMUSCULAR | Status: AC
Start: 2024-06-17 — End: 2024-06-17
  Filled 2024-06-17: qty 2

## 2024-06-17 MED ORDER — CLONIDINE HCL 0.1 MG PO TABS: 0.1000 mg | ORAL_TABLET | Freq: Two times a day (BID) | ORAL | Status: DC

## 2024-06-17 MED ORDER — CEFAZOLIN SODIUM-DEXTROSE 2-4 GM/100ML-% IV SOLN
2.0000 g | INTRAVENOUS | Status: AC
  Administered 2024-06-17: 2 g via INTRAVENOUS
  Filled 2024-06-17: qty 100

## 2024-06-17 MED ORDER — DEXAMETHASONE SODIUM PHOSPHATE 10 MG/ML IJ SOLN
INTRAMUSCULAR | Status: AC
  Filled 2024-06-17: qty 1

## 2024-06-17 MED ORDER — PROPOFOL 10 MG/ML IV BOLUS
INTRAVENOUS | Status: AC
  Filled 2024-06-17: qty 20

## 2024-06-17 MED ORDER — LABETALOL HCL 100 MG PO TABS
100.0000 mg | ORAL_TABLET | Freq: Once | ORAL | Status: AC
  Administered 2024-06-17: 100 mg via ORAL
  Filled 2024-06-17: qty 1

## 2024-06-17 MED ORDER — PROPOFOL 10 MG/ML IV BOLUS
INTRAVENOUS | Status: DC | PRN
Start: 2024-06-17 — End: 2024-06-17
  Administered 2024-06-17: 90 mg via INTRAVENOUS

## 2024-06-17 MED ORDER — ACETAMINOPHEN 500 MG PO TABS
1000.0000 mg | ORAL_TABLET | Freq: Three times a day (TID) | ORAL | Status: DC
  Administered 2024-06-17 – 2024-06-21 (×6): 1000 mg via ORAL
  Filled 2024-06-17 (×9): qty 2

## 2024-06-17 MED ORDER — ROCURONIUM BROMIDE 10 MG/ML (PF) SYRINGE
PREFILLED_SYRINGE | INTRAVENOUS | Status: DC | PRN
  Administered 2024-06-17: 50 mg via INTRAVENOUS

## 2024-06-17 MED ORDER — ROCURONIUM BROMIDE 10 MG/ML (PF) SYRINGE
PREFILLED_SYRINGE | INTRAVENOUS | Status: AC
  Filled 2024-06-17: qty 10

## 2024-06-17 MED ORDER — FENTANYL CITRATE (PF) 250 MCG/5ML IJ SOLN
INTRAMUSCULAR | Status: AC
  Filled 2024-06-17: qty 5

## 2024-06-17 MED ORDER — INSULIN GLARGINE-YFGN 100 UNIT/ML ~~LOC~~ SOLN
15.0000 [IU] | Freq: Every day | SUBCUTANEOUS | Status: DC
  Administered 2024-06-17 – 2024-06-21 (×5): 15 [IU] via SUBCUTANEOUS
  Filled 2024-06-17 (×6): qty 0.15

## 2024-06-17 MED ORDER — INSULIN ASPART 100 UNIT/ML IJ SOLN
0.0000 [IU] | Freq: Three times a day (TID) | INTRAMUSCULAR | Status: DC
  Administered 2024-06-18 (×3): 2 [IU] via SUBCUTANEOUS
  Administered 2024-06-19: 5 [IU] via SUBCUTANEOUS
  Administered 2024-06-19 – 2024-06-20 (×2): 2 [IU] via SUBCUTANEOUS
  Administered 2024-06-20: 5 [IU] via SUBCUTANEOUS
  Administered 2024-06-20: 3 [IU] via SUBCUTANEOUS
  Administered 2024-06-21: 5 [IU] via SUBCUTANEOUS
  Administered 2024-06-21: 2 [IU] via SUBCUTANEOUS

## 2024-06-17 SURGICAL SUPPLY — 47 items
BAG COUNTER SPONGE SURGICOUNT (BAG) ×1 IMPLANT
BANDAGE ESMARK 6X9 LF (GAUZE/BANDAGES/DRESSINGS) ×1 IMPLANT
BLADE SAW SGTL 73X25 THK (BLADE) ×1 IMPLANT
BNDG COHESIVE 6X5 TAN ST LF (GAUZE/BANDAGES/DRESSINGS) ×1 IMPLANT
BNDG ELASTIC 4X5.8 VLCR STR LF (GAUZE/BANDAGES/DRESSINGS) ×1 IMPLANT
BNDG ELASTIC 6INX 5YD STR LF (GAUZE/BANDAGES/DRESSINGS) ×1 IMPLANT
BNDG GAUZE DERMACEA FLUFF 4 (GAUZE/BANDAGES/DRESSINGS) ×2 IMPLANT
BUR DISC 0.8X25 (BURR) IMPLANT
CANISTER SUCTION 3000ML PPV (SUCTIONS) ×1 IMPLANT
CLIP TI LARGE 6 (CLIP) IMPLANT
CLIP TI MEDIUM 6 (CLIP) ×1 IMPLANT
CLIP TI WIDE RED SMALL 6 (CLIP) ×1 IMPLANT
COVER BACK TABLE 60X90IN (DRAPES) ×1 IMPLANT
COVER SURGICAL LIGHT HANDLE (MISCELLANEOUS) ×2 IMPLANT
DRAIN CHANNEL 19F RND (DRAIN) IMPLANT
DRAPE HALF SHEET 40X57 (DRAPES) ×1 IMPLANT
DRAPE INCISE 23X17 STRL (DRAPES) ×1 IMPLANT
DRAPE INCISE IOBAN 23X17 STRL (DRAPES) ×1 IMPLANT
DRAPE INCISE IOBAN 66X45 STRL (DRAPES) ×1 IMPLANT
DRAPE SURG ORHT 6 SPLT 77X108 (DRAPES) ×2 IMPLANT
DRAPE U-SHAPE 47X51 STRL (DRAPES) IMPLANT
ELECT CAUTERY BLADE 6.4 (BLADE) ×1 IMPLANT
ELECTRODE REM PT RTRN 9FT ADLT (ELECTROSURGICAL) ×1 IMPLANT
EVACUATOR SILICONE 100CC (DRAIN) IMPLANT
GAUZE PAD ABD 8X10 STRL (GAUZE/BANDAGES/DRESSINGS) IMPLANT
GAUZE SPONGE 4X4 12PLY STRL (GAUZE/BANDAGES/DRESSINGS) ×2 IMPLANT
GAUZE XEROFORM 5X9 LF (GAUZE/BANDAGES/DRESSINGS) IMPLANT
GLOVE BIOGEL PI IND STRL 8 (GLOVE) ×1 IMPLANT
GOWN STRL REUS W/TWL 2XL LVL3 (GOWN DISPOSABLE) ×2 IMPLANT
KIT BASIN OR (CUSTOM PROCEDURE TRAY) ×1 IMPLANT
KIT TURNOVER KIT B (KITS) ×1 IMPLANT
NS IRRIG 1000ML POUR BTL (IV SOLUTION) ×1 IMPLANT
PACK GENERAL/GYN (CUSTOM PROCEDURE TRAY) ×1 IMPLANT
PAD ARMBOARD POSITIONER FOAM (MISCELLANEOUS) ×2 IMPLANT
RASP HELIOCORDIAL MED (MISCELLANEOUS) IMPLANT
STAPLER SKIN PROX 35W (STAPLE) ×1 IMPLANT
STOCKINETTE IMPERVIOUS LG (DRAPES) ×1 IMPLANT
SUT ETHILON 3 0 PS 1 (SUTURE) IMPLANT
SUT SILK 0 TIES 10X30 (SUTURE) ×1 IMPLANT
SUT SILK 2 0 SH CR/8 (SUTURE) ×1 IMPLANT
SUT SILK 2-0 18XBRD TIE 12 (SUTURE) ×1 IMPLANT
SUT VIC AB 0 CT1 27XBRD ANBCTR (SUTURE) ×1 IMPLANT
SUT VIC AB 2-0 CT1 18 (SUTURE) ×2 IMPLANT
SUT VIC AB 3-0 SH 18 (SUTURE) IMPLANT
TOWEL GREEN STERILE (TOWEL DISPOSABLE) ×2 IMPLANT
UNDERPAD 30X36 HEAVY ABSORB (UNDERPADS AND DIAPERS) ×1 IMPLANT
WATER STERILE IRR 1000ML POUR (IV SOLUTION) ×1 IMPLANT

## 2024-06-17 NOTE — Transfer of Care (Signed)
 Immediate Anesthesia Transfer of Care Note  Patient: Angela Hays  Procedure(s) Performed: AMPUTATION, LEFT ABOVE KNEE (Left: Knee)  Patient Location: PACU  Anesthesia Type:General  Level of Consciousness: awake, alert , and oriented  Airway & Oxygen Therapy: Patient Spontanous Breathing and Patient connected to face mask oxygen  Post-op Assessment: Report given to RN and Post -op Vital signs reviewed and stable  Post vital signs: Reviewed and stable  Last Vitals:  Vitals Value Taken Time  BP 137/60 06/17/24 08:47  Temp    Pulse 63 06/17/24 08:50  Resp 15 06/17/24 08:50  SpO2 100 % 06/17/24 08:50  Vitals shown include unfiled device data.  Last Pain:  Vitals:   06/17/24 0650  TempSrc:   PainSc: 8       Patients Stated Pain Goal: 3 (06/17/24 0650)  Complications: No notable events documented.

## 2024-06-17 NOTE — Discharge Instructions (Signed)
[facility] [place]  Discharge instructions  Lower Extremity Amputation  Please refer to the following instruction for your post-procedure care. Your surgeon or physician assistant will discuss any changes with you.  Activity  You are encouraged to walk as much as you can. You can slowly return to normal activities during the month after your surgery. Avoid strenuous activity and heavy lifting until your doctor tells you it's OK. Avoid activities such as vacuuming or swinging a golf club. Do not drive until your doctor give the OK and you are no longer taking prescription pain medications. It is also normal to have difficulty with sleep habits, eating and bowel movement after surgery. These will go away with time.  Bathing/Showering  Shower daily after you go home. Do not soak in a bathtub, hot tub, or swim until the incision heals completely.  Incision Care  Clean your incision with mild soap and water. Shower every day. Pat the area dry with a clean towel. You do not need a bandage unless otherwise instructed. Do not apply any ointments or creams to your incision. If you have open wounds you will be instructed how to care for them or a visiting nurse may be arranged for you. If you have staples or sutures along your incision they will be removed at your post-op appointment. You may have skin glue on your incision. Do not peel it off. It will come off on its own in about one week.  Diet  Resume your normal diet. There are no special food restrictions following this procedure. A low fat/ low cholesterol diet is recommended for all patients with vascular disease. In order to heal from your surgery, it is CRITICAL to get adequate nutrition. Your body requires vitamins, minerals, and protein. Vegetables are the best source of vitamins and minerals. Vegetables also provide the perfect balance of protein. Processed food has little nutritional value, so try to avoid  this.  Medications  Resume taking all your medications unless your doctor or physician assistant tells you not to. If your incision is causing pain, you may take over-the-counter pain relievers such as acetaminophen (Tylenol). If you were prescribed a stronger pain medication, please aware these medication can cause nausea and constipation. Prevent nausea by taking the medication with a snack or meal. Avoid constipation by drinking plenty of fluids and eating foods with high amount of fiber, such as fruits, vegetables, and grains. Take Colace 100 mg (an over-the-counter stool softener) twice a day as needed for constipation.  Do not take Tylenol if you are taking prescription pain medications.  Follow Up  Our office will schedule a follow up appointment 4 weeks following discharge.  Please call us immediately for any of the following conditions  Increase pain, redness, warmth, or drainage (pus) from your incision site(s) Fever of 101 degree or higher The swelling in your leg with the amputation suddenly worsens and becomes more painful than when you were in the hospital  Leg swelling is common after amputation surgery.  The swelling should improve over a few months following surgery. To improve the swelling, you may elevate your legs above the level of your heart while you are sitting or resting. Your surgeon or physician assistant may ask you to apply an ACE wrap or wear compression (TED) stockings to help to reduce swelling.  Reduce your risk of vascular disease  Stop smoking. If you would like help call [facility] at 1-800-QUIT-NOW (1-800-784-8669) or [place] at 336-586-4000.  Manage   your cholesterol Maintain a desired weight Control your diabetes weight Control your diabetes Keep your blood pressure down  If you have any questions, please call the office at [phone] 

## 2024-06-17 NOTE — H&P (Signed)
 History and Physical    Patient: Angela Hays FMW:[id number] DOB: 17-Jul-1951 DOA: 06/17/2024 DOS: the patient was seen and examined on 06/17/2024 PCP: Pcp, No  Patient coming from: Home  Chief Complaint: No chief complaint on file.  HPI: Angela Hays is a 73 y.o. female with medical history significant of HFpEF, HTN, HLD, DM2, CKD stage IV, HFpEF, CVA with residual left-sided weakness, prior R AKA, depression, and gout presents POD#0 for L AKA.  Pt states that she was in her USOH prior to presenting to hospital for planned L AKA. Pt underwent aforementioned procedure without any issues. Pt admitted to medicine for ongoing care with vascular surgery following.  Review of Systems: As mentioned in the history of present illness. All other systems reviewed and are negative. Past Medical History:  Diagnosis Date   Anemia    Anxiety    Chronic diastolic (congestive) heart failure (HCC)    Chronic kidney disease    Cognitive communication deficit    Depression    Diabetes mellitus without complication (HCC)    Hemiplegia (HCC)    Hyperkalemia 11/19/2019   Hyperlipidemia    Hyperparathyroidism (HCC)    Hypertension    Muscle weakness (generalized)    Obesity    Peripheral vascular disease (HCC)    Skin ulcer of great toe (HCC) 06/05/2024   Left   Stroke [facility])    Past Surgical History:  Procedure Laterality Date   AMPUTATION Right 10/31/2022   Procedure: RIGHT AMPUTATION ABOVE KNEE;  Surgeon: Lanis Fonda BRAVO, MD;  Location: [facility] OR;  Service: Vascular;  Laterality: Right;   CATARACT EXTRACTION  07/17/2019   HERNIA REPAIR     Umbilical   MASS EXCISION Right 06/10/2024   Procedure: EXCISION, MASS, CHEST WALL;  Surgeon: Eletha Boas, MD;  Location: [facility] ORS;  Service: General;  Laterality: Right;  EXCISION SOFIT TISSUE MASS RIGHT ANTERIOR CHEST WALL   PARATHYROID  EXPLORATION N/A 06/10/2024   Procedure: EXPLORATION, PARATHYROID ;  Surgeon: Eletha Boas, MD;  Location: [facility] ORS;  Service:  General;  Laterality: N/A;   PARATHYROIDECTOMY N/A 06/10/2024   Procedure: PARATHYROIDECTOMY;  Surgeon: Eletha Boas, MD;  Location: [facility] ORS;  Service: General;  Laterality: N/A;  NECK EXPLORATION WITH PARATHYROIDECTOMY   Social History:  reports that she has quit smoking. She has never used smokeless tobacco. She reports that she does not drink alcohol and does not use drugs.  No Known Allergies  History reviewed. No pertinent family history.  Prior to Admission medications   Medication Sig Start Date End Date Taking? Authorizing Provider  amLODipine  (NORVASC ) 10 MG tablet Take 1 tablet (10 mg total) by mouth every morning. 05/08/20  Yes Gonfa, Taye T, MD  cholecalciferol (VITAMIN D3) 25 MCG (1000 UNIT) tablet Take 1,000 Units by mouth daily.   Yes [provider]  cloNIDine  (CATAPRES ) 0.1 MG tablet Take 1 tablet (0.1 mg total) by mouth 2 (two) times daily. 05/08/20  Yes Gonfa, Taye T, MD  furosemide  (LASIX ) 40 MG tablet Take 40 mg by mouth 2 (two) times daily.   Yes [provider]  gabapentin  (NEURONTIN ) 100 MG capsule Take 100 mg by mouth 3 (three) times daily. 10/21/22  Yes [provider]  hydrALAZINE  (APRESOLINE ) 25 MG tablet Take 1 tablet (25 mg total) by mouth 3 (three) times daily. 11/04/22  Yes Jolaine Pac, DO  Insulin  Lispro-aabc (LYUMJEV KWIKPEN) 100 UNIT/ML KwikPen Inject 0-14 Units into the skin See admin instructions. Subcutaneously before meals and at bedtime CBG 70 -  120: 0 units CBG 121 - 150: 3 units CBG 151 - 200: 4 units CBG 201 - 250: 6 units CBG 251 - 300: 8 units CBG 301 - 350: 10 units CBG 351 - 400: 12 units CBG 401 - 999: 14 units Call MD for BS>400,   Yes [provider]  labetalol  (NORMODYNE ) 100 MG tablet Take 100 mg by mouth 2 (two) times daily. 06/06/18  Yes [provider]  LANTUS  SOLOSTAR 100 UNIT/ML Solostar Pen Inject 12-15 Units into the skin See admin instructions. Inject 15 units in the AM and 12 units in  the PM. Notify MD/ZNP if BS less than 70 or greater than 400.   Yes [provider]  potassium chloride  (MICRO-K ) 10 MEQ CR capsule Take 20 mEq by mouth daily. 10/23/22  Yes [provider]  senna (SENOKOT) 8.6 MG TABS tablet Take 2 tablets by mouth in the morning and at bedtime.   Yes [provider]  sertraline  (ZOLOFT ) 25 MG tablet Take 12.5 mg by mouth daily.   Yes [provider]  simvastatin  (ZOCOR ) 20 MG tablet Take 20 mg by mouth at bedtime.   Yes [provider]  sitaGLIPtin  (JANUVIA ) 50 MG tablet Take 50 mg by mouth daily.   Yes [provider]  clopidogrel  (PLAVIX ) 75 MG tablet Take 75 mg by mouth daily. Patient not taking: Reported on 06/06/2024    [provider]    Physical Exam: Vitals:   06/17/24 1230 06/17/24 1245 06/17/24 1300 06/17/24 1327  BP: 126/65 (!) 145/81 120/79 119/69  Pulse: 70 79 74 76  Resp: 17 16 17 17   Temp:   98 F (36.7 C) 100 F (37.8 C)  TempSrc:    Oral  SpO2: 98% 98% 98%   Weight:      Height:       General: Alert, oriented x3, resting comfortably in no acute distress Respiratory: Lungs clear to auscultation bilaterally with normal respiratory effort; no w/r/r Cardiovascular: Regular rate and rhythm w/o m/r/g   Data Reviewed:  Lab Results  Component Value Date   WBC 9.5 06/17/2024   HGB 12.1 06/17/2024   HCT 40.0 06/17/2024   MCV 93.7 06/17/2024   PLT 280 06/17/2024   Lab Results  Component Value Date   GLUCOSE 107 (H) 06/17/2024   CALCIUM  10.4 (H) 06/17/2024   NA 145 06/17/2024   K 4.4 06/17/2024   CO2 21 (L) 06/17/2024   CL 105 06/17/2024   BUN 57 (H) 06/17/2024   CREATININE 2.24 (H) 06/17/2024   Lab Results  Component Value Date   ALT 8 06/17/2024   AST 14 (L) 06/17/2024   ALKPHOS 121 06/17/2024   BILITOT 0.8 06/17/2024   Lab Results  Component Value Date   INR 1.1 06/17/2024   INR 1.1 10/28/2022   INR 1.42 05/06/2016    Radiology: No results  found.  Assessment and Plan: 25F h/o HFpEF, HTN, HLD, DM2, CKD stage IV, HFpEF, CVA with residual left-sided weakness, prior R AKA, depression, and gout presents POD#0 for L AKA.  L AKA -Vascular surgery following; apprec eval/recs -PT/OT consulted; apprec eval/recs; anticipate return to SNF -Multimodal pain control: PTA gabapentin  100mg  TID, Tylenol  1g TID sch, and oxycodone  5mg  q4h prn  HTN -PTA labetalol  and amlodipine  -HOLD pta clonidine  and hydralazine  for now  HLD -PTA Zocor  20mg  daily  DM2 -PTA long acting insulin  15U daily (pta 15U in AM and 12U in PM for unclear reasons); consider consolidating the doses -  SSI TID AC prn  HFpEF -PTA labetalol  and PO lasix  40mg  BID   Advance Care Planning:   Code Status: Full Code   Consults: Vascular surgery  Family Communication: N/A  Severity of Illness: The appropriate patient status for this patient is INPATIENT. Inpatient status is judged to be reasonable and necessary in order to provide the required intensity of service to ensure the patient's safety. The patient's presenting symptoms, physical exam findings, and initial radiographic and laboratory data in the context of their chronic comorbidities is felt to place them at high risk for further clinical deterioration. Furthermore, it is not anticipated that the patient will be medically stable for discharge from the hospital within 2 midnights of admission.   * I certify that at the point of admission it is my clinical judgment that the patient will require inpatient hospital care spanning beyond 2 midnights from the point of admission due to high intensity of service, high risk for further deterioration and high frequency of surveillance required.*   ------- I spent 55 minutes reviewing previous labs/notes, obtaining separate history at the bedside, counseling/discussing the treatment plan outlined above, ordering medications/tests, and performing clinical  documentation.  Author: Marsha Ada, MD 06/17/2024 2:27 PM  For on call review [email or web address].

## 2024-06-17 NOTE — H&P (Signed)
    Patient seen and examined in preop holding.  No complaints. No changes to medication history or physical exam since last seen in clinic. After discussing the risks and benefits of left leg above knee amputation, Angela Hays elected to proceed.   Fonda FORBES Rim MD   Patient is a 73 year old female presents today in the office with left-sided great toe wound, with significant pain. 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  On exam today, [name] was accompanied by her daughter.  She continues to live in assisted living.  There, she is in a wheelchair for the majority of the day and is moved using a [employer] lift.  She does not ambulate, nor if she able to stand and pivot.  The pain in the left foot is keeping her up at night.   No Known Allergies  Current Facility-Administered Medications  Medication Dose Route Frequency Provider Last Rate Last Admin   0.9 %  sodium chloride  infusion   Intravenous Continuous Arturo Sofranko E, MD       0.9 % irrigation (POUR BTL)    PRN Rim Fonda FORBES, MD   1,000 mL at 06/17/24 0726   ceFAZolin  (ANCEF ) IVPB 2g/100 mL premix  2 g Intravenous 30 min Pre-Op Rim Fonda FORBES, MD       Chlorhexidine  Gluconate Cloth 2 % PADS 6 each  6 each Topical Once Alicea Wente E, MD       And   Chlorhexidine  Gluconate Cloth 2 % PADS 6 each  6 each Topical Once Elise Knobloch E, MD       insulin  aspart (novoLOG ) injection 0-7 Units  0-7 Units Subcutaneous Q2H PRN Maryclare Cornet, MD       lactated ringers  infusion   Intravenous Continuous Maryclare Cornet, MD 10 mL/hr at 06/17/24 0706 New Bag at 06/17/24 0706     ROS:  See HPI  Physical Exam:  Vitals:   06/17/24 0642  BP: (!) 174/73  Pulse: 89  Resp: 18  Temp: 97.9 F (36.6 C)  SpO2: 93%     General: very pleasant Incision:  right AKA well healed Extremities: Left lower extremity with significant lymphedema.  Left great toe  wound.    Assessment/Plan:  This is a 73 y.o. female who is s/p: right AKA on 10/31/22.  Patient is not ambulatory, unable to stand and pivot.  Does not use the left lower extremity at all, and requires a [employer] lift.  I had a long discussion with [name] and her sister regarding the above.  I offered her continued wound care versus primary above-knee amputation.  After discussing the risks and benefits of both, [name] elected to proceed with primary above-knee amputation.

## 2024-06-17 NOTE — Op Note (Signed)
    NAME: [name]    MRN: [id number] DOB: 1951-10-29    DATE OF OPERATION: 06/17/2024  PREOP DIAGNOSIS:    Left foot tissue loss  POSTOP DIAGNOSIS:    Same  PROCEDURE:    Left above-knee amputation  SURGEON: Fonda FORBES Rim  ASSIST: Curry Damme, PA  ANESTHESIA: General  EBL: 250 mL  INDICATIONS:    Angela Hays is a 73 y.o. female who is s/p: right AKA on 10/31/22. She has rest pain and wounds on the left foot.  Patient is not ambulatory, unable to stand and pivot.  Does not use the left lower extremity at all, and requires a Hoyer lift.  I had a long discussion with [name] and her sister regarding the above.  I offered her continued wound care versus primary above-knee amputation.  After discussing the risks and benefits of both, [name] elected to proceed with primary above-knee amputation.  FINDINGS:    Unhealthy thigh muscle. Edema. Left hip contracture  TECHNIQUE:   Patient was brought to the OR laid in the supine position.  General anesthesia was induced and the patient was prepped and draped in standard fashion.  A timeout was performed.  A fish-mouth incision line was drawn 7cm from the patella for the left above-knee amputation. Sharp dissection and cautery was used to the femur.  Healthy bleeding was encountered. Muscle was dull and appeared unhealthy. Thigh muscles were divided and femoral artery and vein identified.  These were oversewn using two, 2-0 silk pop stick ties.  Once the circumferential exposure of the femur was complete, a reciprocating saw was used to divide the femur. This was followed by amputation knife through the back of the thigh.    Bleeding vessels oversewn using 2-0 silk suture. The sciatic nerve was identified, pulled to length and ligated with a zero silk tie. Once hemostasis was achieved, warm saline was brought to the field and the wound bed was irrigated.  I used a 0 Vicryl to close the periosteal tissue around the femur.  There was  no bleeding from the marrow.  Next, my attention turned to closure of the fascial layer.  This was done using 2-0 Vicryl pop suture.  The fascial layer was closed along the entirety of the incision.  A stapler was brought to the field to oppose the dermis.  An occlusive dressing was placed and the patient was taken the PACU in stable condition.  Given the complexity of the case a first assistant was necessary in order to expedient the procedure and safely perform the technical aspects of the operation.   Fonda FORBES Rim, MD [facility] DATE OF DICTATION:   06/17/2024

## 2024-06-17 NOTE — Anesthesia Procedure Notes (Signed)
 Procedure Name: Intubation Date/Time: 06/17/2024 7:40 AM  Performed by: Mannie Krystal LABOR, CRNAPre-anesthesia Checklist: Patient identified, Emergency Drugs available, Suction available and Patient being monitored Patient Re-evaluated:Patient Re-evaluated prior to induction Oxygen Delivery Method: Circle system utilized Preoxygenation: Pre-oxygenation with 100% oxygen Induction Type: IV induction Ventilation: Mask ventilation without difficulty Laryngoscope Size: Miller and 2 Grade View: Grade I Tube type: Oral Tube size: 7.0 mm Number of attempts: 1 Airway Equipment and Method: Stylet and Oral airway Placement Confirmation: ETT inserted through vocal cords under direct vision, positive ETCO2 and breath sounds checked- equal and bilateral Secured at: 22 cm Tube secured with: Tape Dental Injury: Teeth and Oropharynx as per pre-operative assessment

## 2024-06-17 NOTE — Anesthesia Preprocedure Evaluation (Signed)
 Anesthesia Evaluation  Patient identified by MRN, date of birth, ID band Patient awake    Reviewed: Allergy & Precautions, H&P , NPO status , Patient's Chart, lab work & pertinent test results  Airway Mallampati: II   Neck ROM: full    Dental   Pulmonary former smoker   breath sounds clear to auscultation       Cardiovascular hypertension, + Peripheral Vascular Disease and +CHF   Rhythm:regular Rate:Normal     Neuro/Psych  PSYCHIATRIC DISORDERS Anxiety Depression    CVA    GI/Hepatic   Endo/Other  diabetes, Type 2    Renal/GU Renal InsufficiencyRenal disease     Musculoskeletal   Abdominal   Peds  Hematology   Anesthesia Other Findings   Reproductive/Obstetrics                             Anesthesia Physical Anesthesia Plan  ASA: 3  Anesthesia Plan: General   Post-op Pain Management:    Induction: Intravenous  PONV Risk Score and Plan: 3 and Ondansetron , Dexamethasone  and Treatment may vary due to age or medical condition  Airway Management Planned: Oral ETT  Additional Equipment:   Intra-op Plan:   Post-operative Plan: Extubation in OR  Informed Consent: I have reviewed the patients History and Physical, chart, labs and discussed the procedure including the risks, benefits and alternatives for the proposed anesthesia with the patient or authorized representative who has indicated his/her understanding and acceptance.     Dental advisory given  Plan Discussed with: CRNA, Anesthesiologist and Surgeon  Anesthesia Plan Comments:        Anesthesia Quick Evaluation

## 2024-06-18 ENCOUNTER — Encounter (HOSPITAL_COMMUNITY): Payer: Self-pay | Admitting: Vascular Surgery

## 2024-06-18 DIAGNOSIS — Z89612 Acquired absence of left leg above knee: Secondary | ICD-10-CM | POA: Diagnosis not present

## 2024-06-18 DIAGNOSIS — Z4889 Encounter for other specified surgical aftercare: Secondary | ICD-10-CM

## 2024-06-18 LAB — GLUCOSE, CAPILLARY
Glucose-Capillary: 144 mg/dL — ABNORMAL HIGH (ref 70–99)
Glucose-Capillary: 135 mg/dL — ABNORMAL HIGH (ref 70–99)
Glucose-Capillary: 125 mg/dL — ABNORMAL HIGH (ref 70–99)
Glucose-Capillary: 83 mg/dL (ref 70–99)

## 2024-06-18 LAB — SURGICAL PATHOLOGY

## 2024-06-18 NOTE — Anesthesia Postprocedure Evaluation (Signed)
 Anesthesia Post Note  Patient: Angela Hays  Procedure(s) Performed: AMPUTATION, LEFT ABOVE KNEE (Left: Knee)     Patient location during evaluation: PACU Anesthesia Type: General Level of consciousness: awake and alert Pain management: pain level controlled Vital Signs Assessment: post-procedure vital signs reviewed and stable Respiratory status: spontaneous breathing, nonlabored ventilation, respiratory function stable and patient connected to nasal cannula oxygen Cardiovascular status: blood pressure returned to baseline and stable Postop Assessment: no apparent nausea or vomiting Anesthetic complications: no   No notable events documented.  Last Vitals:  Vitals:   06/18/24 0035 06/18/24 0459  BP: 133/68 (!) 121/59  Pulse: 76 75  Resp: 18 18  Temp: 36.9 C 36.8 C  SpO2: 92% 94%    Last Pain:  Vitals:   06/18/24 0459  TempSrc: Oral  PainSc:                  Mauri Temkin S

## 2024-06-18 NOTE — Evaluation (Signed)
 Occupational Therapy Evaluation Patient Details Name: Angela Hays MRN: [id number] DOB: [birth year] Today's Date: 06/18/2024   History of Present Illness   Angela Hays is a 73 y.o. female who is s/p L AKA. PMHx: HFpEF, HTN, HLD, DM2, CKD stage IV, HFpEF, CVA with residual left-sided weakness, prior R AKA, depression, and gout     Clinical Impressions Angela Hays was evaluated s/p the above admission list. She is from SNF where staff hoyers her OOB to her WC daily, she is dependent for ADLs at bed level but is mod I for bed mobility at baseline. Upon evaluation the pt was limited by new L AKA and now BLE AKA, L residual limb pain, unsteady sitting balance and limited tolerance for activity. Overall she required up to mod A for bed mobility and min-max A for sitting balance. Due to the deficits listed below the pt also needs set up-min A for UB ADLs and total A for LB ADLs. Pt will benefit from continued acute OT services and skilled inpatient follow up therapy, <3 hours/day.      If plan is discharge home, recommend the following:   A lot of help with walking and/or transfers;Two people to help with walking and/or transfers;A lot of help with bathing/dressing/bathroom;Two people to help with bathing/dressing/bathroom;Direct supervision/assist for medications management;Direct supervision/assist for financial management;Assist for transportation;Help with stairs or ramp for entrance     Functional Status Assessment   Patient has had a recent decline in their functional status and demonstrates the ability to make significant improvements in function in a reasonable and predictable amount of time.     Equipment Recommendations   None recommended by OT (pt wishes to have a PWC)      Precautions/Restrictions   Precautions Precautions: Fall Precaution/Restrictions Comments: bilateral AKA without prosthetics Restrictions Weight Bearing Restrictions Per Provider Order: No      Mobility Bed Mobility Overal bed mobility: Needs Assistance Bed Mobility: Rolling, Supine to Sit, Sit to Supine Rolling: Mod assist, Used rails   Supine to sit: +2 for physical assistance, Mod assist Sit to supine: Min assist   General bed mobility comments: used chuck pads to assist. Upon sitting EOB, pt with posterior bias and heavy reliance on RUE support on bedrail for assist. Required mod A fading to min A for static sitting balance.    Transfers                   General transfer comment: not performed - based off PLOF and current impairments anticipate pt to be +2 assist using Deitra          ADL either performed or assessed with clinical judgement   ADL Overall ADL's : Needs assistance/impaired Eating/Feeding: Independent;Bed level   Grooming: Set up;Bed level   Upper Body Bathing: Minimal assistance;Bed level   Lower Body Bathing: Maximal assistance;Bed level   Upper Body Dressing : Minimal assistance;Bed level   Lower Body Dressing: Total assistance   Toilet Transfer: Total assistance   Toileting- Clothing Manipulation and Hygiene: Total assistance       Functional mobility during ADLs: Moderate assistance;+2 for physical assistance;+2 for safety/equipment General ADL Comments: limited by BLE AKA, pain, weakness     Vision Baseline Vision/History: 0 No visual deficits Vision Assessment?: No apparent visual deficits     Perception Perception: Within Functional Limits       Praxis Praxis: WFL       Pertinent Vitals/Pain Pain Assessment Pain Assessment: Faces Faces Pain Scale: Hurts  a little bit Pain Location: L stump Pain Descriptors / Indicators: Sore Pain Intervention(s): Monitored during session, Limited activity within patient's tolerance     Extremity/Trunk Assessment Upper Extremity Assessment Upper Extremity Assessment: Generalized weakness   Lower Extremity Assessment Lower Extremity Assessment: Defer to PT evaluation    Cervical / Trunk Assessment Cervical / Trunk Assessment: Normal   Communication Communication Communication: No apparent difficulties   Cognition Arousal: Alert Behavior During Therapy: WFL for tasks assessed/performed Cognition: No apparent impairments             OT - Cognition Comments: overall WFL, motivated                 Following commands: Intact       Cueing  General Comments   Cueing Techniques: Verbal cues;Tactile cues  VSS   Exercises     Shoulder Instructions      Home Living Family/patient expects to be discharged to:: Skilled nursing facility                                 Additional Comments: [facility]      Prior Functioning/Environment Prior Level of Function : Needs assist       Physical Assist : Mobility (physical);ADLs (physical) Mobility (physical): Bed mobility;Transfers   Mobility Comments: mod I bed mobility, hoyer to WC, was able to mobilize at wc level with L leg ADLs Comments: bed level ADLs, dependent. Pt states she does go to the sink in her wc to groom    OT Problem List: Decreased strength;Decreased range of motion;Decreased activity tolerance;Impaired balance (sitting and/or standing);Decreased safety awareness;Pain   OT Treatment/Interventions: Self-care/ADL training;Therapeutic exercise;DME and/or AE instruction;Therapeutic activities;Balance training;Patient/family education      OT Goals(Current goals can be found in the care plan section)   Acute Rehab OT Goals Patient Stated Goal: back to SNF OT Goal Formulation: With patient Time For Goal Achievement: 07/02/24 Potential to Achieve Goals: Good   OT Frequency:  Min 1X/week    Co-evaluation PT/OT/SLP Co-Evaluation/Treatment: Yes Reason for Co-Treatment: Complexity of the patient's impairments (multi-system involvement);For patient/therapist safety;To address functional/ADL transfers   OT goals addressed during session:  ADL's and self-care      AM-PAC OT 6 Clicks Daily Activity     Outcome Measure Help from another person eating meals?: None Help from another person taking care of personal grooming?: A Little Help from another person toileting, which includes using toliet, bedpan, or urinal?: Total Help from another person bathing (including washing, rinsing, drying)?: A Lot Help from another person to put on and taking off regular upper body clothing?: A Little Help from another person to put on and taking off regular lower body clothing?: Total 6 Click Score: 14   End of Session Nurse Communication: Mobility status  Activity Tolerance: Patient tolerated treatment well Patient left: in bed;with call bell/phone within reach;with bed alarm set;with nursing/sitter in room  OT Visit Diagnosis: Muscle weakness (generalized) (M62.81);Pain;Other abnormalities of gait and mobility (R26.89)                Time: 9178-9160 OT Time Calculation (min): 18 min Charges:  OT General Charges $OT Visit: 1 Visit OT Evaluation $OT Eval Moderate Complexity: 1 Mod  Lucie Kendall, OTR/L Acute Rehabilitation Services Office [phone] Secure Chat Communication Preferred   Lucie JONETTA Kendall 06/18/2024, 9:18 AM

## 2024-06-18 NOTE — Progress Notes (Addendum)
  Progress Note    06/18/2024 7:38 AM 1 Day Post-Op  Subjective:  no complaints.  Not experiencing any pain   Vitals:   06/18/24 0035 06/18/24 0459  BP: 133/68 (!) 121/59  Pulse: 76 75  Resp: 18 18  Temp: 98.4 F (36.9 C) 98.2 F (36.8 C)  SpO2: 92% 94%    Physical Exam: Incisions:  L AKA dressing left in place.   CBC    Component Value Date/Time   WBC 9.5 06/17/2024 0630   RBC 4.27 06/17/2024 0630   HGB 12.1 06/17/2024 0630   HCT 40.0 06/17/2024 0630   HCT 38.4 06/19/2019 0245   PLT 280 06/17/2024 0630   MCV 93.7 06/17/2024 0630   MCH 28.3 06/17/2024 0630   MCHC 30.3 06/17/2024 0630   RDW 15.0 06/17/2024 0630   LYMPHSABS 0.9 10/29/2022 0248   MONOABS 0.6 10/29/2022 0248   EOSABS 0.3 10/29/2022 0248   BASOSABS 0.1 10/29/2022 0248    BMET    Component Value Date/Time   NA 145 06/17/2024 0630   K 4.4 06/17/2024 0630   CL 105 06/17/2024 0630   CO2 21 (L) 06/17/2024 0630   GLUCOSE 107 (H) 06/17/2024 0630   BUN 57 (H) 06/17/2024 0630   CREATININE 2.24 (H) 06/17/2024 0630   CALCIUM  10.4 (H) 06/17/2024 0630   GFRNONAA 22 (L) 06/17/2024 0630   GFRAA 36 (L) 05/08/2020 0642    INR    Component Value Date/Time   INR 1.1 06/17/2024 0630     Intake/Output Summary (Last 24 hours) at 06/18/2024 9261 Last data filed at 06/18/2024 0500 Gross per 24 hour  Intake 740 ml  Output 250 ml  Net 490 ml     Assessment/Plan:  73 y.o. female is s/p left above knee amputation  1 Day Post-Op  Subjectively, pain is well controlled Continue dressing; vascular will remove on Friday PT/OT today; TOC to arrange discharge back to facility when ready   Donnice Sender, PA-C [facility] [phone] 06/18/2024 7:38 AM  VASCULAR STAFF ADDENDUM: I have independently interviewed and examined the patient. I agree with the above.  Pt doing well. Pain controlled.  Plan for dressing of Friday. PT/OT. Appreciate hospital medicine's  involvement.   Fonda FORBES Rim MD [facility] Phone Number: [phone] 06/18/2024 11:48 AM

## 2024-06-18 NOTE — Progress Notes (Signed)
Food order placed

## 2024-06-18 NOTE — Progress Notes (Signed)
  Progress Note   Patient: Angela Hays FMW:[id number] DOB: [birth year] DOA: 06/17/2024     1 DOS: the patient was seen and examined on 06/18/2024   Brief hospital course: Angela Hays is a 73 y.o. female with medical history significant of HFpEF, HTN, HLD, DM2, CKD stage IV, HFpEF, CVA with residual left-sided weakness, prior R AKA, depression, and gout presents POD#0 for L AKA. Back to SNF on discharge.  Assessment and Plan:  60F h/o HFpEF, HTN, HLD, DM2, CKD stage IV, HFpEF, CVA with residual left-sided weakness, prior R AKA, depression, and gout presents POD#0 for L AKA.   Left AKA,POA: POD #1 -Vascular surgery following; apprec eval/recs -PT/OT consulted; apprec eval/recs; anticipate return to SNF -Multimodal pain control: PTA gabapentin  100mg  TID, Tylenol  1g TID sch, and oxycodone  5mg  q4h prn   Essential Hypertension: -PTA labetalol  and amlodipine  -HOLD pta clonidine  and hydralazine  for now   Hyperlipidemia -PTA Zocor  20mg  daily   DM type 2 with peripheral neuropathy and peripheral vascular disease,POA: -PTA long acting insulin  15U daily (pta 15U in AM and 12U in PM for unclear reasons); consider consolidating the doses -SSI TID AC prn  S/p left inferior parathyroidectomy and excision of soft tissue mass from chest wall. Done on 6/16 by Dr Angela Hays for primary hyperparathyroidism   HFpEF/chronic diastolic CHF,POA: NYHA II. Not in exacerbation. -PTA labetalol  and PO lasix  40mg  BID     Advance Care Planning:   Code Status: Full Code    Consults: Vascular surgery     Subjective: She said that she feels great.  She is postop day 1 left above-knee amputation.  Slight soreness over the left stump.  She is from skilled nursing facility  Physical Exam: Vitals:   06/17/24 2043 06/18/24 0035 06/18/24 0459 06/18/24 0749  BP: 133/64 133/68 (!) 121/59 132/67  Pulse: 73 76 75 76  Resp: 18 18 18 18   Temp: 98.7 F (37.1 C) 98.4 F (36.9 C) 98.2 F (36.8 C) 98.3 F (36.8 C)  TempSrc:  Oral Oral Oral Oral  SpO2: 95% 92% 94% 95%  Weight:      Height:       Constitutional: NAD, calm, comfortable Eyes: PERRL, lids and conjunctivae normal ENMT: Mucous membranes are moist. Posterior pharynx clear of any exudate or lesions.Normal dentition.  Neck: normal, supple, no masses, no thyromegaly Respiratory: clear to auscultation bilaterally, no wheezing, no crackles. Normal respiratory effort. No accessory muscle use.  Cardiovascular: Regular rate and rhythm, no murmurs / rubs / gallops. No extremity edema. No carotid bruits.  Abdomen: no tenderness, no masses palpated. No hepatosplenomegaly. Bowel sounds positive.  Musculoskeletal: s/p b/l AKA, left stump dressing in place Skin: no rashes, lesions, ulcers. No induration Neurologic: CN 2-12 grossly intact. UE strength 5/5. S/p b/l AKA Psychiatric: Normal judgment and insight. Alert and oriented x 3. Normal mood.    Data Reviewed:  There are no new results to review at this time.  Family Communication: None at the bedside  Disposition: Status is: Inpatient Remains inpatient appropriate because: s/p Left AKA  Planned Discharge Destination: Skilled nursing facility    Time spent: 41 minutes  Author: Deliliah Room, MD 06/18/2024 9:52 AM  For on call review [email or web address].

## 2024-06-18 NOTE — Evaluation (Signed)
 Physical Therapy Evaluation Patient Details Name: Angela Hays MRN: [id number] DOB: [birth year] Today's Date: 06/18/2024  History of Present Illness  Pt is a 73 y/o F s/p L AKA on 6/23. PMH includes HFpEF, HTN, HLD, DM2, CKD stage IV, HFpEF, CVA with residual left-sided weakness, prior R AKA, depression, and gout presents POD#0 for L AKA.  Clinical Impression  Received pt semi-reclined in bed and agreeable to PT/OT evaluation. Pt able to roll L/R with mod A using bedrails and transitioned to EOB using bed features with min/mod A +2 using chuck pads. Upon sitting EOB, pt with heavy reliance on RUE support on bedrail and with posterior bias requiring mod fading to min A for static sitting balance. While sitting EOB, pt washed face using RUE with assist for trunk control. Pt reports at baseline, requiring Hoyer lift for transfers to/from [facility] and once in [facility] was able to propel using BUE and LLE. Pt requesting new WC and may benefit from power WC or custom WC upon returning to SNF.         If plan is discharge home, recommend the following: Two people to help with walking and/or transfers;A lot of help with bathing/dressing/bathroom;Assistance with cooking/housework;Assistance with feeding;Help with stairs or ramp for entrance;Assist for transportation   Can travel by private vehicle   No    Equipment Recommendations None recommended by PT (already has [employer] lift; pt inquiring about custom WC. May need new WC upon returning to SNF)  Recommendations for Other Services       Functional Status Assessment Patient has had a recent decline in their functional status and demonstrates the ability to make significant improvements in function in a reasonable and predictable amount of time.     Precautions / Restrictions Precautions Precautions: Fall Precaution/Restrictions Comments: bilateral AKA without prosthetics Restrictions Weight Bearing Restrictions Per Provider Order: No      Mobility  Bed  Mobility Overal bed mobility: Needs Assistance Bed Mobility: Rolling, Supine to Sit, Sit to Supine Rolling: Mod assist, Used rails   Supine to sit: +2 for physical assistance, Mod assist Sit to supine: Min assist   General bed mobility comments: used chuck pads to assist. Upon sitting EOB, pt with posterior bias and heavy reliance on RUE support on bedrail for assist. Required mod A fading to min A for static sitting balance. Patient Response: Cooperative  Transfers                   General transfer comment: not performed - based off PLOF and current impairments anticipate pt to be +2 assist using Deitra    Ambulation/Gait               General Gait Details: unable  Stairs            Wheelchair Mobility     Tilt Bed Tilt Bed Patient Response: Cooperative  Modified Rankin (Stroke Patients Only)       Balance Overall balance assessment: Needs assistance Sitting-balance support: Single extremity supported, Feet unsupported Sitting balance-Leahy Scale: Poor Sitting balance - Comments: pt required mod A fading to min A to maintain static sitting balance with heavy reliance on RUE support on bedrails. Pt able to follow cues to correct posterior lean Postural control: Posterior lean     Standing balance comment: unable                             Pertinent Vitals/Pain Pain  Assessment Pain Assessment: No/denies pain (just soreness)    Home Living Family/patient expects to be discharged to:: Skilled nursing facility                        Prior Function Prior Level of Function : Needs assist       Physical Assist : Mobility (physical);ADLs (physical) Mobility (physical): Bed mobility;Transfers   Mobility Comments: Pt used Hoyer lift at baseline to get to/from [facility]. Once in [facility] was able to propel using BUE and LLE prior to LLE AKA       Extremity/Trunk Assessment   Upper Extremity Assessment Upper Extremity Assessment:  Defer to OT evaluation    Lower Extremity Assessment Lower Extremity Assessment: Generalized weakness    Cervical / Trunk Assessment Cervical / Trunk Assessment: Normal  Communication   Communication Communication: No apparent difficulties    Cognition Arousal: Alert Behavior During Therapy: WFL for tasks assessed/performed   PT - Cognitive impairments: No apparent impairments                       PT - Cognition Comments: WFL for tasks assessed. Following commands: Intact       Cueing Cueing Techniques: Verbal cues, Tactile cues     General Comments      Exercises     Assessment/Plan    PT Assessment Patient needs continued PT services  PT Problem List Decreased strength;Decreased range of motion;Decreased activity tolerance;Decreased balance;Decreased mobility;Decreased coordination       PT Treatment Interventions DME instruction;Functional mobility training;Therapeutic activities;Therapeutic exercise;Balance training;Neuromuscular re-education;Wheelchair mobility training    PT Goals (Current goals can be found in the Care Plan section)  Acute Rehab PT Goals Patient Stated Goal: to go home PT Goal Formulation: With patient Time For Goal Achievement: 07/02/24 Potential to Achieve Goals: Fair    Frequency Min 1X/week     Co-evaluation               AM-PAC PT 6 Clicks Mobility  Outcome Measure Help needed turning from your back to your side while in a flat bed without using bedrails?: A Lot Help needed moving from lying on your back to sitting on the side of a flat bed without using bedrails?: Total Help needed moving to and from a bed to a chair (including a wheelchair)?: Total Help needed standing up from a chair using your arms (e.g., wheelchair or bedside chair)?: Total Help needed to walk in hospital room?: Total Help needed climbing 3-5 steps with a railing? : Total 6 Click Score: 7    End of Session   Activity Tolerance:  Patient tolerated treatment well Patient left: in bed;with call bell/phone within reach;with bed alarm set;with nursing/sitter in room Nurse Communication: Need for lift equipment PT Visit Diagnosis: Muscle weakness (generalized) (M62.81);Other abnormalities of gait and mobility (R26.89)    Time: 9178-9161 PT Time Calculation (min) (ACUTE ONLY): 17 min   Charges:   PT Evaluation $PT Eval Moderate Complexity: 1 Mod   PT General Charges $$ ACUTE PT VISIT: 1 Visit         Therisa Stains PT, DPT Therisa HERO Zaunegger 06/18/2024, 9:14 AM

## 2024-06-19 DIAGNOSIS — L97529 Non-pressure chronic ulcer of other part of left foot with unspecified severity: Secondary | ICD-10-CM

## 2024-06-19 LAB — GLUCOSE, CAPILLARY
Glucose-Capillary: 116 mg/dL — ABNORMAL HIGH (ref 70–99)
Glucose-Capillary: 204 mg/dL — ABNORMAL HIGH (ref 70–99)
Glucose-Capillary: 125 mg/dL — ABNORMAL HIGH (ref 70–99)
Glucose-Capillary: 100 mg/dL — ABNORMAL HIGH (ref 70–99)

## 2024-06-19 LAB — CBC WITH DIFFERENTIAL/PLATELET
Abs Immature Granulocytes: 0.06 10*3/uL (ref 0.00–0.07)
Basophils Absolute: 0.1 10*3/uL (ref 0.0–0.1)
Basophils Relative: 1 %
Eosinophils Absolute: 0.2 10*3/uL (ref 0.0–0.5)
Eosinophils Relative: 3 %
HCT: 30.4 % — ABNORMAL LOW (ref 36.0–46.0)
Hemoglobin: 9.5 g/dL — ABNORMAL LOW (ref 12.0–15.0)
Immature Granulocytes: 1 %
Lymphocytes Relative: 15 %
Lymphs Abs: 1.3 10*3/uL (ref 0.7–4.0)
MCH: 28.3 pg (ref 26.0–34.0)
MCHC: 31.3 g/dL (ref 30.0–36.0)
MCV: 90.5 fL (ref 80.0–100.0)
Monocytes Absolute: 0.7 10*3/uL (ref 0.1–1.0)
Monocytes Relative: 8 %
Neutro Abs: 6.5 10*3/uL (ref 1.7–7.7)
Neutrophils Relative %: 72 %
Platelets: 221 10*3/uL (ref 150–400)
RBC: 3.36 MIL/uL — ABNORMAL LOW (ref 3.87–5.11)
RDW: 15.3 % (ref 11.5–15.5)
WBC: 8.8 10*3/uL (ref 4.0–10.5)
nRBC: 0 % (ref 0.0–0.2)

## 2024-06-19 LAB — BASIC METABOLIC PANEL WITH GFR
Anion gap: 8 (ref 5–15)
BUN: 65 mg/dL — ABNORMAL HIGH (ref 8–23)
CO2: 25 mmol/L (ref 22–32)
Calcium: 9.7 mg/dL (ref 8.9–10.3)
Chloride: 107 mmol/L (ref 98–111)
Creatinine, Ser: 2.1 mg/dL — ABNORMAL HIGH (ref 0.44–1.00)
GFR, Estimated: 24 mL/min — ABNORMAL LOW (ref >60)
Glucose, Bld: 105 mg/dL — ABNORMAL HIGH (ref 70–99)
Potassium: 3.4 mmol/L — ABNORMAL LOW (ref 3.5–5.1)
Sodium: 140 mmol/L (ref 135–145)

## 2024-06-19 NOTE — Plan of Care (Signed)

## 2024-06-19 NOTE — Progress Notes (Addendum)
  Progress Note    06/19/2024 6:29 AM 2 Days Post-Op  Subjective:  pt sleeping and I did not wake her.  RN reports she had a good night with good pain control  Afebrile  Vitals:   06/18/24 2033 06/19/24 0422  BP: 124/63 137/61  Pulse: 78 71  Resp: 16 18  Temp: 98.2 F (36.8 C) 98.2 F (36.8 C)  SpO2: 91% 92%       CBC    Component Value Date/Time   WBC 8.8 06/19/2024 0542   RBC 3.36 (L) 06/19/2024 0542   HGB 9.5 (L) 06/19/2024 0542   HCT 30.4 (L) 06/19/2024 0542   HCT 38.4 06/19/2019 0245   PLT 221 06/19/2024 0542   MCV 90.5 06/19/2024 0542   MCH 28.3 06/19/2024 0542   MCHC 31.3 06/19/2024 0542   RDW 15.3 06/19/2024 0542   LYMPHSABS 1.3 06/19/2024 0542   MONOABS 0.7 06/19/2024 0542   EOSABS 0.2 06/19/2024 0542   BASOSABS 0.1 06/19/2024 0542    BMET    Component Value Date/Time   NA 145 06/17/2024 0630   K 4.4 06/17/2024 0630   CL 105 06/17/2024 0630   CO2 21 (L) 06/17/2024 0630   GLUCOSE 107 (H) 06/17/2024 0630   BUN 57 (H) 06/17/2024 0630   CREATININE 2.24 (H) 06/17/2024 0630   CALCIUM  10.4 (H) 06/17/2024 0630   GFRNONAA 22 (L) 06/17/2024 0630   GFRAA 36 (L) 05/08/2020 0642    INR    Component Value Date/Time   INR 1.1 06/17/2024 0630     Intake/Output Summary (Last 24 hours) at 06/19/2024 9370 Last data filed at 06/18/2024 2133 Gross per 24 hour  Intake 476 ml  Output 550 ml  Net -74 ml     Assessment/Plan:  73 y.o. female is s/p left above knee amputation   2 Days Post-Op   -pt sleeping and I did not wake her.  Per RN, good pain control overnight -dressing will be taken down on Friday 6/27.   Lucie Apt, PA-C [facility] [phone] 06/19/2024 6:29 AM   VASCULAR STAFF ADDENDUM: I have independently interviewed and examined the patient. I agree with the above.  Doing well. Dressing down Friday  Fonda FORBES Rim MD [facility] Phone Number: [phone] 06/19/2024 5:57 PM

## 2024-06-19 NOTE — Progress Notes (Addendum)
 PROGRESS NOTE    Angela Hays  FMW:[id number] DOB: [birth year] DOA: 06/17/2024 PCP: Pcp, No  No chief complaint on file.   Brief Narrative:   58F h/o HFpEF, HTN, HLD, DM2, CKD stage IV, HFpEF, CVA with residual left-sided weakness, prior R AKA, depression, and gout was seen by the hospitalist service POD 0 after her L AKA.  Assessment & Plan:   Principal Problem:   S/P AKA (above knee amputation) (HCC)  Left AKA,POA: POD #1 -Vascular surgery following; apprec eval/recs - plan for take down of dressing on 6/27 -PT/OT consulted; apprec eval/recs; anticipate return to SNF -Multimodal pain control: PTA gabapentin  100mg  TID, Tylenol  1g TID sch, and oxycodone  5mg  q4h prn   Essential Hypertension: -PTA labetalol  and amlodipine  -lasix   -HOLD pta clonidine  and hydralazine  for now -BP ok at this time   Hyperlipidemia -PTA Zocor  20mg  daily   DM type 2 with peripheral neuropathy and peripheral vascular disease,POA: -PTA long acting insulin  15U daily (pta 15U in AM and 12U in PM for unclear reasons); consider consolidating the doses -SSI TID AC prn -gabapentin     S/p left inferior parathyroidectomy and excision of soft tissue mass from chest wall. Done on 6/16 by Dr Eletha for primary hyperparathyroidism   HFpEF/chronic diastolic CHF,POA: NYHA II. Not in exacerbation. -PTA labetalol  and PO lasix  40mg  BID   Depression - zoloft    CKD IV Unclear baseline    DVT prophylaxis: lovenox  Code Status: full Family Communication: none Disposition:   Status is: Inpatient Remains inpatient appropriate because: need for continued vascular care   Consultants:  vascular  Procedures:  Summary:  Right: AKA.    Left: Resting left ankle-brachial index indicates noncompressible left  lower extremity arteries.  Left toe index could not be obtained due to patient discomfort.   Antimicrobials:  Anti-infectives (From admission, onward)    Start     Dose/Rate Route Frequency Ordered  Stop   06/17/24 0624  ceFAZolin  (ANCEF ) IVPB 2g/100 mL premix        2 g 200 mL/hr over 30 Minutes Intravenous 30 min pre-op 06/17/24 0624 06/17/24 0746       Subjective: Says she feels wonderful   Objective: Vitals:   06/18/24 2033 06/19/24 0422 06/19/24 0801 06/19/24 1627  BP: 124/63 137/61 (!) 142/70 (!) 125/54  Pulse: 78 71 77 75  Resp: 16 18 16 16   Temp: 98.2 F (36.8 C) 98.2 F (36.8 C) 98.2 F (36.8 C) 98.8 F (37.1 C)  TempSrc: Oral Oral Oral Oral  SpO2: 91% 92% 93% 94%  Weight:      Height:        Intake/Output Summary (Last 24 hours) at 06/19/2024 1737 Last data filed at 06/19/2024 1000 Gross per 24 hour  Intake 240 ml  Output 900 ml  Net -660 ml   Filed Weights   06/17/24 0642  Weight: 74.8 kg    Examination:  General exam: Appears calm and comfortable  Respiratory system: unlabored Cardiovascular system: RRR Gastrointestinal system: Abdomen is nondistended, soft and nontender.  Central nervous system: Alert and oriented. No focal neurological deficits. Extremities: bilateral AKA, vac to LLE    Data Reviewed: I have personally reviewed following labs and imaging studies  CBC: Recent Labs  Lab 06/17/24 0630 06/19/24 0542  WBC 9.5 8.8  NEUTROABS  --  6.5  HGB 12.1 9.5*  HCT 40.0 30.4*  MCV 93.7 90.5  PLT 280 221    Basic Metabolic Panel: Recent Labs  Lab 06/17/24 0630 06/19/24  0542  NA 145 140  K 4.4 3.4*  CL 105 107  CO2 21* 25  GLUCOSE 107* 105*  BUN 57* 65*  CREATININE 2.24* 2.10*  CALCIUM  10.4* 9.7    GFR: Estimated Creatinine Clearance: 22.4 mL/min (Angela Hays) (by C-G formula based on SCr of 2.1 mg/dL (H)).  Liver Function Tests: Recent Labs  Lab 06/17/24 0630  AST 14*  ALT 8  ALKPHOS 121  BILITOT 0.8  PROT 7.0  ALBUMIN 3.6    CBG: Recent Labs  Lab 06/18/24 1557 06/18/24 2042 06/19/24 0801 06/19/24 1154 06/19/24 1628  GLUCAP 125* 83 116* 204* 125*     Recent Results (from the past 240 hours)  Surgical pcr  screen     Status: None   Collection Time: 06/10/24  5:34 AM   Specimen: Nasal Mucosa; Nasal Swab  Result Value Ref Range Status   MRSA, PCR NEGATIVE NEGATIVE Final   Staphylococcus aureus NEGATIVE NEGATIVE Final    Comment: (NOTE) The Xpert SA Assay (FDA approved for NASAL specimens in patients 33 years of age and older), is one component of Angela Hays comprehensive surveillance program. It is not intended to diagnose infection nor to guide or monitor treatment. Performed at [facility], [address], [place], [place] [address]          Radiology Studies: No results found.      Scheduled Meds:  acetaminophen   1,000 mg Oral TID   amLODipine   10 mg Oral q morning   cholecalciferol  1,000 Units Oral Daily   enoxaparin  (LOVENOX ) injection  30 mg Subcutaneous Q24H   furosemide   40 mg Oral BID   gabapentin   100 mg Oral TID   insulin  aspart  0-15 Units Subcutaneous TID WC   insulin  glargine-yfgn  15 Units Subcutaneous Daily   labetalol   100 mg Oral BID   sertraline   12.5 mg Oral Daily   simvastatin   20 mg Oral QHS   Continuous Infusions:   LOS: 2 days    Time spent: over 30 min    Angela Monte, MD [facility]   To contact the attending provider between 7A-7P or the covering provider during after hours 7P-7A, please log into the web site [email or web address] and access using universal [place] password for that web site. If you do not have the password, please call the hospital operator.  06/19/2024, 5:37 PM

## 2024-06-20 DIAGNOSIS — Z89612 Acquired absence of left leg above knee: Secondary | ICD-10-CM | POA: Diagnosis not present

## 2024-06-20 LAB — CBC WITH DIFFERENTIAL/PLATELET
Abs Immature Granulocytes: 0.11 10*3/uL — ABNORMAL HIGH (ref 0.00–0.07)
Basophils Absolute: 0.1 10*3/uL (ref 0.0–0.1)
Basophils Relative: 1 %
Eosinophils Absolute: 0.1 10*3/uL (ref 0.0–0.5)
Eosinophils Relative: 1 %
HCT: 34 % — ABNORMAL LOW (ref 36.0–46.0)
Hemoglobin: 10.8 g/dL — ABNORMAL LOW (ref 12.0–15.0)
Immature Granulocytes: 1 %
Lymphocytes Relative: 8 %
Lymphs Abs: 1 10*3/uL (ref 0.7–4.0)
MCH: 28.3 pg (ref 26.0–34.0)
MCHC: 31.8 g/dL (ref 30.0–36.0)
MCV: 89.2 fL (ref 80.0–100.0)
Monocytes Absolute: 0.6 10*3/uL (ref 0.1–1.0)
Monocytes Relative: 5 %
Neutro Abs: 10 10*3/uL — ABNORMAL HIGH (ref 1.7–7.7)
Neutrophils Relative %: 84 %
Platelets: 232 10*3/uL (ref 150–400)
RBC: 3.81 MIL/uL — ABNORMAL LOW (ref 3.87–5.11)
RDW: 15.2 % (ref 11.5–15.5)
WBC: 11.9 10*3/uL — ABNORMAL HIGH (ref 4.0–10.5)
nRBC: 0 % (ref 0.0–0.2)

## 2024-06-20 LAB — COMPREHENSIVE METABOLIC PANEL WITH GFR
ALT: 6 U/L (ref 0–44)
AST: 15 U/L (ref 15–41)
Albumin: 3.2 g/dL — ABNORMAL LOW (ref 3.5–5.0)
Alkaline Phosphatase: 108 U/L (ref 38–126)
Anion gap: 11 (ref 5–15)
BUN: 60 mg/dL — ABNORMAL HIGH (ref 8–23)
CO2: 24 mmol/L (ref 22–32)
Calcium: 9.7 mg/dL (ref 8.9–10.3)
Chloride: 106 mmol/L (ref 98–111)
Creatinine, Ser: 1.64 mg/dL — ABNORMAL HIGH (ref 0.44–1.00)
GFR, Estimated: 32 mL/min — ABNORMAL LOW (ref >60)
Glucose, Bld: 126 mg/dL — ABNORMAL HIGH (ref 70–99)
Potassium: 3.3 mmol/L — ABNORMAL LOW (ref 3.5–5.1)
Sodium: 141 mmol/L (ref 135–145)
Total Bilirubin: 1 mg/dL (ref 0.0–1.2)
Total Protein: 6.6 g/dL (ref 6.5–8.1)

## 2024-06-20 LAB — GLUCOSE, CAPILLARY
Glucose-Capillary: 124 mg/dL — ABNORMAL HIGH (ref 70–99)
Glucose-Capillary: 207 mg/dL — ABNORMAL HIGH (ref 70–99)
Glucose-Capillary: 166 mg/dL — ABNORMAL HIGH (ref 70–99)
Glucose-Capillary: 117 mg/dL — ABNORMAL HIGH (ref 70–99)

## 2024-06-20 NOTE — Progress Notes (Signed)
 PROGRESS NOTE    Angela Hays  FMW:[id number] DOB: 1951/03/12 DOA: 06/17/2024 PCP: Pcp, No  No chief complaint on file.   Brief Narrative:   27F h/o HFpEF, HTN, HLD, DM2, CKD stage IV, HFpEF, CVA with residual left-sided weakness, prior R AKA, depression, and gout was seen by the hospitalist service POD 0 after her L AKA.  Assessment & Plan:   Principal Problem:   S/P AKA (above knee amputation) (HCC) Active Problems:   Toe ulcer, left, with unspecified severity (HCC)  Left AKA,POA: POD #1 -Vascular surgery following; apprec eval/recs - plan for take down of dressing on 6/27 -PT/OT consulted; apprec eval/recs; anticipate return to [facility] 6/27 -Multimodal pain control: PTA gabapentin  100mg  TID, Tylenol  1g TID sch, and oxycodone  5mg  q4h prn   Essential Hypertension: -PTA labetalol  and amlodipine  -lasix   -HOLD pta clonidine  and hydralazine  for now -BP ok at this time   Hyperlipidemia -PTA Zocor  20mg  daily   DM type 2 with peripheral neuropathy and peripheral vascular disease,POA: -PTA long acting insulin  15U daily (pta 15U in AM and 12U in PM for unclear reasons); consider consolidating the doses -SSI TID AC prn -gabapentin     S/p left inferior parathyroidectomy and excision of soft tissue mass from chest wall. Done on 6/16 by Dr Eletha for primary hyperparathyroidism   HFpEF/chronic diastolic CHF,POA: NYHA II. Not in exacerbation. -PTA labetalol  and PO lasix  40mg  BID   Depression - zoloft    Hx CVA  Plavix  on hold   CKD IV Unclear baseline    DVT prophylaxis: lovenox  Code Status: full Family Communication: none Disposition:   Status is: Inpatient Remains inpatient appropriate because: need for continued vascular care   Consultants:  vascular  Procedures:  Summary:  Right: AKA.    Left: Resting left ankle-brachial index indicates noncompressible left  lower extremity arteries.  Left toe index could not be obtained due to patient discomfort.    Antimicrobials:  Anti-infectives (From admission, onward)    Start     Dose/Rate Route Frequency Ordered Stop   06/17/24 0624  ceFAZolin  (ANCEF ) IVPB 2g/100 mL premix        2 g 200 mL/hr over 30 Minutes Intravenous 30 min pre-op 06/17/24 0624 06/17/24 0746       Subjective: No complaints, daughter at bedside  Objective: Vitals:   06/19/24 1627 06/19/24 1948 06/20/24 0422 06/20/24 0839  BP: (!) 125/54 (!) 155/68 (!) 154/73 (!) 161/74  Pulse: 75 77 77 81  Resp: 16 18 18 16   Temp: 98.8 F (37.1 C) 98.8 F (37.1 C) 98.9 F (37.2 C) 98.9 F (37.2 C)  TempSrc: Oral Oral Oral Oral  SpO2: 94% 95% 92% (!) 87%  Weight:      Height:        Intake/Output Summary (Last 24 hours) at 06/20/2024 1444 Last data filed at 06/20/2024 1200 Gross per 24 hour  Intake --  Output 1450 ml  Net -1450 ml   Filed Weights   06/17/24 0642  Weight: 74.8 kg    Examination:  General: No acute distress. Cardiovascular: RRR Lungs: unlabored Neurological: Alert and oriented 3. Moves all extremities 4 . Cranial nerves II through XII grossly intact. Skin: Warm and dry. No rashes or lesions. Extremities: bilateral AKA    Data Reviewed: I have personally reviewed following labs and imaging studies  CBC: Recent Labs  Lab 06/17/24 0630 06/19/24 0542 06/20/24 0836  WBC 9.5 8.8 11.9*  NEUTROABS  --  6.5 10.0*  HGB 12.1 9.5* 10.8*  HCT 40.0 30.4* 34.0*  MCV 93.7 90.5 89.2  PLT 280 221 232    Basic Metabolic Panel: Recent Labs  Lab 06/17/24 0630 06/19/24 0542 06/20/24 0836  NA 145 140 141  K 4.4 3.4* 3.3*  CL 105 107 106  CO2 21* 25 24  GLUCOSE 107* 105* 126*  BUN 57* 65* 60*  CREATININE 2.24* 2.10* 1.64*  CALCIUM  10.4* 9.7 9.7    GFR: Estimated Creatinine Clearance: 28.7 mL/min ([name]) (by C-G formula based on SCr of 1.64 mg/dL (H)).  Liver Function Tests: Recent Labs  Lab 06/17/24 0630 06/20/24 0836  AST 14* 15  ALT 8 6  ALKPHOS 121 108  BILITOT 0.8 1.0  PROT 7.0  6.6  ALBUMIN 3.6 3.2*    CBG: Recent Labs  Lab 06/19/24 1154 06/19/24 1628 06/19/24 1950 06/20/24 0840 06/20/24 1219  GLUCAP 204* 125* 100* 124* 207*     No results found for this or any previous visit (from the past 240 hours).        Radiology Studies: No results found.      Scheduled Meds:  acetaminophen   1,000 mg Oral TID   amLODipine   10 mg Oral q morning   cholecalciferol  1,000 Units Oral Daily   enoxaparin  (LOVENOX ) injection  30 mg Subcutaneous Q24H   furosemide   40 mg Oral BID   gabapentin   100 mg Oral TID   insulin  aspart  0-15 Units Subcutaneous TID WC   insulin  glargine-yfgn  15 Units Subcutaneous Daily   labetalol   100 mg Oral BID   sertraline   12.5 mg Oral Daily   simvastatin   20 mg Oral QHS   Continuous Infusions:   LOS: 3 days    Time spent: over 30 min    Meliton Monte, MD [facility]   To contact the attending provider between 7A-7P or the covering provider during after hours 7P-7A, please log into the web site [email or web address] and access using universal [place] password for that web site. If you do not have the password, please call the hospital operator.  06/20/2024, 2:44 PM

## 2024-06-20 NOTE — Progress Notes (Addendum)
  Progress Note    06/20/2024 7:27 AM 3 Days Post-Op  Subjective:  sleeping  Tm 98.9   Vitals:   06/19/24 1948 06/20/24 0422  BP: (!) 155/68 (!) 154/73  Pulse: 77 77  Resp: 18 18  Temp: 98.8 F (37.1 C) 98.9 F (37.2 C)  SpO2: 95% 92%    CBC    Component Value Date/Time   WBC 8.8 06/19/2024 0542   RBC 3.36 (L) 06/19/2024 0542   HGB 9.5 (L) 06/19/2024 0542   HCT 30.4 (L) 06/19/2024 0542   HCT 38.4 06/19/2019 0245   PLT 221 06/19/2024 0542   MCV 90.5 06/19/2024 0542   MCH 28.3 06/19/2024 0542   MCHC 31.3 06/19/2024 0542   RDW 15.3 06/19/2024 0542   LYMPHSABS 1.3 06/19/2024 0542   MONOABS 0.7 06/19/2024 0542   EOSABS 0.2 06/19/2024 0542   BASOSABS 0.1 06/19/2024 0542    BMET    Component Value Date/Time   NA 140 06/19/2024 0542   K 3.4 (L) 06/19/2024 0542   CL 107 06/19/2024 0542   CO2 25 06/19/2024 0542   GLUCOSE 105 (H) 06/19/2024 0542   BUN 65 (H) 06/19/2024 0542   CREATININE 2.10 (H) 06/19/2024 0542   CALCIUM  9.7 06/19/2024 0542   GFRNONAA 24 (L) 06/19/2024 0542   GFRAA 36 (L) 05/08/2020 0642    INR    Component Value Date/Time   INR 1.1 06/17/2024 0630     Intake/Output Summary (Last 24 hours) at 06/20/2024 0727 Last data filed at 06/20/2024 0428 Gross per 24 hour  Intake --  Output 1600 ml  Net -1600 ml     Assessment/Plan:  73 y.o. female is s/p left above knee amputation   3 Days Post-Op   -pt sleeping and I did not wake her -will take down dressing tomorrow morning.    Lucie Apt, PA-C [facility] [phone] 06/20/2024 7:27 AM   VASCULAR STAFF ADDENDUM: Dressing take down tomorrow AM. Can restart DOAC tomorrow as well.   Fonda FORBES Rim MD [facility] Phone Number: [phone] 06/20/2024 8:18 AM

## 2024-06-20 NOTE — Plan of Care (Signed)
  Problem: Clinical Measurements: Goal: Will remain free from infection Outcome: Progressing   Problem: Clinical Measurements: Goal: Respiratory complications will improve Outcome: Progressing   Problem: Coping: Goal: Level of anxiety will decrease Outcome: Progressing   Problem: Nutrition: Goal: Adequate nutrition will be maintained Outcome: Progressing   Problem: Elimination: Goal: Will not experience complications related to bowel motility Outcome: Progressing

## 2024-06-20 NOTE — Care Management Important Message (Signed)
 Important Message  Patient Details  Name: INESHA SOW MRN: [id number] Date of Birth: [birth year]   Important Message Given:  Yes - Medicare IM     Jon Cruel 06/20/2024, 2:02 PM

## 2024-06-20 NOTE — TOC CM/SW Note (Signed)
 Transition of Care [facility]) - Inpatient Brief Assessment   Patient Details  Name: Angela Hays MRN: [id number] Date of Birth: 08-15-1951  Transition of Care [facility]) CM/SW Contact:    Almarie CHRISTELLA Goodie, LCSW Phone Number: 06/20/2024, 8:15 PM   Clinical Narrative:   Patient is LTC resident at [facility], can return when stable. CSW to follow.    Transition of Care Asessment: Insurance and Status: Insurance coverage has been reviewed Patient has primary care physician: Yes Home environment has been reviewed: LTC SNF Resident Prior level of function:: Bedbound Prior/Current Home Services: No current home services Social Drivers of Health Review: SDOH reviewed no interventions necessary Readmission risk has been reviewed: Yes Transition of care needs: transition of care needs identified, TOC will continue to follow

## 2024-06-21 DIAGNOSIS — L97529 Non-pressure chronic ulcer of other part of left foot with unspecified severity: Secondary | ICD-10-CM | POA: Diagnosis not present

## 2024-06-21 LAB — GLUCOSE, CAPILLARY
Glucose-Capillary: 128 mg/dL — ABNORMAL HIGH (ref 70–99)
Glucose-Capillary: 205 mg/dL — ABNORMAL HIGH (ref 70–99)
Glucose-Capillary: 116 mg/dL — ABNORMAL HIGH (ref 70–99)
Glucose-Capillary: 148 mg/dL — ABNORMAL HIGH (ref 70–99)

## 2024-06-21 MED ORDER — CLOPIDOGREL BISULFATE 75 MG PO TABS
75.0000 mg | ORAL_TABLET | Freq: Every day | ORAL | Status: DC
  Administered 2024-06-21: 75 mg via ORAL
  Filled 2024-06-21: qty 1

## 2024-06-21 MED ORDER — LYUMJEV KWIKPEN 100 UNIT/ML ~~LOC~~ SOPN: 0.0000 [IU] | PEN_INJECTOR | SUBCUTANEOUS | Status: AC

## 2024-06-21 MED ORDER — LANTUS SOLOSTAR 100 UNIT/ML ~~LOC~~ SOPN: 15.0000 [IU] | PEN_INJECTOR | Freq: Every day | SUBCUTANEOUS | Status: AC

## 2024-06-21 NOTE — TOC Transition Note (Signed)
 Transition of Care [facility]) - Discharge Note   Patient Details  Name: Angela Hays MRN: [id number] Date of Birth: November 19, 1951  Transition of Care [facility]) CM/SW Contact:  Almarie CHRISTELLA Goodie, LCSW Phone Number: 06/21/2024, 2:55 PM   Clinical Narrative:   CSW updated by MD that patient is stable to return to LTC, [facility] can accept. CSW sent discharge information to [facility], confirmed receipt. Transport arranged with [facility] for next available.  Nurse to call report to [phone].    Final next level of care: Skilled Nursing Facility Barriers to Discharge: Barriers Resolved   Patient Goals and CMS Choice            Discharge Placement              Patient chooses bed at: [facility] Patient to be transferred to facility by: [facility] Name of family member notified: Self Patient and family notified of of transfer: 06/21/24  Discharge Plan and Services Additional resources added to the After Visit Summary for                                       Social Drivers of Health (SDOH) Interventions SDOH Screenings   Food Insecurity: No Food Insecurity (06/17/2024)  Housing: Low Risk  (06/17/2024)  Transportation Needs: No Transportation Needs (06/17/2024)  Utilities: Not At Risk (06/17/2024)  Social Connections: Unknown (06/17/2024)  Tobacco Use: Medium Risk (06/17/2024)     Readmission Risk Interventions     No data to display

## 2024-06-21 NOTE — Progress Notes (Addendum)
  Progress Note    06/21/2024 6:37 AM 4 Days Post-Op  Subjective:  sleeping and wakes easily  Afebrile  Vitals:   06/20/24 2142 06/21/24 0433  BP: (!) 141/76 (!) 149/69  Pulse: 65 79  Resp: 18 18  Temp: 98.1 F (36.7 C) 98.6 F (37 C)  SpO2: 92% 93%    Physical Exam: Incisions:  clean and dry with staples in tact    CBC    Component Value Date/Time   WBC 11.9 (H) 06/20/2024 0836   RBC 3.81 (L) 06/20/2024 0836   HGB 10.8 (L) 06/20/2024 0836   HCT 34.0 (L) 06/20/2024 0836   HCT 38.4 06/19/2019 0245   PLT 232 06/20/2024 0836   MCV 89.2 06/20/2024 0836   MCH 28.3 06/20/2024 0836   MCHC 31.8 06/20/2024 0836   RDW 15.2 06/20/2024 0836   LYMPHSABS 1.0 06/20/2024 0836   MONOABS 0.6 06/20/2024 0836   EOSABS 0.1 06/20/2024 0836   BASOSABS 0.1 06/20/2024 0836    BMET    Component Value Date/Time   NA 141 06/20/2024 0836   K 3.3 (L) 06/20/2024 0836   CL 106 06/20/2024 0836   CO2 24 06/20/2024 0836   GLUCOSE 126 (H) 06/20/2024 0836   BUN 60 (H) 06/20/2024 0836   CREATININE 1.64 (H) 06/20/2024 0836   CALCIUM  9.7 06/20/2024 0836   GFRNONAA 32 (L) 06/20/2024 0836   GFRAA 36 (L) 05/08/2020 0642    INR    Component Value Date/Time   INR 1.1 06/17/2024 0630     Intake/Output Summary (Last 24 hours) at 06/21/2024 9362 Last data filed at 06/20/2024 1619 Gross per 24 hour  Intake 240 ml  Output 550 ml  Net -310 ml     Assessment/Plan:  73 y.o. female is s/p left above knee amputation   4 Days Post-Op   -dressing removed today and looks good.  Will order retention sock  -clean daily with soap and water  and pat dry and then apply retention sock.  -ok to discharge to facility from vascular standpoint.  -f/u in 4-5 weeks for staple removal-our office will arrange appt.     Lucie Apt, PA-C [facility] [phone] 06/21/2024 6:37 AM    VASCULAR STAFF ADDENDUM: I have independently interviewed and examined the patient. I agree  with the above.  Recommend tegaderm dressing daily to prevent stooling on the wound.  Currently healing nicely  Fonda FORBES Rim MD [facility] Phone Number: [phone] 06/21/2024 10:55 AM

## 2024-06-21 NOTE — Plan of Care (Signed)
  Problem: Coping: Goal: Level of anxiety will decrease Outcome: Progressing   Problem: Nutrition: Goal: Adequate nutrition will be maintained Outcome: Progressing   Problem: Activity: Goal: Risk for activity intolerance will decrease Outcome: Progressing   Problem: Elimination: Goal: Will not experience complications related to bowel motility Outcome: Progressing

## 2024-06-21 NOTE — Progress Notes (Signed)
 Occupational Therapy Treatment Patient Details Name: Angela Hays MRN: [id number] DOB: 07/03/1951 Today's Date: 06/21/2024   History of present illness The pt is a 73 y.o. female who is s/p L AKA. PMHx: HFpEF, HTN, HLD, DM2, CKD stage IV, HFpEF, CVA with residual left-sided weakness, prior R AKA, depression, and gout.   OT comments  OT session focused on UB ADLs and therapeutic B UE exercises to increase activity tolerance, UE strength, and sitting balance for carryover to functional tasks and to prevent further contraction/maintain joint integrity in L UE. Pt currently demonstrating ability to complete UB ADLs largely Independent to Min assist from bed level as pt is currently requiring up to Max assist to maintain dynamic sitting balance. Pt also demonstrating ability to complete bed mobility during/in preparation for functional and therapeutic tasks with Mod assist of +1 to +2 this session. Pt participated well in session and is motivated to continue with skilled rehab services. OT goals updated this day to reflect pt current functional level. Pt will benefit from continued acute skilled OT services to address deficits outlined below and increase safety and independence with functional tasks. Post acute discharge, pt will benefit from intensive inpatient skilled rehab services < 3 hours per day to maximize rehab potential.       If plan is discharge home, recommend the following:  Two people to help with walking and/or transfers;A lot of help with bathing/dressing/bathroom;Assistance with cooking/housework;Direct supervision/assist for medications management;Assist for transportation;Help with stairs or ramp for entrance;Direct supervision/assist for financial management   Equipment Recommendations  None recommended by OT (Pt already has needed equipment. Pt expresses desire to have a power w/c.)    Recommendations for Other Services      Precautions / Restrictions Precautions Precautions:  Fall Precaution/Restrictions Comments: bilateral AKA without prosthetics Restrictions Weight Bearing Restrictions Per Provider Order: No       Mobility Bed Mobility Overal bed mobility: Needs Assistance Bed Mobility: Rolling, Sidelying to Sit, Sit to Sidelying Rolling: Mod assist, Used rails Sidelying to sit: Mod assist, HOB elevated, Used rails     Sit to sidelying: Mod assist, +2 for safety/equipment General bed mobility comments: modA to roll, pt able to use UE to initiate movement but unable to complete without assist at hips to complete trunk movement. assist at trunk to elevate and lower trunk to sitting    Transfers                   General transfer comment: not addressed in this session to allow for focus on seated balance and exercises. hoyer lift at baseline     Balance Overall balance assessment: Needs assistance Sitting-balance support: Single extremity supported, Feet unsupported Sitting balance-Leahy Scale: Poor Sitting balance - Comments: minA initially, progressed to mod-maxA with fatigue. R lateral and posterior lean when not supported Postural control: Posterior lean, Right lateral lean     Standing balance comment: unable                           ADL either performed or assessed with clinical judgement   ADL Overall ADL's : Needs assistance/impaired Eating/Feeding: Independent;Bed level   Grooming: Set up;Bed level           Upper Body Dressing : Minimal assistance;Bed level                          Extremity/Trunk Assessment Upper Extremity Assessment Upper  Extremity Assessment: Right hand dominant;Generalized weakness;LUE deficits/detail;RUE deficits/detail RUE Deficits / Details: generalized weakness; mildly decreased fine motor coordination RUE Coordination: decreased fine motor (mild) LUE Deficits / Details: AROM shoulder flexion to approx. 40 degrees and PROM shoulder flexion to approx. 90 degress; AROM and  PROM elbow extension to approx. 90 degrees; generalized weakness; decreased fine and gross motor coordination LUE Coordination: decreased fine motor;decreased gross motor   Lower Extremity Assessment Lower Extremity Assessment: Defer to PT evaluation        Vision       Perception     Praxis     Communication Communication Communication: No apparent difficulties   Cognition Arousal: Alert Behavior During Therapy: WFL for tasks assessed/performed Cognition: Cognition impaired, No family/caregiver present to determine baseline     Awareness: Intellectual awareness intact, Online awareness intact Memory impairment (select all impairments): Short-term memory, Working memory Attention impairment (select first level of impairment): Alternating attention Executive functioning impairment (select all impairments): Problem solving, Organization OT - Cognition Comments: Pt AAOx4 with cognition largely [facility] for tasks assessed but with deficits noted above. OT suspects pt is at or very near her baseline cognitive level.                 Following commands: Intact        Cueing   Cueing Techniques: Verbal cues, Tactile cues, Visual cues  Exercises Exercises: General Upper Extremity General Exercises - Upper Extremity Shoulder Flexion: AROM, Right, 10 reps, Strengthening, Other (comment), Seated (increased sitting balance; sitting EOB with assist to maintain balance) Shoulder Horizontal ABduction: AROM, Right, 10 reps, Seated, Strengthening (increased sitting balance; sitting EOB with assist to maintain balance) Shoulder Horizontal ADduction: AROM, Right, 10 reps, Seated, Strengthening (increased sitting balance; sitting EOB with assist to maintain balance) Other Exercises Other Exercises: seated forwards and cross-body punches with RUE, min-modA for balance. poor core activation. x10 forwards, x10 cross-body to L and x10 to R Other Exercises: supine cross-body reaches x 10 each  direction    Shoulder Instructions       General Comments VSS on RA throughout session    Pertinent Vitals/ Pain       Pain Assessment Pain Assessment: No/denies pain Pain Intervention(s): Monitored during session  Home Living                                          Prior Functioning/Environment              Frequency  Min 1X/week        Progress Toward Goals  OT Goals(current goals can now be found in the care plan section)  Progress towards OT goals: Progressing toward goals  ADL Goals Pt Will Perform Grooming: with supervision;with contact guard assist;sitting (sitting EOB for 3 or more minutes with Fair balance) Pt/caregiver will Perform Home Exercise Program: Increased strength;Both right and left upper extremity;With Supervision;With written HEP provided (AROM on Left; AROM progressing to theraband on Right; for increased activity tolerance; to prevent further contraction and maintain joint integrity on Left)  Plan      Co-evaluation    PT/OT/SLP Co-Evaluation/Treatment: Yes Reason for Co-Treatment: For patient/therapist safety;Complexity of the patient's impairments (multi-system involvement) PT goals addressed during session: Mobility/safety with mobility;Balance;Strengthening/ROM OT goals addressed during session: ADL's and self-care;Strengthening/ROM      AM-PAC OT 6 Clicks Daily Activity     Outcome Measure  Help from another person eating meals?: None Help from another person taking care of personal grooming?: A Little Help from another person toileting, which includes using toliet, bedpan, or urinal?: Total Help from another person bathing (including washing, rinsing, drying)?: A Lot Help from another person to put on and taking off regular upper body clothing?: A Little Help from another person to put on and taking off regular lower body clothing?: Total 6 Click Score: 14    End of Session    OT Visit Diagnosis: Other  abnormalities of gait and mobility (R26.89);Muscle weakness (generalized) (M62.81)   Activity Tolerance Patient tolerated treatment well   Patient Left in bed;with call bell/phone within reach;with bed alarm set   Nurse Communication Mobility status        Time: 8880-8851 OT Time Calculation (min): 29 min  Charges: OT General Charges $OT Visit: 1 Visit OT Treatments $Self Care/Home Management : 8-22 mins  Margarie Rockey HERO., OTR/L, MA Acute Rehab [phone]   Margarie FORBES Horns 06/21/2024, 12:48 PM

## 2024-06-21 NOTE — Progress Notes (Signed)
 Patient discharge to [facility]. Patient belongings with patient at bedside. [facility] to transport patient to nursing home facility.

## 2024-06-21 NOTE — Progress Notes (Signed)
 Called report to [facility] [phone]. Spoke with nurse patricia who will be taking care of  the patient there once she arrives back. Nurse verbalized understanding of report and had no further questions.

## 2024-06-21 NOTE — Progress Notes (Signed)
 Physical Therapy Treatment Patient Details Name: Angela Hays MRN: [id number] DOB: May 07, 1951 Today's Date: 06/21/2024   History of Present Illness The pt is a 73 y.o. female who is s/p L AKA. PMHx: HFpEF, HTN, HLD, DM2, CKD stage IV, HFpEF, CVA with residual left-sided weakness, prior R AKA, depression, and gout.    PT Comments  Pt agreeable to session focused on progressing mobility and core strength. Pt able to complete LLE exercises and hip ROM without change in pain, demos limited hip extension ROM and poor activation of L hip abd. Pt required modA of 2 to complete transition to sitting EOB, and then from minA to maxA to maintain seated balance as she fatigued. Pt completed various reaching exercises in attempt to improve core activation, strength, and UE strength to improve capacity for seated balance and self-propulsion of WC. Pt tolerated mobility very well, reports no pain, hopeful to return to facility for continued rehab soon.     If plan is discharge home, recommend the following: Two people to help with walking and/or transfers;A lot of help with bathing/dressing/bathroom;Assistance with cooking/housework;Assistance with feeding;Help with stairs or ramp for entrance;Assist for transportation   Can travel by private vehicle     No  Equipment Recommendations  None recommended by PT (already has [employer] lift; pt inquiring about custom WC. May need new WC upon returning to SNF)    Recommendations for Other Services       Precautions / Restrictions Precautions Precautions: Fall Precaution/Restrictions Comments: bilateral AKA without prosthetics Restrictions Weight Bearing Restrictions Per Provider Order: No     Mobility  Bed Mobility Overal bed mobility: Needs Assistance Bed Mobility: Rolling, Sidelying to Sit, Sit to Sidelying Rolling: Mod assist, Used rails Sidelying to sit: Mod assist, HOB elevated, Used rails     Sit to sidelying: Mod assist, +2 for  safety/equipment General bed mobility comments: modA to roll, pt able to use UE to initiate movement but unable to complete without assist athips to complete trunk movement. assist at trunk to elevate and lower trunk to sitting    Transfers                   General transfer comment: not addressed in this session to allow for focus on seated balance and exercises. hoyer lift at baseline    Ambulation/Gait                   Stairs             Wheelchair Mobility     Tilt Bed    Modified Rankin (Stroke Patients Only)       Balance Overall balance assessment: Needs assistance Sitting-balance support: Single extremity supported, Feet unsupported Sitting balance-Leahy Scale: Poor Sitting balance - Comments: minA initially, progressed to mod-maxA with fatigue. R lateral and posterior lean when not supported Postural control: Posterior lean, Right lateral lean     Standing balance comment: unable                            Communication Communication Communication: No apparent difficulties  Cognition Arousal: Alert Behavior During Therapy: WFL for tasks assessed/performed   PT - Cognitive impairments: No apparent impairments                       PT - Cognition Comments: followed all commands in session and appropriate throughout. not formally assessed but not impairments noted Following  commands: Intact      Cueing Cueing Techniques: Verbal cues, Tactile cues  Exercises General Exercises - Upper Extremity Shoulder Flexion: AROM, Right, 10 reps Shoulder Horizontal ABduction: AROM, Right, 10 reps, Seated Shoulder Horizontal ADduction: AROM, Right, 10 reps, Seated Amputee Exercises Hip Extension: AROM, Left, 10 reps, Sidelying Hip ABduction/ADduction: AROM, Left, 10 reps, Supine Other Exercises Other Exercises: seated forwards and cross-body punches with RUE, min-modA for balance. poor core activation. x10 forwards, x10  cross-body to L and x10 to R Other Exercises: supine cross-body reaches x 10 each direction    General Comments General comments (skin integrity, edema, etc.): VSS      Pertinent Vitals/Pain Pain Assessment Pain Assessment: No/denies pain Pain Intervention(s): Monitored during session     PT Goals (current goals can now be found in the care plan section) Acute Rehab PT Goals Patient Stated Goal: to return home to facility PT Goal Formulation: With patient Time For Goal Achievement: 07/02/24 Potential to Achieve Goals: Fair Progress towards PT goals: Progressing toward goals    Frequency    Min 1X/week      PT Plan      Co-evaluation   Reason for Co-Treatment: For patient/therapist safety;Complexity of the patient's impairments (multi-system involvement) PT goals addressed during session: Mobility/safety with mobility;Balance;Strengthening/ROM OT goals addressed during session: ADL's and self-care      AM-PAC PT 6 Clicks Mobility   Outcome Measure  Help needed turning from your back to your side while in a flat bed without using bedrails?: A Lot Help needed moving from lying on your back to sitting on the side of a flat bed without using bedrails?: Total Help needed moving to and from a bed to a chair (including a wheelchair)?: Total Help needed standing up from a chair using your arms (e.g., wheelchair or bedside chair)?: Total Help needed to walk in hospital room?: Total Help needed climbing 3-5 steps with a railing? : Total 6 Click Score: 7    End of Session   Activity Tolerance: Patient tolerated treatment well Patient left: in bed;with call bell/phone within reach;with bed alarm set;with nursing/sitter in room Nurse Communication: Need for lift equipment PT Visit Diagnosis: Muscle weakness (generalized) (M62.81);Other abnormalities of gait and mobility (R26.89)     Time: 8880-8851 PT Time Calculation (min) (ACUTE ONLY): 29 min  Charges:     $Therapeutic Activity: 8-22 mins PT General Charges $$ ACUTE PT VISIT: 1 Visit                     Izetta Call, PT, DPT   Acute Rehabilitation Department Office [phone] Secure Chat Communication Preferred   Izetta JULIANNA Call 06/21/2024, 12:15 PM

## 2024-06-21 NOTE — Discharge Summary (Signed)
 Physician Discharge Summary  Angela Hays FMW:[id number] DOB: 02-Mar-1951 DOA: 06/17/2024  PCP: Pcp, No  Admit date: 06/17/2024 Discharge date: 06/21/2024  Time spent: 40 minutes  Recommendations for Outpatient Follow-up:  Follow outpatient CBC/CMP  Follow with vascular in 4-5 weeks for staple removal Tegaderm daily to prevent stool on wound.   Follow BP outpatient, holding clonidine  on discharge - follow outpatient to determine whether to resume Basal insulin  reduced to 15 units daily - adjust diabetes regimen as needed  Discharge Diagnoses:  Principal Problem:   S/P AKA (above knee amputation) (HCC) Active Problems:   Toe ulcer, left, with unspecified severity [facility])   Discharge Condition: stable  Diet recommendation: heart healthy  Filed Weights   06/17/24 0642  Weight: 74.8 kg    History of present illness:   23F h/o HFpEF, HTN, HLD, DM2, CKD stage IV, HFpEF, CVA with residual left-sided weakness, prior R AKA, depression, and gout was seen by the hospitalist service POD 0 after her L AKA.   She's done well post op.  Stable for discharge 6/27.    Hospital Course:  Assessment and Plan:  Left AKA 6/23:  -Vascular surgery following; apprec eval/recs -> recommending retention sock, follow up in 4-5 weeks for staple removal, tegaderm daily to prevent stool on wound -pain well controlled - stable for discharge today   Essential Hypertension: -labetalol  and amlodipine  -lasix   -resume hydralazine  at discharge -follow BP off clonidine , hasn't received this while admitted, decide whether to resume outpatient    Hyperlipidemia -PTA Zocor  20mg  daily   DM type 2 with peripheral neuropathy and peripheral vascular disease,POA: -continue SSI at discharge.  Reducing basal insulin  to 15 units daily.   - follow and adjust as needed -gabapentin     S/p left inferior parathyroidectomy and excision of soft tissue mass from chest wall. Done on 6/16 by Dr Eletha for primary  hyperparathyroidism   HFpEF/chronic diastolic CHF,POA: NYHA II. Not in exacerbation. -labetalol  and PO lasix  40mg  BID   Depression - zoloft     Hx CVA  Resuming plavix  at discharge  CKD IV 1.6 on discharge, monitor      Procedures: Left above-knee amputation    Consultations: vascular  Discharge Exam: Vitals:   06/21/24 0815 06/21/24 0857  BP: (!) 147/65 (!) 147/65  Pulse: 84 84  Resp: 16   Temp: 97.6 F (36.4 C)   SpO2: 98%    Eager to discharge No complaints  General: No acute distress. Cardiovascular: RRR Lungs: unlabored Abdomen: Soft, nontender, nondistended  Neurological: Alert and oriented 3. Moves all extremities 4 . Cranial nerves II through XII grossly intact. Extremities: bilateral AKA, L AKA with staples, well appearing stump  Discharge Instructions   Discharge Instructions     Call MD for:  difficulty breathing, headache or visual disturbances   Complete by: As directed    Call MD for:  extreme fatigue   Complete by: As directed    Call MD for:  hives   Complete by: As directed    Call MD for:  persistant dizziness or light-headedness   Complete by: As directed    Call MD for:  persistant nausea and vomiting   Complete by: As directed    Call MD for:  redness, tenderness, or signs of infection (pain, swelling, redness, odor or green/yellow discharge around incision site)   Complete by: As directed    Call MD for:  severe uncontrolled pain   Complete by: As directed    Call MD for:  temperature >100.4   Complete by: As directed    Diet - low sodium heart healthy   Complete by: As directed    Discharge instructions   Complete by: As directed    You were seen after an above the knee amputation.  You've recovered well post operatively.    You're stable to follow outpatient.  We'll get you back to your facility today.  You should follow up with vascular surgery in about 4-5 weeks to get the staples out.   We're holding your clonidine   at discharge as this had not been given while you were here.  Follow with your outpatient providers for when this should be resumed.  We hadn't been giving hydralazine , but will resume this on discharge.  Continue your other blood pressure meds.  We reduced your basal insulin  to 15 units daily.  Follow your blood sugars on your home regimen.  Your PCP should continue to adjust the regimen as needed.  Return for new, recurrent, or worsening symptoms.  Please ask your PCP to request records from this hospitalization so they know what was done and what the next steps will be.   Increase activity slowly   Complete by: As directed    No wound care   Complete by: As directed       Allergies as of 06/21/2024   No Known Allergies      Medication List     PAUSE taking these medications    cloNIDine  0.1 MG tablet Wait to take this until your doctor or other care provider tells you to start again. Follow blood pressure outpatient with PCP, resume if needed Commonly known as: CATAPRES  Take 1 tablet (0.1 mg total) by mouth 2 (two) times daily.       TAKE these medications    amLODipine  10 MG tablet Commonly known as: NORVASC  Take 1 tablet (10 mg total) by mouth every morning.   cholecalciferol  25 MCG (1000 UNIT) tablet Commonly known as: VITAMIN D3 Take 1,000 Units by mouth daily.   clopidogrel  75 MG tablet Commonly known as: PLAVIX  Take 75 mg by mouth daily.   furosemide  40 MG tablet Commonly known as: LASIX  Take 40 mg by mouth 2 (two) times daily.   gabapentin  100 MG capsule Commonly known as: NEURONTIN  Take 100 mg by mouth 3 (three) times daily.   hydrALAZINE  25 MG tablet Commonly known as: APRESOLINE  Take 1 tablet (25 mg total) by mouth 3 (three) times daily.   labetalol  100 MG tablet Commonly known as: NORMODYNE  Take 100 mg by mouth 2 (two) times daily.   Lantus  SoloStar 100 UNIT/ML Solostar Pen Generic drug: insulin  glargine Inject 15 Units into the skin  daily. Follow with outpatient provider to adjust as needed What changed:  how much to take when to take this additional instructions   Lyumjev KwikPen 100 UNIT/ML KwikPen Generic drug: Insulin  Lispro-aabc Inject 0-14 Units into the skin See admin instructions. Subcutaneously before meals and at bedtime CBG 70 - 120: 0 units CBG 121 - 150: 3 units CBG 151 - 200: 4 units CBG 201 - 250: 6 units CBG 251 - 300: 8 units CBG 301 - 350: 10 units CBG 351 - 400: 12 units CBG 401 - 999: 14 units Call MD for BS>400,   potassium chloride  10 MEQ CR capsule Commonly known as: MICRO-K  Take 20 mEq by mouth daily.   senna 8.6 MG Tabs tablet Commonly known as: SENOKOT Take 2 tablets by mouth in the morning and  at bedtime.   sertraline  25 MG tablet Commonly known as: ZOLOFT  Take 12.5 mg by mouth daily.   simvastatin  20 MG tablet Commonly known as: ZOCOR  Take 20 mg by mouth at bedtime.   sitaGLIPtin  50 MG tablet Commonly known as: JANUVIA  Take 50 mg by mouth daily.       No Known Allergies  Follow-up Information     [facility] [place] Follow up in 5 week(s).   Specialty: Vascular Surgery Why: Office will call you to arrange your appt (sent). Contact information: [address] [place]  [address] [phone]                 The results of significant diagnostics from this hospitalization (including imaging, microbiology, ancillary and laboratory) are listed below for reference.    Significant Diagnostic Studies: VAS US  ABI WITH/WO TBI Result Date: 06/13/2024  LOWER EXTREMITY DOPPLER STUDY Patient Name:  SUEANNE MANIACI  Date of Exam:   06/13/2024 Medical Rec #: [id number]       Accession #:    [id number] Date of Birth: [birth year]       Patient Gender: F Patient Age:   73 years Exam Location:  [address] Procedure:      VAS US  ABI WITH/WO TBI Referring Phys: JOSHUA ROBINS  --------------------------------------------------------------------------------  Indications: Ulcer of left great toe with hx of right AKA 10/31/2022  Performing Technologist: Geni Lodge RVS, RCS  Examination Guidelines: Recardo Linn complete evaluation includes at minimum, Doppler waveform signals and systolic blood pressure reading at the level of bilateral brachial, anterior tibial, and posterior tibial arteries, when vessel segments are accessible. Bilateral testing is considered an integral part of Linville Decarolis complete examination. Photoelectric Plethysmograph (PPG) waveforms and toe systolic pressure readings are included as required and additional duplex testing as needed. Limited examinations for reoccurring indications may be performed as noted.  ABI Findings: +--------+------------------+-----+--------+--------+ Right   Rt Pressure (mmHg)IndexWaveformComment  +--------+------------------+-----+--------+--------+ [id number]                                     +--------+------------------+-----+--------+--------+ +----+------------------+-----+----------+-------+ LeftLt Pressure (mmHg)IndexWaveform  Comment +----+------------------+-----+----------+-------+ PTA                        monophasic        +----+------------------+-----+----------+-------+ DP  187               0.97 monophasic        +----+------------------+-----+----------+-------+ +-------+-----------+----------------+------------+------------+ ABI/TBIToday's ABIToday's TBI     Previous ABIPrevious TBI +-------+-----------+----------------+------------+------------+ Right  AKA        AKA                                      +-------+-----------+----------------+------------+------------+ Left   Anchor Point         unable to obtain                         +-------+-----------+----------------+------------+------------+   Summary: Right: AKA. Left: Resting left ankle-brachial index indicates noncompressible left lower  extremity arteries. Left toe index could not be obtained due to patient discomfort. *See table(s) above for measurements and observations.  Electronically signed by Fonda Rim on 06/13/2024 at 4:22:15 PM.  Final     Microbiology: No results found for this or any previous visit (from the past 240 hours).   Labs: Basic Metabolic Panel: Recent Labs  Lab 06/17/24 0630 06/19/24 0542 06/20/24 0836  NA 145 140 141  K 4.4 3.4* 3.3*  CL 105 107 106  CO2 21* 25 24  GLUCOSE 107* 105* 126*  BUN 57* 65* 60*  CREATININE 2.24* 2.10* 1.64*  CALCIUM  10.4* 9.7 9.7   Liver Function Tests: Recent Labs  Lab 06/17/24 0630 06/20/24 0836  AST 14* 15  ALT 8 6  ALKPHOS 121 108  BILITOT 0.8 1.0  PROT 7.0 6.6  ALBUMIN 3.6 3.2*   No results for input(s): LIPASE, AMYLASE in the last 168 hours. No results for input(s): AMMONIA in the last 168 hours. CBC: Recent Labs  Lab 06/17/24 0630 06/19/24 0542 06/20/24 0836  WBC 9.5 8.8 11.9*  NEUTROABS  --  6.5 10.0*  HGB 12.1 9.5* 10.8*  HCT 40.0 30.4* 34.0*  MCV 93.7 90.5 89.2  PLT 280 221 232   Cardiac Enzymes: No results for input(s): CKTOTAL, CKMB, CKMBINDEX, TROPONINI in the last 168 hours. BNP: BNP (last 3 results) No results for input(s): BNP in the last 8760 hours.  ProBNP (last 3 results) No results for input(s): PROBNP in the last 8760 hours.  CBG: Recent Labs  Lab 06/20/24 1219 06/20/24 1617 06/20/24 2144 06/21/24 0822 06/21/24 1233  GLUCAP 207* 166* 117* 128* 205*       Signed:  Meliton Monte MD.  [facility] 06/21/2024, 12:50 PM

## 2024-07-18 ENCOUNTER — Ambulatory Visit: Attending: Vascular Surgery | Admitting: Physician Assistant

## 2024-07-18 VITALS — BP 138/60 | HR 64 | Temp 97.7°F | Wt 165.0 lb

## 2024-07-18 DIAGNOSIS — Z89612 Acquired absence of left leg above knee: Secondary | ICD-10-CM

## 2024-07-18 NOTE — Progress Notes (Signed)
 POST OPERATIVE OFFICE NOTE    CC:  F/u for surgery  HPI:  This is a 73 y.o. female who is s/p left AKA on 06/17/2024 by Dr. Lanis.  He has hx of right AKA 10/31/2022 also by Dr. Lanis.  She has hx of right AKA on 11/17/2022 also by Dr. Lanis.   Pt returns today for follow up and here with her daughter.  Pt states she feels wonderful.  She is not having any pain and her incision is healed.  She states they are cleaning it daily.    No Known Allergies  Current Outpatient Medications  Medication Sig Dispense Refill   amLODipine  (NORVASC ) 10 MG tablet Take 1 tablet (10 mg total) by mouth every morning.     cholecalciferol  (VITAMIN D3) 25 MCG (1000 UNIT) tablet Take 1,000 Units by mouth daily.     [Paused] cloNIDine  (CATAPRES ) 0.1 MG tablet Take 1 tablet (0.1 mg total) by mouth 2 (two) times daily.     clopidogrel  (PLAVIX ) 75 MG tablet Take 75 mg by mouth daily. (Patient not taking: Reported on 06/06/2024)     furosemide  (LASIX ) 40 MG tablet Take 40 mg by mouth 2 (two) times daily.     gabapentin  (NEURONTIN ) 100 MG capsule Take 100 mg by mouth 3 (three) times daily.     hydrALAZINE  (APRESOLINE ) 25 MG tablet Take 1 tablet (25 mg total) by mouth 3 (three) times daily. 90 tablet 0   Insulin  Lispro-aabc (LYUMJEV  KWIKPEN) 100 UNIT/ML KwikPen Inject 0-14 Units into the skin See admin instructions. Subcutaneously before meals  CBG 70 - 120: 0 units CBG 121 - 150: 3 units CBG 151 - 200: 4 units CBG 201 - 250: 6 units CBG 251 - 300: 8 units CBG 301 - 350: 10 units CBG 351 - 400: 12 units CBG 401 - 999: 14 units Call MD for BS>400,     labetalol  (NORMODYNE ) 100 MG tablet Take 100 mg by mouth 2 (two) times daily.  0   LANTUS  SOLOSTAR 100 UNIT/ML Solostar Pen Inject 15 Units into the skin daily. Follow with outpatient provider to adjust as needed     potassium chloride  (MICRO-K ) 10 MEQ CR capsule Take 20 mEq by mouth daily.     senna (SENOKOT) 8.6 MG TABS tablet Take 2 tablets by mouth in the  morning and at bedtime.     sertraline  (ZOLOFT ) 25 MG tablet Take 12.5 mg by mouth daily.     simvastatin  (ZOCOR ) 20 MG tablet Take 20 mg by mouth at bedtime.     sitaGLIPtin  (JANUVIA ) 50 MG tablet Take 50 mg by mouth daily.     No current facility-administered medications for this visit.     ROS:  See HPI  Physical Exam:  Today's Vitals   07/18/24 0853  BP: 138/60  Pulse: 64  Temp: 97.7 F (36.5 C)  TempSrc: Temporal  Weight: 165 lb (74.8 kg)  PainSc: 0-No pain   Body mass index is 29.23 kg/m.   Incision:  left AKA incision healed nicely and has staples in tact.  No swelling      Assessment/Plan:  This is a 73 y.o. female who is s/p: left AKA on 06/17/2024 by Dr. Lanis.  He has hx of right AKA 10/31/2022 also by Dr. Lanis.  -pt doing well and incision looks good.  She does not have any swelling in the stump. -all staples removed today and she tolerated this well.  -she will f/u as needed.  She knows  to call if she has any issues.    Lucie Apt, [facility] [phone]   Clinic MD:  Lanis
